# Patient Record
Sex: Female | Born: 1935
Health system: Southern US, Community
[De-identification: ages and names within clinical notes are randomized; demographics above are authoritative.]

## PROBLEM LIST (undated history)

## (undated) DIAGNOSIS — I639 Cerebral infarction, unspecified: Secondary | ICD-10-CM

## (undated) DIAGNOSIS — Z8673 Personal history of transient ischemic attack (TIA), and cerebral infarction without residual deficits: Secondary | ICD-10-CM

## (undated) DIAGNOSIS — E7800 Pure hypercholesterolemia, unspecified: Secondary | ICD-10-CM

## (undated) DIAGNOSIS — I10 Essential (primary) hypertension: Secondary | ICD-10-CM

## (undated) DIAGNOSIS — D0510 Intraductal carcinoma in situ of unspecified breast: Secondary | ICD-10-CM

## (undated) DIAGNOSIS — K219 Gastro-esophageal reflux disease without esophagitis: Secondary | ICD-10-CM

## (undated) HISTORY — DX: Personal history of transient ischemic attack (TIA), and cerebral infarction without residual deficits: Z86.73

## (undated) HISTORY — PX: ABDOMINAL HYSTERECTOMY: SHX81

## (undated) HISTORY — PX: BREAST LUMPECTOMY: SHX2

## (undated) HISTORY — DX: Intraductal carcinoma in situ of unspecified breast: D05.10

---

## 2001-06-15 ENCOUNTER — Encounter (HOSPITAL_COMMUNITY): Admission: RE | Admit: 2001-06-15 | Discharge: 2001-07-15 | Payer: Self-pay | Admitting: Oncology

## 2001-06-15 ENCOUNTER — Encounter: Admission: RE | Admit: 2001-06-15 | Discharge: 2001-06-15 | Payer: Self-pay | Admitting: Oncology

## 2001-10-25 ENCOUNTER — Other Ambulatory Visit: Admission: RE | Admit: 2001-10-25 | Discharge: 2001-10-25 | Payer: Self-pay | Admitting: Family Medicine

## 2001-10-27 ENCOUNTER — Ambulatory Visit (HOSPITAL_COMMUNITY): Admission: RE | Admit: 2001-10-27 | Discharge: 2001-10-27 | Payer: Self-pay | Admitting: Family Medicine

## 2001-10-27 ENCOUNTER — Encounter: Payer: Self-pay | Admitting: Family Medicine

## 2002-04-26 ENCOUNTER — Ambulatory Visit (HOSPITAL_COMMUNITY): Admission: RE | Admit: 2002-04-26 | Discharge: 2002-04-26 | Payer: Self-pay | Admitting: Ophthalmology

## 2002-05-17 ENCOUNTER — Ambulatory Visit (HOSPITAL_COMMUNITY): Admission: RE | Admit: 2002-05-17 | Discharge: 2002-05-17 | Payer: Self-pay | Admitting: Ophthalmology

## 2002-06-13 ENCOUNTER — Encounter: Admission: RE | Admit: 2002-06-13 | Discharge: 2002-06-13 | Payer: Self-pay | Admitting: Oncology

## 2002-06-13 ENCOUNTER — Encounter (HOSPITAL_COMMUNITY): Admission: RE | Admit: 2002-06-13 | Discharge: 2002-07-13 | Payer: Self-pay | Admitting: Oncology

## 2002-12-06 ENCOUNTER — Ambulatory Visit (HOSPITAL_COMMUNITY): Admission: RE | Admit: 2002-12-06 | Discharge: 2002-12-06 | Payer: Self-pay | Admitting: Family Medicine

## 2002-12-06 ENCOUNTER — Encounter: Payer: Self-pay | Admitting: Family Medicine

## 2003-01-05 ENCOUNTER — Encounter: Payer: Self-pay | Admitting: Family Medicine

## 2003-01-05 ENCOUNTER — Ambulatory Visit (HOSPITAL_COMMUNITY): Admission: RE | Admit: 2003-01-05 | Discharge: 2003-01-05 | Payer: Self-pay | Admitting: Family Medicine

## 2003-06-02 ENCOUNTER — Encounter (HOSPITAL_COMMUNITY): Admission: RE | Admit: 2003-06-02 | Discharge: 2003-07-02 | Payer: Self-pay | Admitting: Oncology

## 2003-06-02 ENCOUNTER — Encounter: Admission: RE | Admit: 2003-06-02 | Discharge: 2003-06-02 | Payer: Self-pay | Admitting: Oncology

## 2003-12-11 ENCOUNTER — Ambulatory Visit (HOSPITAL_COMMUNITY): Admission: RE | Admit: 2003-12-11 | Discharge: 2003-12-11 | Payer: Self-pay | Admitting: Family Medicine

## 2004-04-29 ENCOUNTER — Ambulatory Visit (HOSPITAL_COMMUNITY): Admission: RE | Admit: 2004-04-29 | Discharge: 2004-04-29 | Payer: Self-pay | Admitting: Family Medicine

## 2004-06-19 ENCOUNTER — Encounter: Admission: RE | Admit: 2004-06-19 | Discharge: 2004-07-19 | Payer: Self-pay | Admitting: Oncology

## 2004-06-19 ENCOUNTER — Encounter (HOSPITAL_COMMUNITY): Admission: RE | Admit: 2004-06-19 | Discharge: 2004-07-19 | Payer: Self-pay | Admitting: Oncology

## 2005-01-13 ENCOUNTER — Ambulatory Visit (HOSPITAL_COMMUNITY): Admission: RE | Admit: 2005-01-13 | Discharge: 2005-01-13 | Payer: Self-pay | Admitting: Family Medicine

## 2005-06-18 ENCOUNTER — Encounter (HOSPITAL_COMMUNITY): Admission: RE | Admit: 2005-06-18 | Discharge: 2005-07-18 | Payer: Self-pay | Admitting: Oncology

## 2005-06-18 ENCOUNTER — Encounter: Admission: RE | Admit: 2005-06-18 | Discharge: 2005-07-18 | Payer: Self-pay | Admitting: Oncology

## 2005-06-18 ENCOUNTER — Ambulatory Visit (HOSPITAL_COMMUNITY): Payer: Self-pay | Admitting: Oncology

## 2006-01-16 ENCOUNTER — Ambulatory Visit (HOSPITAL_COMMUNITY): Admission: RE | Admit: 2006-01-16 | Discharge: 2006-01-16 | Payer: Self-pay | Admitting: Family Medicine

## 2006-06-17 ENCOUNTER — Encounter: Admission: RE | Admit: 2006-06-17 | Discharge: 2006-07-17 | Payer: Self-pay | Admitting: Oncology

## 2006-06-17 ENCOUNTER — Ambulatory Visit (HOSPITAL_COMMUNITY): Payer: Self-pay | Admitting: Oncology

## 2007-02-02 ENCOUNTER — Encounter (HOSPITAL_COMMUNITY): Admission: RE | Admit: 2007-02-02 | Discharge: 2007-03-04 | Payer: Self-pay | Admitting: Oncology

## 2007-06-16 ENCOUNTER — Ambulatory Visit (HOSPITAL_COMMUNITY): Payer: Self-pay | Admitting: Oncology

## 2008-02-03 ENCOUNTER — Ambulatory Visit (HOSPITAL_COMMUNITY): Admission: RE | Admit: 2008-02-03 | Discharge: 2008-02-03 | Payer: Self-pay | Admitting: Family Medicine

## 2008-06-09 ENCOUNTER — Ambulatory Visit (HOSPITAL_COMMUNITY): Payer: Self-pay | Admitting: Oncology

## 2008-12-29 ENCOUNTER — Ambulatory Visit (HOSPITAL_COMMUNITY): Admission: RE | Admit: 2008-12-29 | Discharge: 2008-12-29 | Payer: Self-pay | Admitting: Family Medicine

## 2009-03-05 ENCOUNTER — Ambulatory Visit (HOSPITAL_COMMUNITY): Admission: RE | Admit: 2009-03-05 | Discharge: 2009-03-05 | Payer: Self-pay | Admitting: Family Medicine

## 2009-03-08 ENCOUNTER — Ambulatory Visit (HOSPITAL_COMMUNITY): Admission: RE | Admit: 2009-03-08 | Discharge: 2009-03-08 | Payer: Self-pay | Admitting: Family Medicine

## 2009-06-08 ENCOUNTER — Ambulatory Visit (HOSPITAL_COMMUNITY): Payer: Self-pay | Admitting: Oncology

## 2009-06-08 ENCOUNTER — Encounter (HOSPITAL_COMMUNITY): Admission: RE | Admit: 2009-06-08 | Discharge: 2009-07-08 | Payer: Self-pay | Admitting: Oncology

## 2010-03-26 ENCOUNTER — Ambulatory Visit (HOSPITAL_COMMUNITY): Admission: RE | Admit: 2010-03-26 | Discharge: 2010-03-26 | Payer: Self-pay | Admitting: Family Medicine

## 2010-06-07 ENCOUNTER — Ambulatory Visit (HOSPITAL_COMMUNITY): Payer: Self-pay | Admitting: Oncology

## 2010-11-11 ENCOUNTER — Encounter: Payer: Self-pay | Admitting: Family Medicine

## 2011-02-18 ENCOUNTER — Other Ambulatory Visit (HOSPITAL_COMMUNITY): Payer: Self-pay | Admitting: Family Medicine

## 2011-02-18 ENCOUNTER — Ambulatory Visit (HOSPITAL_COMMUNITY)
Admission: RE | Admit: 2011-02-18 | Discharge: 2011-02-18 | Disposition: A | Discharge status: Home / Self Care | Payer: Medicare Other | Source: Ambulatory Visit | Attending: Family Medicine | Admitting: Family Medicine

## 2011-02-18 DIAGNOSIS — R531 Weakness: Secondary | ICD-10-CM

## 2011-02-18 DIAGNOSIS — G319 Degenerative disease of nervous system, unspecified: Secondary | ICD-10-CM | POA: Insufficient documentation

## 2011-02-18 DIAGNOSIS — Z853 Personal history of malignant neoplasm of breast: Secondary | ICD-10-CM | POA: Insufficient documentation

## 2011-02-18 DIAGNOSIS — R209 Unspecified disturbances of skin sensation: Secondary | ICD-10-CM | POA: Insufficient documentation

## 2011-02-18 DIAGNOSIS — E119 Type 2 diabetes mellitus without complications: Secondary | ICD-10-CM | POA: Insufficient documentation

## 2011-02-19 ENCOUNTER — Ambulatory Visit (HOSPITAL_COMMUNITY)
Admission: RE | Admit: 2011-02-19 | Discharge: 2011-02-19 | Disposition: A | Discharge status: Home / Self Care | Payer: Medicare Other | Source: Ambulatory Visit | Attending: Family Medicine | Admitting: Family Medicine

## 2011-02-19 ENCOUNTER — Other Ambulatory Visit (HOSPITAL_COMMUNITY): Payer: Self-pay | Admitting: Family Medicine

## 2011-02-19 DIAGNOSIS — G459 Transient cerebral ischemic attack, unspecified: Secondary | ICD-10-CM

## 2011-02-19 DIAGNOSIS — E119 Type 2 diabetes mellitus without complications: Secondary | ICD-10-CM | POA: Insufficient documentation

## 2011-04-29 ENCOUNTER — Emergency Department (HOSPITAL_COMMUNITY): Payer: Medicare Other

## 2011-04-29 ENCOUNTER — Other Ambulatory Visit: Payer: Self-pay

## 2011-04-29 ENCOUNTER — Emergency Department (HOSPITAL_COMMUNITY)
Admission: EM | Admit: 2011-04-29 | Discharge: 2011-04-29 | Disposition: A | Discharge status: Home / Self Care | Payer: Medicare Other | Attending: Emergency Medicine | Admitting: Emergency Medicine

## 2011-04-29 ENCOUNTER — Encounter: Payer: Self-pay | Admitting: *Deleted

## 2011-04-29 DIAGNOSIS — I10 Essential (primary) hypertension: Secondary | ICD-10-CM | POA: Insufficient documentation

## 2011-04-29 DIAGNOSIS — E119 Type 2 diabetes mellitus without complications: Secondary | ICD-10-CM | POA: Insufficient documentation

## 2011-04-29 DIAGNOSIS — N390 Urinary tract infection, site not specified: Secondary | ICD-10-CM | POA: Insufficient documentation

## 2011-04-29 HISTORY — DX: Essential (primary) hypertension: I10

## 2011-04-29 HISTORY — DX: Gastro-esophageal reflux disease without esophagitis: K21.9

## 2011-04-29 HISTORY — DX: Pure hypercholesterolemia, unspecified: E78.00

## 2011-04-29 LAB — BASIC METABOLIC PANEL
Creatinine, Ser: 1.03 mg/dL (ref 0.50–1.10)
GFR calc Af Amer: >60 mL/min (ref >60)
Glucose, Bld: 161 mg/dL — ABNORMAL HIGH (ref 70–99)
Sodium: 137 mEq/L (ref 135–145)

## 2011-04-29 LAB — URINE MICROSCOPIC-ADD ON

## 2011-04-29 LAB — CBC
HCT: 36.2 % (ref 36.0–46.0)
MCHC: 33.7 g/dL (ref 30.0–36.0)
MCV: 87.7 fL (ref 78.0–100.0)
RDW: 13.3 % (ref 11.5–15.5)

## 2011-04-29 LAB — URINALYSIS, ROUTINE W REFLEX MICROSCOPIC
Hgb urine dipstick: NEGATIVE
Protein, ur: NEGATIVE mg/dL

## 2011-04-29 LAB — CK TOTAL AND CKMB (NOT AT ARMC): CK, MB: 2 ng/mL (ref 0.3–4.0)

## 2011-04-29 LAB — TROPONIN I: Troponin I: <0.3 ng/mL (ref <0.30)

## 2011-04-29 MED ORDER — DEXTROSE 5 % IV SOLN
INTRAVENOUS | Status: AC
  Filled 2011-04-29: qty 1

## 2011-04-29 MED ORDER — CEPHALEXIN 500 MG PO CAPS: 500.0000 mg | ORAL_CAPSULE | Freq: Four times a day (QID) | ORAL | Status: AC

## 2011-04-29 MED ORDER — DEXTROSE 5 % IV SOLN
1.0000 g | Freq: Once | INTRAVENOUS | Status: AC
  Administered 2011-04-29: 1 g via INTRAVENOUS
  Filled 2011-04-29: qty 1

## 2011-04-29 NOTE — ED Notes (Signed)
Pt reports waking this a.m with a "haze" over both eyes.  Pt states that "things look darker than usual".  Pt also reports some dizziness.  Pt is alert & oriented x 4, denies any weakness in extremities.  Neuro exam was WNL.

## 2011-04-29 NOTE — ED Provider Notes (Signed)
History     Chief Complaint  Patient presents with  . Fatigue    c/o generalized weakness and dimming of vision onset this morning upon waking   The history is provided by the patient. No language interpreter was used.  Referred to ED by Dr. Renard Matter. Patient c/o fatigue and weakness with associated numbness and blurred vision onset this AM after awaking and gradually worsening throughout the day today. States she awoke this morning "not feeling right" and with "foggy vision" but notes vision has gradually improved throughout the day and is near normal at this time. States she felt fine before going to bed last night. Notes nothing aggravates or relieves fatigue/weakness. Patient was seen at Dr. Renard Matter' office today and had blood glucose measure of 133, hemoglobin- 13.3 and BP-130/80.  Patient reports h/o stroke and diabetes. Reports she is not currently on any blood thinners. Denies chest pain, SOB, vomiting, fever, diaphoresis, neck/back pain, falls, abdominal pain Patient reports she has had about 3 episodes similar to this in the past several months  Patient seen at 4:54PM Past Medical History  Diagnosis Date  . Hypertension   . Diabetes mellitus   . GERD (gastroesophageal reflux disease)   . High cholesterol     Past Surgical History  Procedure Date  . Abdominal hysterectomy   . Cesarean section   . Breast lumpectomy     No family history on file.  History  Substance Use Topics  . Smoking status: Never Smoker   . Smokeless tobacco: Not on file  . Alcohol Use: No    OB History    Grav Para Term Preterm Abortions TAB SAB Ect Mult Living                  Review of Systems  Constitutional: Positive for fatigue. Negative for fever and chills.  HENT: Negative for neck pain.   Eyes: Positive for visual disturbance. Negative for pain.  Respiratory: Negative for shortness of breath.   Cardiovascular: Negative for chest pain.  Gastrointestinal: Negative for abdominal pain.   Musculoskeletal: Negative for back pain.  Neurological: Positive for weakness and numbness.  All other systems reviewed and are negative.    Physical Exam  BP 186/68  Pulse 83  Temp(Src) 98.5 F (36.9 C) (Oral)  Resp 20  Ht 5\' 5"  (1.651 m)  Wt 180 lb (81.647 kg)  BMI 29.95 kg/m2  SpO2 80%  Physical Exam CONSTITUTIONAL: Well developed/well nourished HEAD AND FACE: Normocephalic/atraumatic EYES: EOMI/PERRL, conjunctiva normal, peripheral vision intact ENMT: Mucous membranes moist NECK: supple no meningeal signs SPINE:entire spine nontender CV: S1/S2 noted, no murmurs/rubs/gallops noted LUNGS: Lungs are clear to auscultation bilaterally, no apparent distress ABDOMEN: soft, nontender, no rebound or guarding GU:no cva tenderness NEURO: Pt is awake/alert, moves all extremitiesx4, no facial droop, no pronator drift, face is symmetric, no arm or leg drift is noted Cranial nerves 3/4/5/6/04/27/09/11/12 tested and intact EXTREMITIES: pulses normal-DP and PT, full ROM SKIN: warm, color normal PSYCH: no abnormalities of mood noted  ED Course  Procedures Recheck-6:30 PM Patient with no new complaints. Labs and imaging results reviewed with patient.  MDM Nursing notes reviewed and considered in documentation All labs/vitals reviewed and considered xrays reviewed and considered EKG: normal sinus rhythm, nonspecific ST and T waves changes, left axis deviation, rate 80, time at 1737.  Dr Sudie Bailey here and saw patient We offered pt admission and further workup but she refuses.  She will f/u with dr Renard Matter She reports she can  take PCN, rocephin/keflex given She is well appearing, no focal motor deficits, she reports she is ambulatory  I personally performed the services described in this documentation, which was scribed in my presence. The recorded information has been reviewed and considered.      Joya Gaskins, MD 04/30/11 [phone]

## 2011-04-29 NOTE — ED Notes (Signed)
[id number] 

## 2011-04-29 NOTE — ED Notes (Signed)
C/o generalized weakness and darkening of her vision onset upon waking this morning; was seen by Dr. Renard Matter and sent to ED for further evaluation

## 2011-04-30 NOTE — Progress Notes (Signed)
NAME:  ARTHURINE, OLEARY                   ACCOUNT NO.:  MEDICAL RECORD NO.:  [id number]  LOCATION:                                 FACILITY:  PHYSICIAN:  Mila Homer. Sudie Bailey, M.D.DATE OF BIRTH:  February 03, 1933  DATE OF PROCEDURE: DATE OF DISCHARGE:                                PROGRESS NOTE   SUBJECTIVE:  This 77 year old patient of Dr. Butch Penny developed feelings of decreased vision with things going dark in front of her eyes as well as generalized weakness starting this morning.  She was seen by Dr. Renard Matter in the office and then transferred to the ER.  She has had episodes of this two or three times.  Each time it was resolved.  Workup so far has been negative.  She currently lives by herself.  Her husband died a couple years ago. She does have a history of strokes.  She has five children, most of whom live close by.  By the time she came to the emergency department she was already feeling stronger.  OBJECTIVE:  VITAL SIGNS:  Temperature is 98.5, pulse 84, respiratory rate 20, blood pressure was 141/47 and then rechecked 186/68, then 149/69.  The O2 sat was 98% on room air but was 80% initially. GENERAL:  She was at, the time of my exam, supine in bed.  She was well- developed, well-nourished, oriented and alert.  Her speech was normal and sentence structure intact.  She had two daughters in the room with her. HEART:  Regular rhythm, rate of 70. LUNGS:  Clear throughout. ABDOMEN:  Soft without organomegaly or mass. BACK:  She had some tenderness on the left CVA but not the right CVA. EXTREMITIES:  There was no edema of the ankles.  Her admission white cell count was 8800, hemoglobin 12.2, BUN 19, creatinine 1.03, CK-MB of 2.0 and total CK of 66 with troponin less than 0.30.  Platelet count 205,000.  Recheck glucose was 161.  Her urinalysis showed negative nitrite, small leukocytes but 11 to 20 WBCs per HPF and many bacteria.  CT of the head without contrast showed  mild atrophy and chronic ischemic white matter disease with multiple old infarcts.  She had no acute intracranial abnormality.  She had no change in these findings compared to her prior CT, which was May 2012.  Chest x-ray showed no acute abnormality.  Further review of the record showed carotid ultrasound showing minimal patchy right carotid bifurcation, calcific atherosclerosis in the right carotid artery, but there was no hemodynamically significant right ICA stenosis.  There were similar findings in the left carotid artery.  This study was done May 2012.  ASSESSMENT: 1. Urinary tract infection. 2. Weakness, uncertain etiology. 3. History of multiple strokes. 4. Diabetes.  PLAN:  She is getting a gram of Rocephin today and then will be on cephalexin p.o. after that.  She will follow up with Dr. Renard Matter.  She may need further workup including cardiac echo, or neurology workup given some of her symptoms.     Mila Homer. Sudie Bailey, M.D.     SDK/MEDQ  D:  04/29/2011  T:  04/30/2011  Job:  [id number]

## 2011-06-05 ENCOUNTER — Other Ambulatory Visit (HOSPITAL_COMMUNITY): Payer: Self-pay | Admitting: Family Medicine

## 2011-06-05 DIAGNOSIS — Z139 Encounter for screening, unspecified: Secondary | ICD-10-CM

## 2011-06-06 ENCOUNTER — Ambulatory Visit (HOSPITAL_COMMUNITY): Payer: Self-pay | Admitting: Oncology

## 2011-06-12 ENCOUNTER — Ambulatory Visit (HOSPITAL_COMMUNITY)
Admission: RE | Admit: 2011-06-12 | Discharge: 2011-06-12 | Disposition: A | Discharge status: Home / Self Care | Payer: Medicare Other | Source: Ambulatory Visit | Attending: Family Medicine | Admitting: Family Medicine

## 2011-06-12 DIAGNOSIS — Z139 Encounter for screening, unspecified: Secondary | ICD-10-CM

## 2011-06-12 DIAGNOSIS — Z1231 Encounter for screening mammogram for malignant neoplasm of breast: Secondary | ICD-10-CM | POA: Insufficient documentation

## 2011-06-16 ENCOUNTER — Encounter (HOSPITAL_COMMUNITY): Payer: Self-pay | Admitting: Oncology

## 2011-06-16 ENCOUNTER — Encounter (HOSPITAL_COMMUNITY): Payer: Medicare Other | Attending: Oncology | Admitting: Oncology

## 2011-06-16 VITALS — BP 168/81 | HR 82 | Temp 97.9°F | Wt 184.2 lb

## 2011-06-16 DIAGNOSIS — C50919 Malignant neoplasm of unspecified site of unspecified female breast: Secondary | ICD-10-CM

## 2011-06-16 DIAGNOSIS — F329 Major depressive disorder, single episode, unspecified: Secondary | ICD-10-CM

## 2011-06-16 DIAGNOSIS — F411 Generalized anxiety disorder: Secondary | ICD-10-CM

## 2011-06-16 NOTE — Progress Notes (Signed)
This office note has been dictated.

## 2011-06-16 NOTE — Patient Instructions (Signed)
[facility]  Discharge Instructions  RECOMMENDATIONS MADE BY THE CONSULTANT AND ANY TEST RESULTS WILL BE SENT TO YOUR REFERRING DOCTOR.   EXAM FINDINGS BY MD TODAY AND SIGNS AND SYMPTOMS TO REPORT TO CLINIC OR PRIMARY MD:  You must get out and walk everyday that there is nice weather. Please get engaged with friends. Please eat healthy foods. We want you moving and eating healthy. If you are not feeling better mentally/emotionally in 6-8 weeks then you must call Dr. Mariel Sleet or Dr. Renard Matter and let us know. Please tell Dr. Renard Matter how you are feeling like you told Dr. Mariel Sleet today. We will otherwise see you in 1 year.    I acknowledge that I have been informed and understand all the instructions given to me and received a copy. I do not have any more questions at this time, but understand that I may call the Specialty Clinic at [facility] at [phone] during business hours should I have any further questions or need assistance in obtaining follow-up care.    __________________________________________  _____________  __________ Signature of Patient or Authorized Representative            Date                   Time    __________________________________________ Nurse's Signature

## 2011-08-01 NOTE — Progress Notes (Signed)
CC:   Angus G. Renard Matter, MD Barbaraann Barthel, M.D. [facility] Dr. Antony Blackbird  DIAGNOSES: 1. Ductal carcinoma in situ of the left breast, status post lumpectomy     followed by radiation therapy in December 1997.  Thus far without     recurrence. 2. Depression, reactive, secondary to the death of her husband 03-14-2010. 3. Obesity, weighing 184 pounds now. 4. Diabetes mellitus. 5. Mild peripheral neuropathy from her diabetes. 6. Hypercholesterolemia. 7. Degenerative joint disease. 8. Hypertension. 9. Two transient ischemic attacks in the past.  [place] is still living alone.  She gets out so rarely.  When she does leave the house and drive, she states the most the time she gets anxious.  She states she had to take her nerve pill just before coming here.  She maybe goes out once a week if she has to.  She likes to avoid going out.  She does not have any hobbies, does not have any really good friends that she visits on a regular basis or that visit her.  She has been to church 3 times, she states, since her husband died and she used to be a regular attendee.  She has gained 4 pounds.  She sits and watches TV almost all day.  She falls asleep watching TV.  She states she still gets 6 or 7 hours of sleep at night.  She is not necessarily constipated per se.  She does not have a lot of energy, though, and she talks about if she tried to garden she might not be able to get up because of her knees and hips being stiff.  She is very sad-looking at times and she started to cry when we talked about her husband.  She has 5 children, all of whom talk to her but not always on a regular basis.  One son comes and cuts her grass every 03-15-23 and he talks to her, of course.  She refuses to go to the beach on vacation with any of the children.  She does not really want to go on vacation.  She does not want to go visit them and does not go visit them for the most  part except one of her sons who has a bread route, and she will go see him once in awhile.  She is basically cooking foods that are either pre-prepared or canned. Does not cook anymore.  Her sole objective in the past was taking care of her husband and they were married for many, many years, over 47, and they were best friends and so this has been very, very difficult for her.  Her oncologic review of systems is noncontributory.  PHYSICAL EXAMINATION:  Vital signs:  Her weight is up 4 pounds to 184 pounds.  Her blood pressure is slightly high at 168/81.  She is afebrile, pulse 80 and regular, respiratory rate 16 unlabored.  General: She is in no acute distress but she does looks sad.  She has no lymphadenopathy.  Both breasts are negative for masses.  Lungs:  Clear to auscultation and percussion.  Heart:  A regular rhythm and rate without murmur, rub or gallop.  Abdomen:  Soft, nontender, without organomegaly or masses, and there is no arm or leg edema.  I have asked her to talk things over with Dr. Renard Matter, but right now I would like to just see how she does without an antidepressant for about 6-8 weeks and if she is  not feeling any better and not getting out and doing more, she promises to call.  Otherwise, we will see her in a year but we spent 45 minutes going over counseling and coordination of care, etc., and will see her back in a year but she promises to call.  She had some lab work in July which was excellent.  I do not think I need to repeat that.  She states she is going to see Dr. Renard Matter on September 5, and he will have my note by then as well.    ______________________________ Ladona Horns. Mariel Sleet, MD ESN/MEDQ  D:  06/16/2011  T:  06/16/2011  Job:  [id number]

## 2012-06-11 ENCOUNTER — Ambulatory Visit (HOSPITAL_COMMUNITY): Payer: Medicare Other | Admitting: Oncology

## 2012-06-23 ENCOUNTER — Encounter (HOSPITAL_COMMUNITY): Payer: Medicare Other | Attending: Oncology | Admitting: Oncology

## 2012-06-23 ENCOUNTER — Encounter (HOSPITAL_COMMUNITY): Payer: Self-pay | Admitting: Oncology

## 2012-06-23 VITALS — BP 109/68 | HR 80 | Temp 97.9°F | Resp 16 | Ht 63.78 in | Wt 166.0 lb

## 2012-06-23 DIAGNOSIS — E119 Type 2 diabetes mellitus without complications: Secondary | ICD-10-CM | POA: Insufficient documentation

## 2012-06-23 DIAGNOSIS — F0390 Unspecified dementia without behavioral disturbance: Secondary | ICD-10-CM | POA: Insufficient documentation

## 2012-06-23 DIAGNOSIS — Z853 Personal history of malignant neoplasm of breast: Secondary | ICD-10-CM | POA: Insufficient documentation

## 2012-06-23 DIAGNOSIS — F4321 Adjustment disorder with depressed mood: Secondary | ICD-10-CM

## 2012-06-23 DIAGNOSIS — I10 Essential (primary) hypertension: Secondary | ICD-10-CM | POA: Insufficient documentation

## 2012-06-23 DIAGNOSIS — R413 Other amnesia: Secondary | ICD-10-CM

## 2012-06-23 DIAGNOSIS — Z09 Encounter for follow-up examination after completed treatment for conditions other than malignant neoplasm: Secondary | ICD-10-CM | POA: Insufficient documentation

## 2012-06-23 LAB — COMPREHENSIVE METABOLIC PANEL
ALT: 15 U/L (ref 0–35)
AST: 17 U/L (ref 0–37)
Alkaline Phosphatase: 63 U/L (ref 39–117)
CO2: 24 mEq/L (ref 19–32)
GFR calc Af Amer: 59 mL/min — ABNORMAL LOW (ref >90)
Glucose, Bld: 176 mg/dL — ABNORMAL HIGH (ref 70–99)
Potassium: 4.8 mEq/L (ref 3.5–5.1)
Sodium: 134 mEq/L — ABNORMAL LOW (ref 135–145)
Total Protein: 6.9 g/dL (ref 6.0–8.3)

## 2012-06-23 LAB — FOLATE: Folate: 17 ng/mL

## 2012-06-23 LAB — CBC
Hemoglobin: 11.7 g/dL — ABNORMAL LOW (ref 12.0–15.0)
MCHC: 34 g/dL (ref 30.0–36.0)
Platelets: 207 10*3/uL (ref 150–400)
RBC: 4 MIL/uL (ref 3.87–5.11)

## 2012-06-23 LAB — DIFFERENTIAL
Eosinophils Absolute: 0.2 10*3/uL (ref 0.0–0.7)
Eosinophils Relative: 3 % (ref 0–5)
Lymphocytes Relative: 38 % (ref 12–46)
Lymphs Abs: 2.6 10*3/uL (ref 0.7–4.0)
Monocytes Absolute: 0.5 10*3/uL (ref 0.1–1.0)

## 2012-06-23 LAB — VITAMIN B12: Vitamin B-12: 302 pg/mL (ref 211–911)

## 2012-06-23 NOTE — Progress Notes (Signed)
Problem #1 DCIS of the left breast status post lumpectomy followed by radiation therapy in December 1997 thus far without recurrence Problem #2 dementia Problem #3 reactive depression secondary to the death of her husband in June 04, 2011Problem #4 obesity in the past so she has lost from 184 pounds one year ago to 166 pounds presently Problem #5 diabetes mellitus Problem #6 history of mild peripheral neuropathy felt to be secondary to her diabetes Problem #7 hypercholesterolemia Problem #8 hypertension Problem #9 Two TIAs in the past Problem #10 DJD This lady is here today with her grandson who is now living with her. Her recent memory is tremendously different. I don't know if she has small vessel disease of the brain but she cannot remember many things whatsoever. She did know the president after a couple minutes. She did not note either of our [place] Senators, she cannot do serial sevens past 93, she cannot multiply 9x7 nor 5x4. She cannot remember 3 objects 5 minutes after our discussion.  Other than that she denies any lumps or bumps anywhere etc. She is still driving. She states she does not get lost. She states she can remember things from her past very easily. Vital signs except for the weight are stable. Lymph nodes are negative throughout. Breast exam is negative bilaterally for masses. Her lungs are clear. HEENT exam is unremarkable. Abdomen soft and nontender without organomegaly. Heart shows a regular rhythm and rate without murmur rub or gallop. She has no leg edema no arm edema. Bowel sounds are normal.  We will get some blood work today and get her to see Dr. Gerilyn Pilgrim consultation as soon as possible. I'm not sure she understands how serious this memory loss is. She makes light of this issue. Otherwise we will see her in one year

## 2012-06-23 NOTE — Patient Instructions (Addendum)
Rebecca Hardy  DOB 1932-12-30 CSN [id number]  MRN [id number] Dr. Glenford Peers   [facility]  Discharge Instructions  RECOMMENDATIONS MADE BY THE CONSULTANT AND ANY TEST RESULTS WILL BE SENT TO YOUR REFERRING DOCTOR.   EXAM FINDINGS BY MD TODAY AND SIGNS AND SYMPTOMS TO REPORT TO CLINIC OR PRIMARY MD: Exam and discussion by Dr. Mariel Sleet.  No evidence of recurrence by exam.  Need to check some blood work to see if there is anything that can account for your short-term memory loss.  We will also get you a consult to see a Neurologist about your memory loss.  MEDICATIONS PRESCRIBED: none   INSTRUCTIONS GIVEN AND DISCUSSED: Other :  Report any new lumps, bone pain or shortness of breath.  SPECIAL INSTRUCTIONS/FOLLOW-UP: Lab work Needed today and Return to Clinic in 1 year to see MD and referral to neurologist.   I acknowledge that I have been informed and understand all the instructions given to me and received a copy. I do not have any more questions at this time, but understand that I may call the Specialty Clinic at [facility] at [phone] during business hours should I have any further questions or need assistance in obtaining follow-up care.    __________________________________________  _____________  __________ Signature of Patient or Authorized Representative            Date                   Time    __________________________________________ Nurse's Signature

## 2012-07-12 ENCOUNTER — Other Ambulatory Visit: Payer: Self-pay | Admitting: Neurology

## 2012-07-12 ENCOUNTER — Other Ambulatory Visit (HOSPITAL_COMMUNITY): Payer: Self-pay | Admitting: Family Medicine

## 2012-07-12 DIAGNOSIS — Z139 Encounter for screening, unspecified: Secondary | ICD-10-CM

## 2012-07-15 ENCOUNTER — Ambulatory Visit (HOSPITAL_COMMUNITY): Payer: Medicare Other

## 2012-07-22 ENCOUNTER — Ambulatory Visit (HOSPITAL_COMMUNITY)
Admission: RE | Admit: 2012-07-22 | Discharge: 2012-07-22 | Disposition: A | Discharge status: Home / Self Care | Payer: Medicare Other | Source: Ambulatory Visit | Attending: Family Medicine | Admitting: Family Medicine

## 2012-07-22 DIAGNOSIS — Z139 Encounter for screening, unspecified: Secondary | ICD-10-CM

## 2012-07-22 DIAGNOSIS — Z1231 Encounter for screening mammogram for malignant neoplasm of breast: Secondary | ICD-10-CM | POA: Insufficient documentation

## 2013-04-04 ENCOUNTER — Other Ambulatory Visit (HOSPITAL_COMMUNITY): Payer: Self-pay | Admitting: Family Medicine

## 2013-04-04 DIAGNOSIS — R52 Pain, unspecified: Secondary | ICD-10-CM

## 2013-04-13 ENCOUNTER — Other Ambulatory Visit (HOSPITAL_COMMUNITY): Payer: Self-pay | Admitting: Family Medicine

## 2013-04-13 ENCOUNTER — Ambulatory Visit (HOSPITAL_COMMUNITY)
Admission: RE | Admit: 2013-04-13 | Discharge: 2013-04-13 | Disposition: A | Discharge status: Home / Self Care | Payer: Medicare Other | Source: Ambulatory Visit | Attending: Family Medicine | Admitting: Family Medicine

## 2013-04-13 DIAGNOSIS — R52 Pain, unspecified: Secondary | ICD-10-CM

## 2013-04-13 DIAGNOSIS — Z9889 Other specified postprocedural states: Secondary | ICD-10-CM

## 2013-04-13 DIAGNOSIS — N644 Mastodynia: Secondary | ICD-10-CM | POA: Insufficient documentation

## 2013-04-13 DIAGNOSIS — Z853 Personal history of malignant neoplasm of breast: Secondary | ICD-10-CM

## 2013-06-23 ENCOUNTER — Ambulatory Visit (HOSPITAL_COMMUNITY): Payer: Medicare Other | Admitting: Oncology

## 2013-07-01 ENCOUNTER — Ambulatory Visit (HOSPITAL_COMMUNITY): Payer: Medicare Other | Admitting: Oncology

## 2015-04-13 ENCOUNTER — Emergency Department (HOSPITAL_COMMUNITY)
Admission: EM | Admit: 2015-04-13 | Discharge: 2015-04-14 | Disposition: A | Discharge status: Home / Self Care | Payer: Medicare Other | Attending: Emergency Medicine | Admitting: Emergency Medicine

## 2015-04-13 ENCOUNTER — Encounter (HOSPITAL_COMMUNITY): Payer: Self-pay | Admitting: Emergency Medicine

## 2015-04-13 ENCOUNTER — Emergency Department (HOSPITAL_COMMUNITY): Payer: Medicare Other

## 2015-04-13 DIAGNOSIS — W19XXXA Unspecified fall, initial encounter: Secondary | ICD-10-CM

## 2015-04-13 DIAGNOSIS — S3992XA Unspecified injury of lower back, initial encounter: Secondary | ICD-10-CM | POA: Diagnosis present

## 2015-04-13 DIAGNOSIS — I10 Essential (primary) hypertension: Secondary | ICD-10-CM | POA: Insufficient documentation

## 2015-04-13 DIAGNOSIS — Z7982 Long term (current) use of aspirin: Secondary | ICD-10-CM | POA: Insufficient documentation

## 2015-04-13 DIAGNOSIS — Z8673 Personal history of transient ischemic attack (TIA), and cerebral infarction without residual deficits: Secondary | ICD-10-CM | POA: Diagnosis not present

## 2015-04-13 DIAGNOSIS — Y9301 Activity, walking, marching and hiking: Secondary | ICD-10-CM | POA: Diagnosis not present

## 2015-04-13 DIAGNOSIS — Y998 Other external cause status: Secondary | ICD-10-CM | POA: Diagnosis not present

## 2015-04-13 DIAGNOSIS — Z79899 Other long term (current) drug therapy: Secondary | ICD-10-CM | POA: Diagnosis not present

## 2015-04-13 DIAGNOSIS — E1165 Type 2 diabetes mellitus with hyperglycemia: Secondary | ICD-10-CM | POA: Insufficient documentation

## 2015-04-13 DIAGNOSIS — Z880 Allergy status to penicillin: Secondary | ICD-10-CM | POA: Insufficient documentation

## 2015-04-13 DIAGNOSIS — K219 Gastro-esophageal reflux disease without esophagitis: Secondary | ICD-10-CM | POA: Diagnosis not present

## 2015-04-13 DIAGNOSIS — Y92002 Bathroom of unspecified non-institutional (private) residence single-family (private) house as the place of occurrence of the external cause: Secondary | ICD-10-CM | POA: Diagnosis not present

## 2015-04-13 DIAGNOSIS — R739 Hyperglycemia, unspecified: Secondary | ICD-10-CM

## 2015-04-13 DIAGNOSIS — Z86018 Personal history of other benign neoplasm: Secondary | ICD-10-CM | POA: Insufficient documentation

## 2015-04-13 DIAGNOSIS — W1839XA Other fall on same level, initial encounter: Secondary | ICD-10-CM | POA: Diagnosis not present

## 2015-04-13 LAB — CBC WITH DIFFERENTIAL/PLATELET
BASOS PCT: 0 % (ref 0–1)
Basophils Absolute: 0 10*3/uL (ref 0.0–0.1)
EOS ABS: 0.3 10*3/uL (ref 0.0–0.7)
EOS PCT: 3 % (ref 0–5)
HEMATOCRIT: 37.9 % (ref 36.0–46.0)
HEMOGLOBIN: 12.9 g/dL (ref 12.0–15.0)
Lymphocytes Relative: 38 % (ref 12–46)
Lymphs Abs: 3.1 10*3/uL (ref 0.7–4.0)
MCH: 29.9 pg (ref 26.0–34.0)
MCHC: 34 g/dL (ref 30.0–36.0)
MCV: 87.7 fL (ref 78.0–100.0)
MONO ABS: 0.7 10*3/uL (ref 0.1–1.0)
MONOS PCT: 9 % (ref 3–12)
Neutro Abs: 4 10*3/uL (ref 1.7–7.7)
Neutrophils Relative %: 50 % (ref 43–77)
Platelets: 162 10*3/uL (ref 150–400)
RBC: 4.32 MIL/uL (ref 3.87–5.11)
RDW: 12.7 % (ref 11.5–15.5)
WBC: 8.1 10*3/uL (ref 4.0–10.5)

## 2015-04-13 LAB — BASIC METABOLIC PANEL
Anion gap: 10 (ref 5–15)
BUN: 20 mg/dL (ref 6–20)
CO2: 25 mmol/L (ref 22–32)
Calcium: 9 mg/dL (ref 8.9–10.3)
Chloride: 100 mmol/L — ABNORMAL LOW (ref 101–111)
Creatinine, Ser: 1.13 mg/dL — ABNORMAL HIGH (ref 0.44–1.00)
GFR calc Af Amer: 52 mL/min — ABNORMAL LOW (ref >60)
GFR, EST NON AFRICAN AMERICAN: 45 mL/min — AB (ref >60)
Glucose, Bld: 402 mg/dL — ABNORMAL HIGH (ref 65–99)
Potassium: 4 mmol/L (ref 3.5–5.1)
SODIUM: 135 mmol/L (ref 135–145)

## 2015-04-13 LAB — CBG MONITORING, ED: Glucose-Capillary: 247 mg/dL — ABNORMAL HIGH (ref 65–99)

## 2015-04-13 MED ORDER — METFORMIN HCL 500 MG PO TABS
1000.0000 mg | ORAL_TABLET | Freq: Once | ORAL | Status: AC
  Administered 2015-04-13: 1000 mg via ORAL
  Filled 2015-04-13: qty 2

## 2015-04-13 MED ORDER — METFORMIN HCL ER 500 MG PO TB24
ORAL_TABLET | ORAL | Status: AC
  Filled 2015-04-13: qty 2

## 2015-04-13 MED ORDER — INSULIN ASPART 100 UNIT/ML ~~LOC~~ SOLN
8.0000 [IU] | Freq: Once | SUBCUTANEOUS | Status: AC
  Administered 2015-04-13: 8 [IU] via SUBCUTANEOUS
  Filled 2015-04-13: qty 1

## 2015-04-13 MED ORDER — GLIPIZIDE-METFORMIN HCL 5-500 MG PO TABS: 2.0000 | ORAL_TABLET | Freq: Two times a day (BID) | ORAL | Status: DC

## 2015-04-13 MED ORDER — GLIPIZIDE 5 MG PO TABS
ORAL_TABLET | ORAL | Status: AC
  Filled 2015-04-13: qty 2

## 2015-04-13 MED ORDER — FENTANYL CITRATE (PF) 100 MCG/2ML IJ SOLN
50.0000 ug | Freq: Once | INTRAMUSCULAR | Status: AC
  Administered 2015-04-13: 50 ug via INTRAVENOUS
  Filled 2015-04-13: qty 2

## 2015-04-13 MED ORDER — SODIUM CHLORIDE 0.9 % IV SOLN
INTRAVENOUS | Status: DC
  Administered 2015-04-13: 20:00:00 via INTRAVENOUS

## 2015-04-13 MED ORDER — GLIPIZIDE 10 MG PO TABS
10.0000 mg | ORAL_TABLET | Freq: Every day | ORAL | Status: DC
  Administered 2015-04-13: 10 mg via ORAL

## 2015-04-13 NOTE — Discharge Instructions (Signed)
Fall Prevention and Home Safety Falls cause injuries and can affect all age groups. It is possible to use preventive measures to significantly decrease the likelihood of falls. There are many simple measures which can make your home safer and prevent falls. OUTDOORS  Repair cracks and edges of walkways and driveways.  Remove high doorway thresholds.  Trim shrubbery on the main path into your home.  Have good outside lighting.  Clear walkways of tools, rocks, debris, and clutter.  Check that handrails are not broken and are securely fastened. Both sides of steps should have handrails.  Have leaves, snow, and ice cleared regularly.  Use sand or salt on walkways during winter months.  In the garage, clean up grease or oil spills. BATHROOM  Install night lights.  Install grab bars by the toilet and in the tub and shower.  Use non-skid mats or decals in the tub or shower.  Place a plastic non-slip stool in the shower to sit on, if needed.  Keep floors dry and clean up all water on the floor immediately.  Remove soap buildup in the tub or shower on a regular basis.  Secure bath mats with non-slip, double-sided rug tape.  Remove throw rugs and tripping hazards from the floors. BEDROOMS  Install night lights.  Make sure a bedside light is easy to reach.  Do not use oversized bedding.  Keep a telephone by your bedside.  Have a firm chair with side arms to use for getting dressed.  Remove throw rugs and tripping hazards from the floor. KITCHEN  Keep handles on pots and pans turned toward the center of the stove. Use back burners when possible.  Clean up spills quickly and allow time for drying.  Avoid walking on wet floors.  Avoid hot utensils and knives.  Position shelves so they are not too high or low.  Place commonly used objects within easy reach.  If necessary, use a sturdy step stool with a grab bar when reaching.  Keep electrical cables out of the  way.  Do not use floor polish or wax that makes floors slippery. If you must use wax, use non-skid floor wax.  Remove throw rugs and tripping hazards from the floor. STAIRWAYS  Never leave objects on stairs.  Place handrails on both sides of stairways and use them. Fix any loose handrails. Make sure handrails on both sides of the stairways are as long as the stairs.  Check carpeting to make sure it is firmly attached along stairs. Make repairs to worn or loose carpet promptly.  Avoid placing throw rugs at the top or bottom of stairways, or properly secure the rug with carpet tape to prevent slippage. Get rid of throw rugs, if possible.  Have an electrician put in a light switch at the top and bottom of the stairs. OTHER FALL PREVENTION TIPS  Wear low-heel or rubber-soled shoes that are supportive and fit well. Wear closed toe shoes.  When using a stepladder, make sure it is fully opened and both spreaders are firmly locked. Do not climb a closed stepladder.  Add color or contrast paint or tape to grab bars and handrails in your home. Place contrasting color strips on first and last steps.  Learn and use mobility aids as needed. Install an electrical emergency response system.  Turn on lights to avoid dark areas. Replace light bulbs that burn out immediately. Get light switches that glow.  Arrange furniture to create clear pathways. Keep furniture in the same place.  Firmly attach carpet with non-skid or double-sided tape.  Eliminate uneven floor surfaces.  Select a carpet pattern that does not visually hide the edge of steps.  Be aware of all pets. OTHER HOME SAFETY TIPS  Set the water temperature for 120 F (48.8 C).  Keep emergency numbers on or near the telephone.  Keep smoke detectors on every level of the home and near sleeping areas. Document Released: 09/26/2002 Document Revised: 04/06/2012 Document Reviewed: 12/26/2011 [facility] Patient Information 2015  [place], [place]. This information is not intended to replace advice given to you by your health care provider. Make sure you discuss any questions you have with your health care provider.  Hyperglycemia Hyperglycemia occurs when the glucose (sugar) in your blood is too high. Hyperglycemia can happen for many reasons, but it most often happens to people who do not know they have diabetes or are not managing their diabetes properly.  CAUSES  Whether you have diabetes or not, there are other causes of hyperglycemia. Hyperglycemia can occur when you have diabetes, but it can also occur in other situations that you might not be as aware of, such as: Diabetes  If you have diabetes and are having problems controlling your blood glucose, hyperglycemia could occur because of some of the following reasons:  Not following your meal plan.  Not taking your diabetes medications or not taking it properly.  Exercising less or doing less activity than you normally do.  Being sick. Pre-diabetes  This cannot be ignored. Before people develop Type 2 diabetes, they almost always have "pre-diabetes." This is when your blood glucose levels are higher than normal, but not yet high enough to be diagnosed as diabetes. Research has shown that some long-term damage to the body, especially the heart and circulatory system, may already be occurring during pre-diabetes. If you take action to manage your blood glucose when you have pre-diabetes, you may delay or prevent Type 2 diabetes from developing. Stress  If you have diabetes, you may be "diet" controlled or on oral medications or insulin to control your diabetes. However, you may find that your blood glucose is higher than usual in the hospital whether you have diabetes or not. This is often referred to as "stress hyperglycemia." Stress can elevate your blood glucose. This happens because of hormones put out by the body during times of stress. If stress has been the cause  of your high blood glucose, it can be followed regularly by your caregiver. That way he/she can make sure your hyperglycemia does not continue to get worse or progress to diabetes. Steroids  Steroids are medications that act on the infection fighting system (immune system) to block inflammation or infection. One side effect can be a rise in blood glucose. Most people can produce enough extra insulin to allow for this rise, but for those who cannot, steroids make blood glucose levels go even higher. It is not unusual for steroid treatments to "uncover" diabetes that is developing. It is not always possible to determine if the hyperglycemia will go away after the steroids are stopped. A special blood test called an A1c is sometimes done to determine if your blood glucose was elevated before the steroids were started. SYMPTOMS  Thirsty.  Frequent urination.  Dry mouth.  Blurred vision.  Tired or fatigue.  Weakness.  Sleepy.  Tingling in feet or leg. DIAGNOSIS  Diagnosis is made by monitoring blood glucose in one or all of the following ways:  A1c test. This is a chemical  found in your blood.  Fingerstick blood glucose monitoring.  Laboratory results. TREATMENT  First, knowing the cause of the hyperglycemia is important before the hyperglycemia can be treated. Treatment may include, but is not be limited to:  Education.  Change or adjustment in medications.  Change or adjustment in meal plan.  Treatment for an illness, infection, etc.  More frequent blood glucose monitoring.  Change in exercise plan.  Decreasing or stopping steroids.  Lifestyle changes. HOME CARE INSTRUCTIONS   Test your blood glucose as directed.  Exercise regularly. Your caregiver will give you instructions about exercise. Pre-diabetes or diabetes which comes on with stress is helped by exercising.  Eat wholesome, balanced meals. Eat often and at regular, fixed times. Your caregiver or nutritionist  will give you a meal plan to guide your sugar intake.  Being at an ideal weight is important. If needed, losing as little as 10 to 15 pounds may help improve blood glucose levels. SEEK MEDICAL CARE IF:   You have questions about medicine, activity, or diet.  You continue to have symptoms (problems such as increased thirst, urination, or weight gain). SEEK IMMEDIATE MEDICAL CARE IF:   You are vomiting or have diarrhea.  Your breath smells fruity.  You are breathing faster or slower.  You are very sleepy or incoherent.  You have numbness, tingling, or pain in your feet or hands.  You have chest pain.  Your symptoms get worse even though you have been following your caregiver's orders.  If you have any other questions or concerns. Document Released: 04/01/2001 Document Revised: 12/29/2011 Document Reviewed: 02/02/2012 [facility] Patient Information 2015 [place], [place]. This information is not intended to replace advice given to you by your health care provider. Make sure you discuss any questions you have with your health care provider.

## 2015-04-13 NOTE — ED Notes (Signed)
Patient reports fell last night. Complaining of pain to tailbone. States trouble getting up and walking.

## 2015-04-13 NOTE — ED Provider Notes (Signed)
CSN: [id number]     Arrival date & time 04/13/15  1919 History   First MD Initiated Contact with Patient 04/13/15 1927     Chief Complaint  Patient presents with  . Fall  . Tailbone Pain    HPI Patient presents to the emergency room with complaints of pain in her tailbone. The patient was walking last evening to the bathroom. Her legs became unsteady and she fell to the ground. Patient landed on her tailbone. Since that time she has had persistent pain. It is hard for her to get up and walk although she is able to do so and bear weight. She did not hit her head. She did not lose consciousness. She denies any trouble with chest pain or shortness of breath. She denies any numbness or weakness. Patient has not had any vomiting or diarrhea. No fevers or chills. She has not taken anything for the pain. Past Medical History  Diagnosis Date  . Hypertension   . Diabetes mellitus   . GERD (gastroesophageal reflux disease)   . High cholesterol   . DCIS (ductal carcinoma in situ) of breast     left breast  . History of TIA (transient ischemic attack)    Past Surgical History  Procedure Laterality Date  . Abdominal hysterectomy    . Cesarean section    . Breast lumpectomy     Family History  Problem Relation Age of Onset  . Cancer Mother   . Hypertension Father    History  Substance Use Topics  . Smoking status: Never Smoker   . Smokeless tobacco: Never Used  . Alcohol Use: No   OB History    No data available     Review of Systems  All other systems reviewed and are negative.     Allergies  Codeine and Penicillins  Home Medications   Prior to Admission medications   Medication Sig Start Date End Date Taking? Authorizing Provider  acetaminophen (TYLENOL) 650 MG CR tablet Take 650 mg by mouth every 8 (eight) hours as needed.    Historical Provider, MD  aspirin 81 MG tablet Take 81 mg by mouth once a week.     Historical Provider, MD  diazepam (VALIUM) 5 MG tablet Take 5 mg  by mouth every 6 (six) hours as needed.      Historical Provider, MD  ezetimibe-simvastatin (VYTORIN) 10-40 MG per tablet Take 1 tablet by mouth at bedtime.      Historical Provider, MD  glipiZIDE-metformin (METAGLIP) 5-500 MG per tablet Take 2 tablets by mouth 2 (two) times daily before a meal.      Historical Provider, MD  Liraglutide (VICTOZA [place]) Inject into the skin daily.      Historical Provider, MD  lisinopril (PRINIVIL,ZESTRIL) 10 MG tablet Take 10 mg by mouth daily.      Historical Provider, MD  omeprazole (PRILOSEC) 20 MG capsule Take 20 mg by mouth daily.     Historical Provider, MD  triamterene-hydrochlorothiazide (MAXZIDE) 75-50 MG per tablet Take 1 tablet by mouth as needed.     Historical Provider, MD   BP 116/60 mmHg  Pulse 77  Temp(Src) 98.7 F (37.1 C) (Oral)  Resp 13  Ht 5\' 5"  (1.651 m)  Wt 162 lb (73.483 kg)  BMI 26.96 kg/m2  SpO2 97% Physical Exam  Constitutional: No distress.  HENT:  Head: Normocephalic and atraumatic.  Right Ear: External ear normal.  Left Ear: External ear normal.  Eyes: Conjunctivae are normal. Right eye  exhibits no discharge. Left eye exhibits no discharge. No scleral icterus.  Neck: Neck supple. No tracheal deviation present.  Cardiovascular: Normal rate, regular rhythm and intact distal pulses.   Pulmonary/Chest: Effort normal and breath sounds normal. No stridor. No respiratory distress. She has no wheezes. She has no rales.  Abdominal: Soft. Bowel sounds are normal. She exhibits no distension. There is no tenderness. There is no rebound and no guarding.  Musculoskeletal: She exhibits no edema.       Right hip: Normal.       Left hip: Normal.       Cervical back: Normal.       Thoracic back: Normal.       Lumbar back: She exhibits tenderness and bony tenderness.  Tenderness palpation in the middle of the sacrum  Neurological: She is alert. She has normal strength. No cranial nerve deficit (no facial droop, extraocular movements intact,  no slurred speech) or sensory deficit. She exhibits normal muscle tone. She displays no seizure activity. Coordination normal.  5 out of 5 strength bilateral upper extremities and lower extremities, normal sensation, no facial droop  Skin: Skin is warm and dry. No rash noted.  Psychiatric: She has a normal mood and affect.  Nursing note and vitals reviewed.   ED Course  Procedures (including critical care time) Labs Review Labs Reviewed  BASIC METABOLIC PANEL - Abnormal; Notable for the following:    Chloride 100 (*)    Glucose, Bld 402 (*)    Creatinine, Ser 1.13 (*)    GFR calc non Af Amer 45 (*)    GFR calc Af Amer 52 (*)    All other components within normal limits  CBC WITH DIFFERENTIAL/PLATELET    Imaging Review Ct Pelvis Limited W/o Cm  04/13/2015   CLINICAL DATA:  Patient reports fall last night on the heart surface. Pain to tail bone.  EXAM: CT PELVIS WITHOUT CONTRAST  TECHNIQUE: Multidetector CT imaging of the pelvis was performed following the standard protocol without intravenous contrast.  COMPARISON:  None.  FINDINGS: The bones appear diffusely osteopenic. This diminishes ability to detect subtle nondisplaced fractures. There is mild facet degenerative change involving the lower lumbar spine. Mild lumbar scoliosis deformity is also noted. There is no displaced fractures identified. Both hips appear located and intact.  The urinary bladder appears normal. Aortic atherosclerosis noted. No enlarged pelvic lymph nodes. No free fluid identified. The pelvic bowel loops are unremarkable.  IMPRESSION: 1. No displaced fractures identified. 2. Osteopenia.   Electronically Signed   By: Kerby Moors M.D.   On: 04/13/2015 21:46     EKG Interpretation   Date/Time:  Friday April 13 2015 20:23:36 EDT Ventricular Rate:  77 PR Interval:  184 QRS Duration: 89 QT Interval:  401 QTC Calculation: 220 R Axis:   -21 Text Interpretation:  Sinus rhythm Borderline left axis deviation Low   voltage, extremity and precordial leads No significant change since last  tracing Confirmed by Oswin Johal  MD-J, Delorice Bannister ([id number]) on 04/13/2015 8:42:00 PM      MDM   Final diagnoses:  Fall  Hyperglycemia    Pt has not take her new diabetes medication in a week.  She was being switched from metalip to a new one but she could not afford the rx.  Will give fluids and insulin.  Giver her a dose of metaglip.  Have her follow up with her PCP to review her blood sugars.    Dorie Rank, MD 04/13/15 2213

## 2015-04-14 MED ORDER — TRAMADOL HCL 50 MG PO TABS: 50.0000 mg | ORAL_TABLET | Freq: Four times a day (QID) | ORAL | Status: DC | PRN

## 2015-04-14 NOTE — ED Provider Notes (Signed)
Family had left discharge instructions in the room. They called back to return to pick up the instructions but also state that the patient is having significant amount of pain and with like a pain prescription for her. It is noted that she is allergic to codeine, so she is given a prescription for tramadol.  Delora Fuel, MD 22/29/79 [id number]

## 2015-12-17 ENCOUNTER — Other Ambulatory Visit (HOSPITAL_COMMUNITY): Payer: Self-pay | Admitting: Internal Medicine

## 2015-12-17 DIAGNOSIS — R413 Other amnesia: Secondary | ICD-10-CM

## 2015-12-20 ENCOUNTER — Ambulatory Visit (HOSPITAL_COMMUNITY): Admission: RE | Admit: 2015-12-20 | Payer: Medicare Other | Source: Ambulatory Visit

## 2016-01-01 ENCOUNTER — Ambulatory Visit (HOSPITAL_COMMUNITY)
Admission: RE | Admit: 2016-01-01 | Discharge: 2016-01-01 | Disposition: A | Discharge status: Home / Self Care | Payer: Medicare Other | Source: Ambulatory Visit | Attending: Internal Medicine | Admitting: Internal Medicine

## 2016-01-01 DIAGNOSIS — F0390 Unspecified dementia without behavioral disturbance: Secondary | ICD-10-CM | POA: Diagnosis present

## 2016-01-01 DIAGNOSIS — R413 Other amnesia: Secondary | ICD-10-CM

## 2016-01-01 DIAGNOSIS — Z8673 Personal history of transient ischemic attack (TIA), and cerebral infarction without residual deficits: Secondary | ICD-10-CM | POA: Insufficient documentation

## 2016-01-01 DIAGNOSIS — R9082 White matter disease, unspecified: Secondary | ICD-10-CM | POA: Diagnosis not present

## 2016-01-01 DIAGNOSIS — G311 Senile degeneration of brain, not elsewhere classified: Secondary | ICD-10-CM | POA: Insufficient documentation

## 2016-05-21 ENCOUNTER — Encounter (HOSPITAL_COMMUNITY): Payer: Self-pay | Admitting: Emergency Medicine

## 2016-05-21 ENCOUNTER — Emergency Department (HOSPITAL_COMMUNITY)
Admission: EM | Admit: 2016-05-21 | Discharge: 2016-05-22 | Disposition: A | Discharge status: Home / Self Care | Payer: Medicare Other | Attending: Emergency Medicine | Admitting: Emergency Medicine

## 2016-05-21 ENCOUNTER — Emergency Department (HOSPITAL_COMMUNITY): Payer: Medicare Other

## 2016-05-21 DIAGNOSIS — N390 Urinary tract infection, site not specified: Secondary | ICD-10-CM

## 2016-05-21 DIAGNOSIS — E1165 Type 2 diabetes mellitus with hyperglycemia: Secondary | ICD-10-CM | POA: Insufficient documentation

## 2016-05-21 DIAGNOSIS — Z79899 Other long term (current) drug therapy: Secondary | ICD-10-CM | POA: Insufficient documentation

## 2016-05-21 DIAGNOSIS — Z794 Long term (current) use of insulin: Secondary | ICD-10-CM | POA: Diagnosis not present

## 2016-05-21 DIAGNOSIS — I10 Essential (primary) hypertension: Secondary | ICD-10-CM | POA: Diagnosis not present

## 2016-05-21 DIAGNOSIS — R531 Weakness: Secondary | ICD-10-CM | POA: Diagnosis not present

## 2016-05-21 DIAGNOSIS — R739 Hyperglycemia, unspecified: Secondary | ICD-10-CM

## 2016-05-21 LAB — CBC
HCT: 32.2 % — ABNORMAL LOW (ref 36.0–46.0)
HEMOGLOBIN: 11.1 g/dL — AB (ref 12.0–15.0)
MCH: 30.5 pg (ref 26.0–34.0)
MCHC: 34.5 g/dL (ref 30.0–36.0)
MCV: 88.5 fL (ref 78.0–100.0)
Platelets: 129 10*3/uL — ABNORMAL LOW (ref 150–400)
RBC: 3.64 MIL/uL — AB (ref 3.87–5.11)
RDW: 13.2 % (ref 11.5–15.5)
WBC: 6.7 10*3/uL (ref 4.0–10.5)

## 2016-05-21 LAB — BASIC METABOLIC PANEL
Anion gap: 6 (ref 5–15)
BUN: 30 mg/dL — ABNORMAL HIGH (ref 6–20)
CO2: 25 mmol/L (ref 22–32)
Calcium: 8.3 mg/dL — ABNORMAL LOW (ref 8.9–10.3)
Chloride: 99 mmol/L — ABNORMAL LOW (ref 101–111)
Creatinine, Ser: 1.49 mg/dL — ABNORMAL HIGH (ref 0.44–1.00)
GFR calc Af Amer: 37 mL/min — ABNORMAL LOW (ref >60)
GFR calc non Af Amer: 32 mL/min — ABNORMAL LOW (ref >60)
Glucose, Bld: 432 mg/dL — ABNORMAL HIGH (ref 65–99)
POTASSIUM: 4.1 mmol/L (ref 3.5–5.1)
SODIUM: 130 mmol/L — AB (ref 135–145)

## 2016-05-21 LAB — CBG MONITORING, ED: Glucose-Capillary: 433 mg/dL — ABNORMAL HIGH (ref 65–99)

## 2016-05-21 MED ORDER — INSULIN ASPART 100 UNIT/ML ~~LOC~~ SOLN
8.0000 [IU] | Freq: Once | SUBCUTANEOUS | Status: AC
  Administered 2016-05-21: 8 [IU] via SUBCUTANEOUS
  Filled 2016-05-21: qty 1

## 2016-05-21 MED ORDER — SODIUM CHLORIDE 0.9 % IV BOLUS (SEPSIS)
500.0000 mL | Freq: Once | INTRAVENOUS | Status: AC
  Administered 2016-05-21: 500 mL via INTRAVENOUS

## 2016-05-21 NOTE — ED Provider Notes (Signed)
[facility] DEPT Provider Note   CSN: [id number] Arrival date & time: 05/21/16  2157  First Provider Contact:  None   By signing my name below, I, Rebecca Hardy, attest that this documentation has been prepared under the direction and in the presence of No att. providers found . Electronically Signed: Dyke Hardy, Scribe. 05/22/2016. 9:16 AM.   History   Chief Complaint Chief Complaint  Patient presents with  . Hyperglycemia    HPI Rebecca Hardy is a 82 y.o. female wth PMHx of DMwho presents to the Emergency Department complaining of hyperglycemia onset tonight. Pt states her blood sugar was 433 tonight during routine check. Pt states she did not take Trujeo tonight. When asked if she takes her medication regularly, pt  "sometimes I forget" . Per family, pt had  Denies vomiting, nausea, diarrhea, couch, runny nose, sore throat, SOB chest pain, ab pain, fever, hematuria, dysuria, headache, or eye pain. Pt denies any PMHx of MI, heart failure, asthma, or COPD. Per daughter, her blood sugar typically runs any where from 150-220. She notes associated lightheadedness which has resolved and blurred vision.   Legs are baseline swollen and red HPI  Past Medical History:  Diagnosis Date  . 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 Diabetes mellitus   . GERD (gastroesophageal reflux disease)   . High cholesterol   . History of TIA (transient ischemic attack)   . Hypertension     There are no active problems to display for this patient.   Past Surgical History:  Procedure Laterality Date  . ABDOMINAL HYSTERECTOMY    . BREAST LUMPECTOMY    . CESAREAN SECTION      OB History    No data available       Home Medications    Prior to Admission medications   Medication Sig Start Date End Date Taking? Authorizing Provider  acetaminophen (TYLENOL) 650 MG CR tablet Take 650 mg by mouth every 8 (eight) hours as needed for pain.    Yes Historical Provider, MD    ezetimibe-simvastatin (VYTORIN) 10-40 MG per tablet Take 1 tablet by mouth at bedtime.     Yes Historical Provider, MD  furosemide (LASIX) 20 MG tablet Take 20 mg by mouth daily.   Yes Historical Provider, MD  lisinopril (PRINIVIL,ZESTRIL) 10 MG tablet Take 10 mg by mouth daily.     Yes Historical Provider, MD  sertraline (ZOLOFT) 25 MG tablet Take 25 mg by mouth daily. 03/27/15  Yes Historical Provider, MD  TOUJEO SOLOSTAR 300 UNIT/ML SOPN Inject 8-20 Units into the skin 2 (two) times daily. 20 units in the morning and 8 units at bedtime 05/21/16  Yes Historical Provider, MD  cephALEXin (KEFLEX) 500 MG capsule Take 1 capsule (500 mg total) by mouth 2 (two) times daily. 05/22/16   Ezequiel Essex, MD  pioglitazone (ACTOS) 15 MG tablet Take 15 mg by mouth daily.  05/19/16   Historical Provider, MD    Family History Family History  Problem Relation Age of Onset  . Cancer Mother   . Hypertension Father     Social History Social History  Substance Use Topics  . Smoking status: Never Smoker  . Smokeless tobacco: Never Used  . Alcohol use No     Allergies   Codeine and Penicillins   Review of Systems Review of Systems 10 Systems reviewed and are negative for acute change except as noted in the HPI.  Physical Exam Updated Vital  Signs BP (!) 104/35 (BP Location: Right Arm)   Pulse 80   Temp 99 F (37.2 C) (Oral)   Resp 17   Ht 5\' 5"  (1.651 m)   Wt 165 lb (74.8 kg)   SpO2 96%   BMI 27.46 kg/m   Physical Exam  Constitutional: She is oriented to person, place, and time. She appears well-developed and well-nourished. No distress.  HENT:  Head: Normocephalic and atraumatic.  Mouth/Throat: Oropharynx is clear and moist. Mucous membranes are dry. No oropharyngeal exudate.  Eyes: Conjunctivae and EOM are normal. Pupils are equal, round, and reactive to light.  Neck: Normal range of motion. Neck supple.  No meningismus.  Cardiovascular: Normal rate, regular rhythm, normal heart sounds  and intact distal pulses.   No murmur heard. Pulmonary/Chest: Effort normal and breath sounds normal. No respiratory distress.  Dry cough  Abdominal: Soft. There is no tenderness. There is no rebound and no guarding.  Musculoskeletal: Normal range of motion. She exhibits no edema or tenderness.  Trace pedal edema bilaterally  Neurological: She is alert and oriented to person, place, and time. No cranial nerve deficit. She exhibits normal muscle tone. Coordination normal.   5/5 strength throughout. CN 2-12 intact.Equal grip strength. No nystagmus.   Skin: Skin is warm.  Psychiatric: She has a normal mood and affect. Her behavior is normal.  Nursing note and vitals reviewed.   ED Treatments / Results  Labs (all labs ordered are listed, but only abnormal results are displayed) Labs Reviewed  BASIC METABOLIC PANEL - Abnormal; Notable for the following:       Result Value   Sodium 130 (*)    Chloride 99 (*)    Glucose, Bld 432 (*)    BUN 30 (*)    Creatinine, Ser 1.49 (*)    Calcium 8.3 (*)    GFR calc non Af Amer 32 (*)    GFR calc Af Amer 37 (*)    All other components within normal limits  CBC - Abnormal; Notable for the following:    RBC 3.64 (*)    Hemoglobin 11.1 (*)    HCT 32.2 (*)    Platelets 129 (*)    All other components within normal limits  URINALYSIS, ROUTINE W REFLEX MICROSCOPIC (NOT AT [facility]) - Abnormal; Notable for the following:    Color, Urine STRAW (*)    Specific Gravity, Urine <1.005 (*)    Glucose, UA 100 (*)    Leukocytes, UA SMALL (*)    All other components within normal limits  URINE MICROSCOPIC-ADD ON - Abnormal; Notable for the following:    Squamous Epithelial / LPF 6-30 (*)    Bacteria, UA MANY (*)    All other components within normal limits  CBG MONITORING, ED - Abnormal; Notable for the following:    Glucose-Capillary 433 (*)    All other components within normal limits  CBG MONITORING, ED - Abnormal; Notable for the following:     Glucose-Capillary 206 (*)    All other components within normal limits  CBG MONITORING, ED - Abnormal; Notable for the following:    Glucose-Capillary 111 (*)    All other components within normal limits  CBG MONITORING, ED - Abnormal; Notable for the following:    Glucose-Capillary 120 (*)    All other components within normal limits  URINE CULTURE    EKG  EKG Interpretation  Date/Time:  Wednesday May 21 2016 23:41:54 EDT Ventricular Rate:  72 PR Interval:  QRS Duration: 93 QT Interval:  433 QTC Calculation: 474 R Axis:   -8 Text Interpretation:  Sinus rhythm Low voltage, extremity and precordial leads No significant change was found Confirmed by Wyvonnia Dusky  MD, Nikoletta Varma [phone]) on 05/21/2016 11:46:15 PM       Radiology Dg Chest 2 View  Result Date: 05/22/2016 CLINICAL DATA:  82 year old female with weakness EXAM: CHEST  2 VIEW COMPARISON:  Chest radiograph dated 06/13/2013 FINDINGS: Two views of the chest do not demonstrate a focal consolidation. There is no pleural effusion or pneumothorax. The cardiac silhouette is within normal limits. There is osteopenia with degenerative changes of the spine. No acute fracture. IMPRESSION: No active cardiopulmonary disease. Electronically Signed   By: Anner Crete M.D.   On: 05/22/2016 00:11    Procedures Procedures (including critical care time)  Medications Ordered in ED Medications  sodium chloride 0.9 % bolus 500 mL (0 mLs Intravenous Stopped 05/22/16 0046)  insulin aspart (novoLOG) injection 8 Units (8 Units Subcutaneous Given 05/21/16 2342)  sodium chloride 0.9 % bolus 500 mL (0 mLs Intravenous Stopped 05/22/16 0226)     Initial Impression / Assessment and Plan / ED Course  I have reviewed the triage vital signs and the nursing notes.  Pertinent labs & imaging results that were available during my care of the patient were reviewed by me and considered in my medical decision making (see chart for details).  Clinical Course    Comment By Time  Labs show hyperglycemia without DKA. Patient will be given IV fluids and IV insulin. Ezequiel Essex, MD 08/02 2338  Orthostatics negative Ezequiel Essex, MD 08/03 [phone]   Patient from home with hyperglycemia. Denies dizziness contrary to triage note. No chest pain or SOB.  Hyperglycemia without DKA. IVF and insulin given.  Blood sugar improved to 120. Labs also with AKI. She is tolerating PO and ambulatory. Denies complaint.  Advised to keep log of blood sugars and followup closely with PCP. Treat possible UTI.  Return precautions discussed.  I personally performed the services described in this documentation, which was scribed in my presence. The recorded information has been reviewed and is accurate.   Final Clinical Impressions(s) / ED Diagnoses   Final diagnoses:  Hyperglycemia  Urinary tract infection without hematuria, site unspecified    New Prescriptions Discharge Medication List as of 05/22/2016  3:18 AM    START taking these medications   Details  cephALEXin (KEFLEX) 500 MG capsule Take 1 capsule (500 mg total) by mouth 2 (two) times daily., Starting Thu 05/22/2016, Print         Ezequiel Essex, MD 05/22/16 [phone]

## 2016-05-21 NOTE — ED Triage Notes (Signed)
Pt c/o dizziness and states her blood sugar was in the 500s.

## 2016-05-22 DIAGNOSIS — R531 Weakness: Secondary | ICD-10-CM | POA: Diagnosis not present

## 2016-05-22 LAB — CBG MONITORING, ED
GLUCOSE-CAPILLARY: 111 mg/dL — AB (ref 65–99)
Glucose-Capillary: 206 mg/dL — ABNORMAL HIGH (ref 65–99)
Glucose-Capillary: 120 mg/dL — ABNORMAL HIGH (ref 65–99)

## 2016-05-22 LAB — URINALYSIS, ROUTINE W REFLEX MICROSCOPIC
Bilirubin Urine: NEGATIVE
Glucose, UA: 100 mg/dL — AB
HGB URINE DIPSTICK: NEGATIVE
Ketones, ur: NEGATIVE mg/dL
Nitrite: NEGATIVE
PROTEIN: NEGATIVE mg/dL
pH: 5.5 (ref 5.0–8.0)

## 2016-05-22 LAB — URINE MICROSCOPIC-ADD ON

## 2016-05-22 MED ORDER — SODIUM CHLORIDE 0.9 % IV BOLUS (SEPSIS)
500.0000 mL | Freq: Once | INTRAVENOUS | Status: AC
  Administered 2016-05-22: 500 mL via INTRAVENOUS

## 2016-05-22 MED ORDER — CEPHALEXIN 500 MG PO CAPS: 500.0000 mg | ORAL_CAPSULE | Freq: Two times a day (BID) | ORAL | 0 refills | Status: DC

## 2016-05-22 NOTE — ED Notes (Signed)
cbg of 111.

## 2016-05-22 NOTE — ED Notes (Signed)
Patient walked to the bathroom with minimal assistance. Patient denies short of breath and dizziness. She stated that her feet hurts.

## 2016-05-22 NOTE — Discharge Instructions (Signed)
Monitor your blood sugars and followup with your doctor. Return to the ED if you develop new or worsening symptoms.

## 2016-05-23 LAB — URINE CULTURE

## 2016-06-09 DIAGNOSIS — E119 Type 2 diabetes mellitus without complications: Secondary | ICD-10-CM | POA: Diagnosis not present

## 2016-06-09 DIAGNOSIS — Z794 Long term (current) use of insulin: Secondary | ICD-10-CM | POA: Diagnosis not present

## 2016-07-11 DIAGNOSIS — B351 Tinea unguium: Secondary | ICD-10-CM | POA: Diagnosis not present

## 2016-07-11 DIAGNOSIS — L851 Acquired keratosis [keratoderma] palmaris et plantaris: Secondary | ICD-10-CM | POA: Diagnosis not present

## 2016-07-11 DIAGNOSIS — E1142 Type 2 diabetes mellitus with diabetic polyneuropathy: Secondary | ICD-10-CM | POA: Diagnosis not present

## 2016-07-29 DIAGNOSIS — Z794 Long term (current) use of insulin: Secondary | ICD-10-CM | POA: Diagnosis not present

## 2016-07-29 DIAGNOSIS — E119 Type 2 diabetes mellitus without complications: Secondary | ICD-10-CM | POA: Diagnosis not present

## 2016-08-29 DIAGNOSIS — E119 Type 2 diabetes mellitus without complications: Secondary | ICD-10-CM | POA: Diagnosis not present

## 2016-08-29 DIAGNOSIS — I10 Essential (primary) hypertension: Secondary | ICD-10-CM | POA: Diagnosis not present

## 2016-08-29 DIAGNOSIS — E785 Hyperlipidemia, unspecified: Secondary | ICD-10-CM | POA: Diagnosis not present

## 2016-08-29 DIAGNOSIS — Z6829 Body mass index (BMI) 29.0-29.9, adult: Secondary | ICD-10-CM | POA: Diagnosis not present

## 2016-09-09 DIAGNOSIS — E119 Type 2 diabetes mellitus without complications: Secondary | ICD-10-CM | POA: Diagnosis not present

## 2016-09-09 DIAGNOSIS — E785 Hyperlipidemia, unspecified: Secondary | ICD-10-CM | POA: Diagnosis not present

## 2016-09-09 DIAGNOSIS — I10 Essential (primary) hypertension: Secondary | ICD-10-CM | POA: Diagnosis not present

## 2016-09-11 ENCOUNTER — Emergency Department (HOSPITAL_COMMUNITY)
Admission: EM | Admit: 2016-09-11 | Discharge: 2016-09-11 | Disposition: A | Discharge status: Home / Self Care | Payer: Medicare Other | Attending: Emergency Medicine | Admitting: Emergency Medicine

## 2016-09-11 ENCOUNTER — Emergency Department (HOSPITAL_COMMUNITY): Payer: Medicare Other

## 2016-09-11 ENCOUNTER — Encounter (HOSPITAL_COMMUNITY): Payer: Self-pay | Admitting: *Deleted

## 2016-09-11 DIAGNOSIS — M25562 Pain in left knee: Secondary | ICD-10-CM | POA: Diagnosis not present

## 2016-09-11 DIAGNOSIS — S99912A Unspecified injury of left ankle, initial encounter: Secondary | ICD-10-CM | POA: Diagnosis present

## 2016-09-11 DIAGNOSIS — M1991 Primary osteoarthritis, unspecified site: Secondary | ICD-10-CM | POA: Diagnosis not present

## 2016-09-11 DIAGNOSIS — Y999 Unspecified external cause status: Secondary | ICD-10-CM | POA: Diagnosis not present

## 2016-09-11 DIAGNOSIS — M150 Primary generalized (osteo)arthritis: Secondary | ICD-10-CM

## 2016-09-11 DIAGNOSIS — M25462 Effusion, left knee: Secondary | ICD-10-CM | POA: Diagnosis not present

## 2016-09-11 DIAGNOSIS — Y939 Activity, unspecified: Secondary | ICD-10-CM | POA: Diagnosis not present

## 2016-09-11 DIAGNOSIS — M79662 Pain in left lower leg: Secondary | ICD-10-CM | POA: Diagnosis not present

## 2016-09-11 DIAGNOSIS — Y92009 Unspecified place in unspecified non-institutional (private) residence as the place of occurrence of the external cause: Secondary | ICD-10-CM | POA: Insufficient documentation

## 2016-09-11 DIAGNOSIS — M79672 Pain in left foot: Secondary | ICD-10-CM | POA: Diagnosis not present

## 2016-09-11 DIAGNOSIS — I10 Essential (primary) hypertension: Secondary | ICD-10-CM | POA: Diagnosis not present

## 2016-09-11 DIAGNOSIS — S8992XA Unspecified injury of left lower leg, initial encounter: Secondary | ICD-10-CM | POA: Diagnosis not present

## 2016-09-11 DIAGNOSIS — W108XXA Fall (on) (from) other stairs and steps, initial encounter: Secondary | ICD-10-CM | POA: Insufficient documentation

## 2016-09-11 DIAGNOSIS — S93402A Sprain of unspecified ligament of left ankle, initial encounter: Secondary | ICD-10-CM

## 2016-09-11 DIAGNOSIS — E119 Type 2 diabetes mellitus without complications: Secondary | ICD-10-CM | POA: Insufficient documentation

## 2016-09-11 DIAGNOSIS — Z79899 Other long term (current) drug therapy: Secondary | ICD-10-CM | POA: Insufficient documentation

## 2016-09-11 DIAGNOSIS — M1990 Unspecified osteoarthritis, unspecified site: Secondary | ICD-10-CM | POA: Diagnosis not present

## 2016-09-11 DIAGNOSIS — M159 Polyosteoarthritis, unspecified: Secondary | ICD-10-CM

## 2016-09-11 DIAGNOSIS — S99922A Unspecified injury of left foot, initial encounter: Secondary | ICD-10-CM | POA: Diagnosis not present

## 2016-09-11 MED ORDER — HYDROCODONE-ACETAMINOPHEN 5-325 MG PO TABS: ORAL_TABLET | ORAL | 0 refills | Status: DC

## 2016-09-11 MED ORDER — ACETAMINOPHEN 500 MG PO TABS
1000.0000 mg | ORAL_TABLET | Freq: Once | ORAL | Status: AC
  Administered 2016-09-11: 1000 mg via ORAL
  Filled 2016-09-11: qty 2

## 2016-09-11 NOTE — ED Provider Notes (Signed)
[facility] DEPT Provider Note   CSN: [id number] Arrival date & time: 09/11/16  1450     History   Chief Complaint Chief Complaint  Patient presents with  . Fall    HPI Rebecca Hardy is a 82 y.o. female.  Patient is an 82 year old female who presents to the emergency department with a complaint of left knee and leg pain.  The patient states that she missed a step on last evening. She is not completely sure how she landed, but has pain of the left knee and left leg. She denies hitting her head. She denies injuring her chest or rib area. Patient denies being on any anticoagulation medication she also denies history of any bleeding disorders. No previous operations or procedures involving the left lower extremity. The patient has not taken any medication for this problem to this point.   The history is provided by the patient.  Fall  Pertinent negatives include no chest pain, no abdominal pain and no shortness of breath.    Past Medical History:  Diagnosis Date  . 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 Diabetes mellitus   . GERD (gastroesophageal reflux disease)   . High cholesterol   . History of TIA (transient ischemic attack)   . Hypertension     There are no active problems to display for this patient.   Past Surgical History:  Procedure Laterality Date  . ABDOMINAL HYSTERECTOMY    . BREAST LUMPECTOMY    . CESAREAN SECTION      OB History    No data available       Home Medications    Prior to Admission medications   Medication Sig Start Date End Date Taking? Authorizing Provider  acetaminophen (TYLENOL) 650 MG CR tablet Take 650 mg by mouth every 8 (eight) hours as needed for pain.     Historical Provider, MD  cephALEXin (KEFLEX) 500 MG capsule Take 1 capsule (500 mg total) by mouth 2 (two) times daily. 05/22/16   Ezequiel Essex, MD  ezetimibe-simvastatin (VYTORIN) 10-40 MG per tablet Take 1 tablet by mouth at bedtime.       Historical Provider, MD  furosemide (LASIX) 20 MG tablet Take 20 mg by mouth daily.    Historical Provider, MD  lisinopril (PRINIVIL,ZESTRIL) 10 MG tablet Take 10 mg by mouth daily.      Historical Provider, MD  pioglitazone (ACTOS) 15 MG tablet Take 15 mg by mouth daily.  05/19/16   Historical Provider, MD  sertraline (ZOLOFT) 25 MG tablet Take 25 mg by mouth daily. 03/27/15   Historical Provider, MD  TOUJEO SOLOSTAR 300 UNIT/ML SOPN Inject 8-20 Units into the skin 2 (two) times daily. 20 units in the morning and 8 units at bedtime 05/21/16   Historical Provider, MD    Family History Family History  Problem Relation Age of Onset  . Cancer Mother   . Hypertension Father     Social History Social History  Substance Use Topics  . Smoking status: Never Smoker  . Smokeless tobacco: Never Used  . Alcohol use No     Allergies   Codeine and Penicillins   Review of Systems Review of Systems  Constitutional: Negative for activity change.       All ROS Neg except as noted in HPI  HENT: Negative for nosebleeds.   Eyes: Negative for photophobia and discharge.  Respiratory: Negative for cough, shortness of breath and wheezing.   Cardiovascular: Negative  for chest pain and palpitations.  Gastrointestinal: Negative for abdominal pain and blood in stool.  Genitourinary: Negative for dysuria, frequency and hematuria.  Musculoskeletal: Positive for arthralgias. Negative for back pain and neck pain.  Skin: Negative.   Neurological: Negative for dizziness, seizures and speech difficulty.  Psychiatric/Behavioral: Negative for confusion and hallucinations.     Physical Exam Updated Vital Signs BP (!) 134/52 (BP Location: Right Arm)   Pulse 89   Temp 97.6 F (36.4 C) (Oral)   Resp 18   Ht 5' 5.5" (1.664 m)   Wt 82.6 kg   SpO2 100%   BMI 29.83 kg/m   Physical Exam  Constitutional: She is oriented to person, place, and time. She appears well-developed and well-nourished.  Non-toxic  appearance.  HENT:  Head: Normocephalic.  Right Ear: Tympanic membrane and external ear normal.  Left Ear: Tympanic membrane and external ear normal.  Eyes: EOM and lids are normal. Pupils are equal, round, and reactive to light.  Neck: Normal range of motion. Neck supple. Carotid bruit is not present.  Cardiovascular: Normal rate, regular rhythm, intact distal pulses and normal pulses.  Exam reveals no gallop and no friction rub.   Murmur heard. Pulmonary/Chest: Breath sounds normal. No respiratory distress.  Abdominal: Soft. Bowel sounds are normal. There is no tenderness. There is no guarding.  Musculoskeletal: Normal range of motion. She exhibits tenderness.  There is good range of motion of the right and left hip. There is no pain with movement of the pelvis.  There is an abrasion to the anterior left knee. There is soreness of the lateral knee. There is no palpable effusion appreciated. Is no posterior mass noted. The patella is in the midline.  There is no deformity of the tibial area. There is tenderness to the lateral malleolus. There is soft tissue swelling present. There is some soreness of the medial malleolus. The dorsalis pedis pulses are 1+. And the capillary refill is 2 seconds.  Lymphadenopathy:       Head (right side): No submandibular adenopathy present.       Head (left side): No submandibular adenopathy present.    She has no cervical adenopathy.  Neurological: She is alert and oriented to person, place, and time. She has normal strength. No cranial nerve deficit or sensory deficit.  Skin: Skin is warm and dry.  Psychiatric: She has a normal mood and affect. Her speech is normal.  Nursing note and vitals reviewed.    ED Treatments / Results  Labs (all labs ordered are listed, but only abnormal results are displayed) Labs Reviewed - No data to display  EKG  EKG Interpretation None       Radiology No results found.  Procedures Procedures (including  critical care time)  Medications Ordered in ED Medications - No data to display   Initial Impression / Assessment and Plan / ED Course  I have reviewed the triage vital signs and the nursing notes.  Pertinent labs & imaging results that were available during my care of the patient were reviewed by me and considered in my medical decision making (see chart for details).  Clinical Course     **I have reviewed nursing notes, vital signs, and all appropriate lab and imaging results for this patient.  Final Clinical Impressions(s) / ED Diagnoses Vital signs reviewed. X-ray of the left tib-fib area is negative for fracture or dislocation. X-ray of the left knee reveal advanced degenerative changes present. X-ray of the left foot shows an  irregularity of the superior talar neck. There is a question as to whether this is chronic, or a acute versus remote injury.    Final diagnoses:  None    New Prescriptions New Prescriptions   No medications on file     Lily Kocher, PA-C 09/11/16 1627    Noemi Chapel, MD 09/12/16 1220

## 2016-09-11 NOTE — Discharge Instructions (Signed)
The x-ray and CT scan of your ankle suggest an old injury to your ankle, and current ankle sprain. No fracture or dislocation. X-ray of your lower leg and knee show arthritis changes, but no fracture and no dislocation. Please continue to use the walker you have at home. Keep your leg elevated above your waist is much as possible. Use Tylenol every 4 hours. Use Norco at bedtime if needed for more severe pain. Use this medication with caution, as it may make you lightheaded, and/or drowsy. Please see Dr. Nevada Crane for additional evaluation and management if not improving.

## 2016-09-11 NOTE — ED Triage Notes (Signed)
Pt miss step on ramp at home last night and fell.  Pt not sure where she landed but c/o pain to left knee and left leg.

## 2016-09-15 DIAGNOSIS — E1165 Type 2 diabetes mellitus with hyperglycemia: Secondary | ICD-10-CM | POA: Diagnosis not present

## 2016-09-15 DIAGNOSIS — N183 Chronic kidney disease, stage 3 (moderate): Secondary | ICD-10-CM | POA: Diagnosis not present

## 2016-09-15 DIAGNOSIS — Z23 Encounter for immunization: Secondary | ICD-10-CM | POA: Diagnosis not present

## 2016-09-15 DIAGNOSIS — Z0000 Encounter for general adult medical examination without abnormal findings: Secondary | ICD-10-CM | POA: Diagnosis not present

## 2016-09-15 DIAGNOSIS — E782 Mixed hyperlipidemia: Secondary | ICD-10-CM | POA: Diagnosis not present

## 2016-09-26 DIAGNOSIS — E1142 Type 2 diabetes mellitus with diabetic polyneuropathy: Secondary | ICD-10-CM | POA: Diagnosis not present

## 2016-09-26 DIAGNOSIS — B351 Tinea unguium: Secondary | ICD-10-CM | POA: Diagnosis not present

## 2016-09-26 DIAGNOSIS — L851 Acquired keratosis [keratoderma] palmaris et plantaris: Secondary | ICD-10-CM | POA: Diagnosis not present

## 2016-10-01 DIAGNOSIS — E1165 Type 2 diabetes mellitus with hyperglycemia: Secondary | ICD-10-CM | POA: Diagnosis not present

## 2016-10-01 DIAGNOSIS — Z6830 Body mass index (BMI) 30.0-30.9, adult: Secondary | ICD-10-CM | POA: Diagnosis not present

## 2016-10-01 DIAGNOSIS — N183 Chronic kidney disease, stage 3 (moderate): Secondary | ICD-10-CM | POA: Diagnosis not present

## 2016-10-01 DIAGNOSIS — E785 Hyperlipidemia, unspecified: Secondary | ICD-10-CM | POA: Diagnosis not present

## 2016-10-01 DIAGNOSIS — I10 Essential (primary) hypertension: Secondary | ICD-10-CM | POA: Diagnosis not present

## 2016-10-10 DIAGNOSIS — E119 Type 2 diabetes mellitus without complications: Secondary | ICD-10-CM | POA: Diagnosis not present

## 2016-10-10 DIAGNOSIS — Z794 Long term (current) use of insulin: Secondary | ICD-10-CM | POA: Diagnosis not present

## 2016-10-29 DIAGNOSIS — E1165 Type 2 diabetes mellitus with hyperglycemia: Secondary | ICD-10-CM | POA: Diagnosis not present

## 2016-10-29 DIAGNOSIS — I10 Essential (primary) hypertension: Secondary | ICD-10-CM | POA: Diagnosis not present

## 2016-10-29 DIAGNOSIS — R600 Localized edema: Secondary | ICD-10-CM | POA: Diagnosis not present

## 2016-10-29 DIAGNOSIS — N183 Chronic kidney disease, stage 3 (moderate): Secondary | ICD-10-CM | POA: Diagnosis not present

## 2016-10-29 DIAGNOSIS — E785 Hyperlipidemia, unspecified: Secondary | ICD-10-CM | POA: Diagnosis not present

## 2016-11-28 DIAGNOSIS — E1165 Type 2 diabetes mellitus with hyperglycemia: Secondary | ICD-10-CM | POA: Diagnosis not present

## 2016-11-28 DIAGNOSIS — E785 Hyperlipidemia, unspecified: Secondary | ICD-10-CM | POA: Diagnosis not present

## 2016-12-03 DIAGNOSIS — N183 Chronic kidney disease, stage 3 (moderate): Secondary | ICD-10-CM | POA: Diagnosis not present

## 2016-12-03 DIAGNOSIS — E1165 Type 2 diabetes mellitus with hyperglycemia: Secondary | ICD-10-CM | POA: Diagnosis not present

## 2016-12-03 DIAGNOSIS — I10 Essential (primary) hypertension: Secondary | ICD-10-CM | POA: Diagnosis not present

## 2016-12-03 DIAGNOSIS — E785 Hyperlipidemia, unspecified: Secondary | ICD-10-CM | POA: Diagnosis not present

## 2017-01-01 DIAGNOSIS — E119 Type 2 diabetes mellitus without complications: Secondary | ICD-10-CM | POA: Diagnosis not present

## 2017-01-01 DIAGNOSIS — Z794 Long term (current) use of insulin: Secondary | ICD-10-CM | POA: Diagnosis not present

## 2017-03-09 DIAGNOSIS — E1165 Type 2 diabetes mellitus with hyperglycemia: Secondary | ICD-10-CM | POA: Diagnosis not present

## 2017-03-09 DIAGNOSIS — E782 Mixed hyperlipidemia: Secondary | ICD-10-CM | POA: Diagnosis not present

## 2017-03-11 DIAGNOSIS — N183 Chronic kidney disease, stage 3 (moderate): Secondary | ICD-10-CM | POA: Diagnosis not present

## 2017-03-11 DIAGNOSIS — I10 Essential (primary) hypertension: Secondary | ICD-10-CM | POA: Diagnosis not present

## 2017-03-11 DIAGNOSIS — E1165 Type 2 diabetes mellitus with hyperglycemia: Secondary | ICD-10-CM | POA: Diagnosis not present

## 2017-03-11 DIAGNOSIS — E782 Mixed hyperlipidemia: Secondary | ICD-10-CM | POA: Diagnosis not present

## 2017-03-12 DIAGNOSIS — Z794 Long term (current) use of insulin: Secondary | ICD-10-CM | POA: Diagnosis not present

## 2017-03-12 DIAGNOSIS — E119 Type 2 diabetes mellitus without complications: Secondary | ICD-10-CM | POA: Diagnosis not present

## 2017-07-15 ENCOUNTER — Encounter (HOSPITAL_COMMUNITY): Payer: Self-pay | Admitting: Emergency Medicine

## 2017-07-15 ENCOUNTER — Emergency Department (HOSPITAL_COMMUNITY)
Admission: EM | Admit: 2017-07-15 | Discharge: 2017-07-15 | Disposition: A | Discharge status: Home / Self Care | Payer: Medicare Other | Attending: Emergency Medicine | Admitting: Emergency Medicine

## 2017-07-15 DIAGNOSIS — Z79899 Other long term (current) drug therapy: Secondary | ICD-10-CM | POA: Diagnosis not present

## 2017-07-15 DIAGNOSIS — I10 Essential (primary) hypertension: Secondary | ICD-10-CM | POA: Diagnosis not present

## 2017-07-15 DIAGNOSIS — Z794 Long term (current) use of insulin: Secondary | ICD-10-CM | POA: Insufficient documentation

## 2017-07-15 DIAGNOSIS — R21 Rash and other nonspecific skin eruption: Secondary | ICD-10-CM | POA: Diagnosis present

## 2017-07-15 DIAGNOSIS — R58 Hemorrhage, not elsewhere classified: Secondary | ICD-10-CM | POA: Diagnosis not present

## 2017-07-15 DIAGNOSIS — E119 Type 2 diabetes mellitus without complications: Secondary | ICD-10-CM | POA: Diagnosis not present

## 2017-07-15 DIAGNOSIS — W19XXXD Unspecified fall, subsequent encounter: Secondary | ICD-10-CM | POA: Diagnosis not present

## 2017-07-15 DIAGNOSIS — B029 Zoster without complications: Secondary | ICD-10-CM

## 2017-07-15 DIAGNOSIS — S5012XA Contusion of left forearm, initial encounter: Secondary | ICD-10-CM | POA: Diagnosis not present

## 2017-07-15 HISTORY — DX: Cerebral infarction, unspecified: I63.9

## 2017-07-15 MED ORDER — VALACYCLOVIR HCL 1 G PO TABS: 1000.0000 mg | ORAL_TABLET | Freq: Three times a day (TID) | ORAL | 0 refills | Status: AC

## 2017-07-15 MED ORDER — VALACYCLOVIR HCL 500 MG PO TABS
1000.0000 mg | ORAL_TABLET | Freq: Once | ORAL | Status: AC
  Administered 2017-07-15: 1000 mg via ORAL

## 2017-07-15 MED ORDER — VALACYCLOVIR HCL 500 MG PO TABS
ORAL_TABLET | ORAL | Status: AC
  Filled 2017-07-15: qty 2

## 2017-07-15 NOTE — ED Provider Notes (Signed)
[facility] DEPT Provider Note   CSN: [id number] Arrival date & time: 07/15/17  1755     History   Chief Complaint Chief Complaint  Patient presents with  . Fall    HPI Rebecca Hardy is a 83 y.o. female.  Patient fell approximately 1 week ago and now has bruises on her right humerus and left forearm. Additionally, she noticed a rash on her left upper back that is nonpainful. No fever, sweats, chills. She is in no acute distress. Severity of symptoms is mild.  She is moving all her extremities.      Past Medical History:  Diagnosis Date  . 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 Diabetes mellitus   . GERD (gastroesophageal reflux disease)   . High cholesterol   . History of TIA (transient ischemic attack)   . Hypertension   . Stroke [facility])     There are no active problems to display for this patient.   Past Surgical History:  Procedure Laterality Date  . ABDOMINAL HYSTERECTOMY    . BREAST LUMPECTOMY    . CESAREAN SECTION      OB History    No data available       Home Medications    Prior to Admission medications   Medication Sig Start Date End Date Taking? Authorizing Provider  acetaminophen (TYLENOL) 650 MG CR tablet Take 650 mg by mouth every 8 (eight) hours as needed for pain.     [provider]  cephALEXin (KEFLEX) 500 MG capsule Take 1 capsule (500 mg total) by mouth 2 (two) times daily. Patient not taking: Reported on 09/11/2016 05/22/16   Ezequiel Essex, MD  ezetimibe-simvastatin (VYTORIN) 10-40 MG per tablet Take 1 tablet by mouth daily.     [provider]  furosemide (LASIX) 20 MG tablet Take 20 mg by mouth daily.    [provider]  HYDROcodone-acetaminophen (NORCO/VICODIN) 5-325 MG tablet 1 at hs or q6h prn pain. Take this medication with food. 09/11/16   Lily Kocher, PA-C  lisinopril (PRINIVIL,ZESTRIL) 10 MG tablet Take 10 mg by mouth daily.      [provider]  pioglitazone  (ACTOS) 15 MG tablet Take 15 mg by mouth at bedtime.  05/19/16   [provider]  sertraline (ZOLOFT) 25 MG tablet Take 25 mg by mouth daily. 03/27/15   [provider]  TOUJEO SOLOSTAR 300 UNIT/ML SOPN Inject 8-20 Units into the skin 2 (two) times daily. 20 units in the morning and 8 units at bedtime 05/21/16   [provider]  valACYclovir (VALTREX) 1000 MG tablet Take 1 tablet (1,000 mg total) by mouth 3 (three) times daily. 07/15/17 07/29/17  Nat Christen, MD    Family History Family History  Problem Relation Age of Onset  . Cancer Mother   . Hypertension Father     Social History Social History  Substance Use Topics  . Smoking status: Never Smoker  . Smokeless tobacco: Never Used  . Alcohol use No     Allergies   Codeine and Penicillins   Review of Systems Review of Systems  All other systems reviewed and are negative.    Physical Exam Updated Vital Signs BP (!) 152/65 (BP Location: Left Arm)   Pulse 84   Temp (!) 97.4 F (36.3 C) (Temporal)   Resp 16   Ht 5' 5.5" (1.664 m)   Wt 74.8 kg (165 lb)   SpO2 98%   BMI 27.04  kg/m   Physical Exam  Constitutional: She is oriented to person, place, and time. She appears well-developed and well-nourished.  HENT:  Head: Normocephalic and atraumatic.  Eyes: Conjunctivae are normal.  Neck: Neck supple.  Cardiovascular: Normal rate and regular rhythm.   Pulmonary/Chest: Effort normal and breath sounds normal.  Abdominal: Soft. Bowel sounds are normal.  Musculoskeletal: Normal range of motion.  Neurological: She is alert and oriented to person, place, and time.  Skin:  Area of ecchymosis on right proximal lateral humerus and anterior lateral left forearm.   Left upper back:  Papular excoriation in the distribution of left T3 T4 dermatome  Psychiatric: She has a normal mood and affect. Her behavior is normal.  Nursing note and vitals reviewed.    ED Treatments / Results  Labs (all labs ordered  are listed, but only abnormal results are displayed) Labs Reviewed - No data to display  EKG  EKG Interpretation None       Radiology No results found.  Procedures Procedures (including critical care time)  Medications Ordered in ED Medications  valACYclovir (VALTREX) 500 MG tablet (not administered)  valACYclovir (VALTREX) tablet 1,000 mg (1,000 mg Oral Given 07/15/17 1853)     Initial Impression / Assessment and Plan / ED Course  I have reviewed the triage vital signs and the nursing notes.  Pertinent labs & imaging results that were available during my care of the patient were reviewed by me and considered in my medical decision making (see chart for details).     Patient was most concerned about the bruising in her arms. She has obvious ecchymosis. However, she has full range of motion and imaging was not necessary. However, she appears to have shingles on her left upper back. Will Rx valacyclovir for same. This was discussed with the patient and her daughter.  Final Clinical Impressions(s) / ED Diagnoses   Final diagnoses:  Herpes zoster without complication  Ecchymosis    New Prescriptions Discharge Medication List as of 07/15/2017  6:41 PM    START taking these medications   Details  valACYclovir (VALTREX) 1000 MG tablet Take 1 tablet (1,000 mg total) by mouth 3 (three) times daily., Starting Wed 07/15/2017, Until Wed 07/29/2017, Print         Nat Christen, MD 07/15/17 2120

## 2017-07-15 NOTE — Discharge Instructions (Signed)
The discolored spots on your arms are called ecchymosis. Additionally, your back shows evidence of shingles. Will Rx an antiviral medication for same.

## 2017-07-15 NOTE — ED Triage Notes (Signed)
Pt ripped an fell x 1 week ago. Pt has bruising noted to bue. Pt a/o. C/o mid back pain that will not go away. Pt has shingle like rash noted to same area of where pt is complaining of pain. Or could possibly be abrasion from fall.

## 2017-10-06 DIAGNOSIS — Z6827 Body mass index (BMI) 27.0-27.9, adult: Secondary | ICD-10-CM | POA: Diagnosis not present

## 2017-10-06 DIAGNOSIS — N183 Chronic kidney disease, stage 3 (moderate): Secondary | ICD-10-CM | POA: Diagnosis not present

## 2017-10-06 DIAGNOSIS — R600 Localized edema: Secondary | ICD-10-CM | POA: Diagnosis not present

## 2017-10-06 DIAGNOSIS — Z9119 Patient's noncompliance with other medical treatment and regimen: Secondary | ICD-10-CM | POA: Diagnosis not present

## 2017-10-06 DIAGNOSIS — E1165 Type 2 diabetes mellitus with hyperglycemia: Secondary | ICD-10-CM | POA: Diagnosis not present

## 2017-10-06 DIAGNOSIS — I10 Essential (primary) hypertension: Secondary | ICD-10-CM | POA: Diagnosis not present

## 2017-10-16 DIAGNOSIS — E782 Mixed hyperlipidemia: Secondary | ICD-10-CM | POA: Diagnosis not present

## 2017-10-16 DIAGNOSIS — I10 Essential (primary) hypertension: Secondary | ICD-10-CM | POA: Diagnosis not present

## 2017-10-16 DIAGNOSIS — E1165 Type 2 diabetes mellitus with hyperglycemia: Secondary | ICD-10-CM | POA: Diagnosis not present

## 2017-10-16 DIAGNOSIS — N183 Chronic kidney disease, stage 3 (moderate): Secondary | ICD-10-CM | POA: Diagnosis not present

## 2017-11-11 DIAGNOSIS — E1165 Type 2 diabetes mellitus with hyperglycemia: Secondary | ICD-10-CM | POA: Diagnosis not present

## 2017-11-11 DIAGNOSIS — I10 Essential (primary) hypertension: Secondary | ICD-10-CM | POA: Diagnosis not present

## 2017-11-11 DIAGNOSIS — E782 Mixed hyperlipidemia: Secondary | ICD-10-CM | POA: Diagnosis not present

## 2017-11-11 DIAGNOSIS — N183 Chronic kidney disease, stage 3 (moderate): Secondary | ICD-10-CM | POA: Diagnosis not present

## 2018-03-30 DIAGNOSIS — L989 Disorder of the skin and subcutaneous tissue, unspecified: Secondary | ICD-10-CM | POA: Diagnosis not present

## 2018-03-30 DIAGNOSIS — I872 Venous insufficiency (chronic) (peripheral): Secondary | ICD-10-CM | POA: Diagnosis not present

## 2018-03-30 DIAGNOSIS — R600 Localized edema: Secondary | ICD-10-CM | POA: Diagnosis not present

## 2018-03-30 DIAGNOSIS — Z6828 Body mass index (BMI) 28.0-28.9, adult: Secondary | ICD-10-CM | POA: Diagnosis not present

## 2018-04-09 DIAGNOSIS — E1142 Type 2 diabetes mellitus with diabetic polyneuropathy: Secondary | ICD-10-CM | POA: Diagnosis not present

## 2018-04-09 DIAGNOSIS — B351 Tinea unguium: Secondary | ICD-10-CM | POA: Diagnosis not present

## 2018-04-15 DIAGNOSIS — I872 Venous insufficiency (chronic) (peripheral): Secondary | ICD-10-CM | POA: Diagnosis not present

## 2018-04-15 DIAGNOSIS — L820 Inflamed seborrheic keratosis: Secondary | ICD-10-CM | POA: Diagnosis not present

## 2018-05-13 DIAGNOSIS — E876 Hypokalemia: Secondary | ICD-10-CM | POA: Diagnosis not present

## 2018-05-13 DIAGNOSIS — E1165 Type 2 diabetes mellitus with hyperglycemia: Secondary | ICD-10-CM | POA: Diagnosis not present

## 2018-05-13 DIAGNOSIS — N183 Chronic kidney disease, stage 3 (moderate): Secondary | ICD-10-CM | POA: Diagnosis not present

## 2018-05-13 DIAGNOSIS — I10 Essential (primary) hypertension: Secondary | ICD-10-CM | POA: Diagnosis not present

## 2018-05-17 DIAGNOSIS — E782 Mixed hyperlipidemia: Secondary | ICD-10-CM | POA: Diagnosis not present

## 2018-05-17 DIAGNOSIS — R739 Hyperglycemia, unspecified: Secondary | ICD-10-CM | POA: Diagnosis not present

## 2018-05-17 DIAGNOSIS — E1165 Type 2 diabetes mellitus with hyperglycemia: Secondary | ICD-10-CM | POA: Diagnosis not present

## 2018-05-17 DIAGNOSIS — I10 Essential (primary) hypertension: Secondary | ICD-10-CM | POA: Diagnosis not present

## 2018-05-25 DIAGNOSIS — H5212 Myopia, left eye: Secondary | ICD-10-CM | POA: Diagnosis not present

## 2018-06-14 DIAGNOSIS — D0439 Carcinoma in situ of skin of other parts of face: Secondary | ICD-10-CM | POA: Diagnosis not present

## 2018-06-14 DIAGNOSIS — I872 Venous insufficiency (chronic) (peripheral): Secondary | ICD-10-CM | POA: Diagnosis not present

## 2018-08-19 ENCOUNTER — Encounter (HOSPITAL_COMMUNITY): Payer: Self-pay

## 2018-08-19 ENCOUNTER — Emergency Department (HOSPITAL_COMMUNITY): Payer: Medicare Other

## 2018-08-19 ENCOUNTER — Other Ambulatory Visit: Payer: Self-pay

## 2018-08-19 ENCOUNTER — Inpatient Hospital Stay (HOSPITAL_COMMUNITY): Payer: Medicare Other

## 2018-08-19 ENCOUNTER — Inpatient Hospital Stay (HOSPITAL_COMMUNITY)
Admission: EM | Admit: 2018-08-19 | Discharge: 2018-08-24 | DRG: 041 | Disposition: A | Discharge status: Rehabilitation Facility | Payer: Medicare Other | Attending: Family Medicine | Admitting: Family Medicine

## 2018-08-19 DIAGNOSIS — K219 Gastro-esophageal reflux disease without esophagitis: Secondary | ICD-10-CM | POA: Diagnosis present

## 2018-08-19 DIAGNOSIS — E7800 Pure hypercholesterolemia, unspecified: Secondary | ICD-10-CM | POA: Diagnosis not present

## 2018-08-19 DIAGNOSIS — I6389 Other cerebral infarction: Secondary | ICD-10-CM | POA: Diagnosis not present

## 2018-08-19 DIAGNOSIS — E1122 Type 2 diabetes mellitus with diabetic chronic kidney disease: Secondary | ICD-10-CM | POA: Diagnosis not present

## 2018-08-19 DIAGNOSIS — E785 Hyperlipidemia, unspecified: Secondary | ICD-10-CM | POA: Diagnosis not present

## 2018-08-19 DIAGNOSIS — Z79899 Other long term (current) drug therapy: Secondary | ICD-10-CM

## 2018-08-19 DIAGNOSIS — R531 Weakness: Secondary | ICD-10-CM

## 2018-08-19 DIAGNOSIS — N183 Chronic kidney disease, stage 3 unspecified: Secondary | ICD-10-CM | POA: Diagnosis present

## 2018-08-19 DIAGNOSIS — I160 Hypertensive urgency: Secondary | ICD-10-CM | POA: Diagnosis present

## 2018-08-19 DIAGNOSIS — E1169 Type 2 diabetes mellitus with other specified complication: Secondary | ICD-10-CM | POA: Diagnosis present

## 2018-08-19 DIAGNOSIS — N390 Urinary tract infection, site not specified: Secondary | ICD-10-CM | POA: Diagnosis present

## 2018-08-19 DIAGNOSIS — I69354 Hemiplegia and hemiparesis following cerebral infarction affecting left non-dominant side: Secondary | ICD-10-CM

## 2018-08-19 DIAGNOSIS — R29703 NIHSS score 3: Secondary | ICD-10-CM | POA: Diagnosis not present

## 2018-08-19 DIAGNOSIS — Z7984 Long term (current) use of oral hypoglycemic drugs: Secondary | ICD-10-CM | POA: Diagnosis not present

## 2018-08-19 DIAGNOSIS — I63411 Cerebral infarction due to embolism of right middle cerebral artery: Secondary | ICD-10-CM | POA: Diagnosis not present

## 2018-08-19 DIAGNOSIS — I10 Essential (primary) hypertension: Secondary | ICD-10-CM | POA: Diagnosis not present

## 2018-08-19 DIAGNOSIS — I080 Rheumatic disorders of both mitral and aortic valves: Secondary | ICD-10-CM | POA: Diagnosis not present

## 2018-08-19 DIAGNOSIS — I639 Cerebral infarction, unspecified: Secondary | ICD-10-CM | POA: Diagnosis not present

## 2018-08-19 DIAGNOSIS — E1151 Type 2 diabetes mellitus with diabetic peripheral angiopathy without gangrene: Secondary | ICD-10-CM | POA: Diagnosis present

## 2018-08-19 DIAGNOSIS — K5900 Constipation, unspecified: Secondary | ICD-10-CM | POA: Diagnosis present

## 2018-08-19 DIAGNOSIS — I340 Nonrheumatic mitral (valve) insufficiency: Secondary | ICD-10-CM | POA: Diagnosis present

## 2018-08-19 DIAGNOSIS — H53462 Homonymous bilateral field defects, left side: Secondary | ICD-10-CM | POA: Diagnosis present

## 2018-08-19 DIAGNOSIS — I129 Hypertensive chronic kidney disease with stage 1 through stage 4 chronic kidney disease, or unspecified chronic kidney disease: Secondary | ICD-10-CM | POA: Diagnosis not present

## 2018-08-19 DIAGNOSIS — I63511 Cerebral infarction due to unspecified occlusion or stenosis of right middle cerebral artery: Secondary | ICD-10-CM | POA: Diagnosis not present

## 2018-08-19 DIAGNOSIS — Z8249 Family history of ischemic heart disease and other diseases of the circulatory system: Secondary | ICD-10-CM | POA: Diagnosis not present

## 2018-08-19 DIAGNOSIS — I471 Supraventricular tachycardia: Secondary | ICD-10-CM

## 2018-08-19 DIAGNOSIS — I5030 Unspecified diastolic (congestive) heart failure: Secondary | ICD-10-CM

## 2018-08-19 DIAGNOSIS — Z90710 Acquired absence of both cervix and uterus: Secondary | ICD-10-CM | POA: Diagnosis not present

## 2018-08-19 DIAGNOSIS — R000 Tachycardia, unspecified: Secondary | ICD-10-CM | POA: Diagnosis not present

## 2018-08-19 DIAGNOSIS — Z86000 Personal history of in-situ neoplasm of breast: Secondary | ICD-10-CM

## 2018-08-19 LAB — COMPREHENSIVE METABOLIC PANEL
ALBUMIN: 4.2 g/dL (ref 3.5–5.0)
ALT: 17 U/L (ref 0–44)
AST: 35 U/L (ref 15–41)
Alkaline Phosphatase: 51 U/L (ref 38–126)
Anion gap: 10 (ref 5–15)
BILIRUBIN TOTAL: 1.1 mg/dL (ref 0.3–1.2)
BUN: 29 mg/dL — AB (ref 8–23)
CO2: 24 mmol/L (ref 22–32)
Calcium: 9.2 mg/dL (ref 8.9–10.3)
Chloride: 98 mmol/L (ref 98–111)
Creatinine, Ser: 1.63 mg/dL — ABNORMAL HIGH (ref 0.44–1.00)
GFR calc Af Amer: 33 mL/min — ABNORMAL LOW (ref >60)
GFR calc non Af Amer: 28 mL/min — ABNORMAL LOW (ref >60)
GLUCOSE: 190 mg/dL — AB (ref 70–99)
POTASSIUM: 3.7 mmol/L (ref 3.5–5.1)
SODIUM: 132 mmol/L — AB (ref 135–145)
Total Protein: 7.2 g/dL (ref 6.5–8.1)

## 2018-08-19 LAB — DIFFERENTIAL
Abs Immature Granulocytes: 0.02 10*3/uL (ref 0.00–0.07)
BASOS ABS: 0.1 10*3/uL (ref 0.0–0.1)
Basophils Relative: 1 %
EOS ABS: 0.2 10*3/uL (ref 0.0–0.5)
Eosinophils Relative: 1 %
IMMATURE GRANULOCYTES: 0 %
LYMPHS ABS: 3.6 10*3/uL (ref 0.7–4.0)
Lymphocytes Relative: 33 %
Monocytes Absolute: 1.1 10*3/uL — ABNORMAL HIGH (ref 0.1–1.0)
Monocytes Relative: 10 %
Neutro Abs: 6.1 10*3/uL (ref 1.7–7.7)
Neutrophils Relative %: 55 %

## 2018-08-19 LAB — RAPID URINE DRUG SCREEN, HOSP PERFORMED
AMPHETAMINES: NOT DETECTED
BENZODIAZEPINES: NOT DETECTED
Barbiturates: NOT DETECTED
Cocaine: NOT DETECTED
OPIATES: NOT DETECTED
Tetrahydrocannabinol: NOT DETECTED

## 2018-08-19 LAB — URINALYSIS, ROUTINE W REFLEX MICROSCOPIC
Bilirubin Urine: NEGATIVE
Glucose, UA: NEGATIVE mg/dL
Ketones, ur: NEGATIVE mg/dL
Nitrite: NEGATIVE
PROTEIN: NEGATIVE mg/dL
Specific Gravity, Urine: 1.011 (ref 1.005–1.030)
pH: 5 (ref 5.0–8.0)

## 2018-08-19 LAB — CBC
HCT: 39.5 % (ref 36.0–46.0)
Hemoglobin: 13 g/dL (ref 12.0–15.0)
MCH: 29.1 pg (ref 26.0–34.0)
MCHC: 32.9 g/dL (ref 30.0–36.0)
MCV: 88.6 fL (ref 80.0–100.0)
NRBC: 0 % (ref 0.0–0.2)
PLATELETS: 155 10*3/uL (ref 150–400)
RBC: 4.46 MIL/uL (ref 3.87–5.11)
RDW: 12.3 % (ref 11.5–15.5)
WBC: 11.1 10*3/uL — ABNORMAL HIGH (ref 4.0–10.5)

## 2018-08-19 LAB — ETHANOL: Alcohol, Ethyl (B): <10 mg/dL (ref <10)

## 2018-08-19 LAB — PROTIME-INR
INR: 1.04
Prothrombin Time: 13.5 seconds (ref 11.4–15.2)

## 2018-08-19 LAB — I-STAT CHEM 8, ED
BUN: 29 mg/dL — AB (ref 8–23)
CALCIUM ION: 1.15 mmol/L (ref 1.15–1.40)
CREATININE: 1.6 mg/dL — AB (ref 0.44–1.00)
Chloride: 98 mmol/L (ref 98–111)
Glucose, Bld: 185 mg/dL — ABNORMAL HIGH (ref 70–99)
HCT: 39 % (ref 36.0–46.0)
Hemoglobin: 13.3 g/dL (ref 12.0–15.0)
Potassium: 3.8 mmol/L (ref 3.5–5.1)
Sodium: 133 mmol/L — ABNORMAL LOW (ref 135–145)
TCO2: 22 mmol/L (ref 22–32)

## 2018-08-19 LAB — CBG MONITORING, ED: GLUCOSE-CAPILLARY: 199 mg/dL — AB (ref 70–99)

## 2018-08-19 LAB — ECHOCARDIOGRAM COMPLETE
Height: 66 in
WEIGHTICAEL: 2912 [oz_av]

## 2018-08-19 LAB — APTT: aPTT: 25 seconds (ref 24–36)

## 2018-08-19 LAB — I-STAT TROPONIN, ED: Troponin i, poc: 0.02 ng/mL (ref 0.00–0.08)

## 2018-08-19 LAB — GLUCOSE, CAPILLARY: Glucose-Capillary: 171 mg/dL — ABNORMAL HIGH (ref 70–99)

## 2018-08-19 MED ORDER — ASPIRIN 300 MG RE SUPP
300.0000 mg | Freq: Every day | RECTAL | Status: DC
  Filled 2018-08-19: qty 1

## 2018-08-19 MED ORDER — ACETAMINOPHEN 325 MG PO TABS: 650.0000 mg | ORAL_TABLET | ORAL | Status: DC | PRN

## 2018-08-19 MED ORDER — SODIUM CHLORIDE 0.9 % IV SOLN
1.0000 g | Freq: Once | INTRAVENOUS | Status: AC
  Administered 2018-08-19: 1 g via INTRAVENOUS
  Filled 2018-08-19: qty 10

## 2018-08-19 MED ORDER — ENOXAPARIN SODIUM 40 MG/0.4ML ~~LOC~~ SOLN
40.0000 mg | SUBCUTANEOUS | Status: DC
  Administered 2018-08-19 – 2018-08-23 (×5): 40 mg via SUBCUTANEOUS
  Filled 2018-08-19 (×5): qty 0.4

## 2018-08-19 MED ORDER — INSULIN ASPART 100 UNIT/ML ~~LOC~~ SOLN
0.0000 [IU] | Freq: Three times a day (TID) | SUBCUTANEOUS | Status: DC
  Administered 2018-08-19 – 2018-08-20 (×2): 3 [IU] via SUBCUTANEOUS
  Administered 2018-08-20: 2 [IU] via SUBCUTANEOUS
  Administered 2018-08-20: 3 [IU] via SUBCUTANEOUS
  Administered 2018-08-21: 2 [IU] via SUBCUTANEOUS
  Administered 2018-08-21: 3 [IU] via SUBCUTANEOUS
  Administered 2018-08-21 – 2018-08-22 (×2): 2 [IU] via SUBCUTANEOUS
  Administered 2018-08-22: 3 [IU] via SUBCUTANEOUS
  Administered 2018-08-22: 2 [IU] via SUBCUTANEOUS
  Administered 2018-08-23 – 2018-08-24 (×3): 3 [IU] via SUBCUTANEOUS

## 2018-08-19 MED ORDER — SIMVASTATIN 40 MG PO TABS
40.0000 mg | ORAL_TABLET | Freq: Every day | ORAL | Status: DC
  Administered 2018-08-19 – 2018-08-23 (×5): 40 mg via ORAL
  Filled 2018-08-19 (×5): qty 1

## 2018-08-19 MED ORDER — ACETAMINOPHEN 650 MG RE SUPP: 650.0000 mg | RECTAL | Status: DC | PRN

## 2018-08-19 MED ORDER — EZETIMIBE 10 MG PO TABS
10.0000 mg | ORAL_TABLET | Freq: Every day | ORAL | Status: DC
  Administered 2018-08-20 – 2018-08-24 (×4): 10 mg via ORAL
  Filled 2018-08-19 (×7): qty 1

## 2018-08-19 MED ORDER — INSULIN ASPART 100 UNIT/ML ~~LOC~~ SOLN: 0.0000 [IU] | Freq: Every day | SUBCUTANEOUS | Status: DC

## 2018-08-19 MED ORDER — SERTRALINE HCL 50 MG PO TABS
25.0000 mg | ORAL_TABLET | Freq: Every day | ORAL | Status: DC
  Administered 2018-08-19 – 2018-08-24 (×5): 25 mg via ORAL
  Filled 2018-08-19 (×5): qty 1

## 2018-08-19 MED ORDER — STROKE: EARLY STAGES OF RECOVERY BOOK
Freq: Once | Status: AC
  Administered 2018-08-19: 19:00:00
  Filled 2018-08-19: qty 1

## 2018-08-19 MED ORDER — EZETIMIBE-SIMVASTATIN 10-40 MG PO TABS: 1.0000 | ORAL_TABLET | Freq: Every day | ORAL | Status: DC

## 2018-08-19 MED ORDER — ACETAMINOPHEN 160 MG/5ML PO SOLN: 650.0000 mg | ORAL | Status: DC | PRN

## 2018-08-19 MED ORDER — ASPIRIN 325 MG PO TABS
325.0000 mg | ORAL_TABLET | Freq: Every day | ORAL | Status: DC
  Administered 2018-08-19 – 2018-08-24 (×6): 325 mg via ORAL
  Filled 2018-08-19 (×6): qty 1

## 2018-08-19 MED ORDER — SODIUM CHLORIDE 0.9 % IV SOLN
1.0000 g | INTRAVENOUS | Status: AC
  Administered 2018-08-20 – 2018-08-21 (×2): 1 g via INTRAVENOUS
  Filled 2018-08-19 (×2): qty 10

## 2018-08-19 MED ORDER — SODIUM CHLORIDE 0.9 % IV BOLUS
500.0000 mL | Freq: Once | INTRAVENOUS | Status: AC
  Administered 2018-08-19: 500 mL via INTRAVENOUS

## 2018-08-19 MED ORDER — SERTRALINE HCL 50 MG PO TABS: 25.0000 mg | ORAL_TABLET | Freq: Every day | ORAL | Status: DC

## 2018-08-19 MED ORDER — SODIUM CHLORIDE 0.9 % IV SOLN
INTRAVENOUS | Status: DC
  Administered 2018-08-19 – 2018-08-22 (×4): via INTRAVENOUS

## 2018-08-19 MED ORDER — HYDROCODONE-ACETAMINOPHEN 5-325 MG PO TABS: 1.0000 | ORAL_TABLET | Freq: Four times a day (QID) | ORAL | Status: DC | PRN

## 2018-08-19 MED ORDER — SENNOSIDES-DOCUSATE SODIUM 8.6-50 MG PO TABS: 1.0000 | ORAL_TABLET | Freq: Every evening | ORAL | Status: DC | PRN

## 2018-08-19 NOTE — ED Notes (Signed)
Spoke with [facility] to send a truck to transport pt to [facility].

## 2018-08-19 NOTE — ED Notes (Signed)
Echo in progress.

## 2018-08-19 NOTE — ED Notes (Signed)
EDP at bedside  

## 2018-08-19 NOTE — ED Provider Notes (Signed)
[facility] EMERGENCY DEPARTMENT Provider Note   CSN: [id number] Arrival date & time: 08/19/18  0920     History   Chief Complaint Chief Complaint  Patient presents with  . Extremity Weakness  . Facial Droop    HPI [name] is a 84 y.o. female.  HPI Patient lives alone.  Presents with slurred speech, left-sided facial droop and left-sided weakness that her daughter noticed this morning.  She had an unwitnessed trip and fall yesterday evening where she sustained a right forearm skin tear.  No speech changes or facial droop was noted before she went to bed at 9 PM.  Denied hitting her head or loss of consciousness.  Patient currently is denying any pain.  She is mildly confused and repetitive.  Level 5 caveat applies. Past Medical History:  Diagnosis Date  . 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 Diabetes mellitus   . GERD (gastroesophageal reflux disease)   . High cholesterol   . History of TIA (transient ischemic attack)   . Hypertension   . Stroke [facility])     There are no active problems to display for this patient.   Past Surgical History:  Procedure Laterality Date  . ABDOMINAL HYSTERECTOMY    . BREAST LUMPECTOMY    . CESAREAN SECTION       OB History   None      Home Medications    Prior to Admission medications   Medication Sig Start Date End Date Taking? Authorizing Provider  acetaminophen (TYLENOL) 650 MG CR tablet Take 650 mg by mouth every 8 (eight) hours as needed for pain.     [provider]  cephALEXin (KEFLEX) 500 MG capsule Take 1 capsule (500 mg total) by mouth 2 (two) times daily. Patient not taking: Reported on 09/11/2016 05/22/16   Ezequiel Essex, MD  ezetimibe-simvastatin (VYTORIN) 10-40 MG per tablet Take 1 tablet by mouth daily.     [provider]  furosemide (LASIX) 20 MG tablet Take 20 mg by mouth daily.    [provider]  HYDROcodone-acetaminophen (NORCO/VICODIN) 5-325 MG  tablet 1 at hs or q6h prn pain. Take this medication with food. 09/11/16   Lily Kocher, PA-C  lisinopril (PRINIVIL,ZESTRIL) 10 MG tablet Take 10 mg by mouth daily.      [provider]  pioglitazone (ACTOS) 15 MG tablet Take 15 mg by mouth at bedtime.  05/19/16   [provider]  sertraline (ZOLOFT) 25 MG tablet Take 25 mg by mouth daily. 03/27/15   [provider]  TOUJEO SOLOSTAR 300 UNIT/ML SOPN Inject 8-20 Units into the skin 2 (two) times daily. 20 units in the morning and 8 units at bedtime 05/21/16   [provider]    Family History Family History  Problem Relation Age of Onset  . Cancer Mother   . Hypertension Father     Social History Social History   Tobacco Use  . Smoking status: Never Smoker  . Smokeless tobacco: Never Used  Substance Use Topics  . Alcohol use: No  . Drug use: No     Allergies   Codeine and Penicillins   Review of Systems Review of Systems  Unable to perform ROS: Mental status change  Skin: Positive for wound.  Psychiatric/Behavioral: Positive for confusion.     Physical Exam Updated Vital Signs BP (!) 179/166   Pulse (!) 111   Temp 98 F (36.7 C) (Oral)   Resp  17   Ht 5\' 6"  (1.676 m)   Wt 82.6 kg   SpO2 98%   BMI 29.38 kg/m   Physical Exam  Constitutional: She appears well-developed and well-nourished.  HENT:  Head: Normocephalic and atraumatic.  Mouth/Throat: Oropharynx is clear and moist.  No obvious head trauma.  No intraoral trauma.  Left lower facial droop.  Eyes: Pupils are equal, round, and reactive to light.  Neck: Normal range of motion. Neck supple.  No posterior midline cervical tenderness to palpation.  Cardiovascular: Normal rate and regular rhythm. Exam reveals no gallop and no friction rub.  No murmur heard. Pulmonary/Chest: Effort normal and breath sounds normal.  Abdominal: Soft. Bowel sounds are normal. There is no tenderness. There is no rebound and no guarding.    Musculoskeletal: Normal range of motion. She exhibits no edema or tenderness.  Neurological: She is alert.  Repetitive.  4/5 motor in left upper and left lower extremities.  Left lower facial droop.  Question some degree of left-sided neglect.  Mildly slurred speech though still understandable.  Skin: Skin is warm and dry. Capillary refill takes less than 2 seconds. No rash noted. No erythema.  Psychiatric: She has a normal mood and affect. Her behavior is normal.  Nursing note and vitals reviewed.    ED Treatments / Results  Labs (all labs ordered are listed, but only abnormal results are displayed) Labs Reviewed  CBC - Abnormal; Notable for the following components:      Result Value   WBC 11.1 (*)    All other components within normal limits  DIFFERENTIAL - Abnormal; Notable for the following components:   Monocytes Absolute 1.1 (*)    All other components within normal limits  COMPREHENSIVE METABOLIC PANEL - Abnormal; Notable for the following components:   Sodium 132 (*)    Glucose, Bld 190 (*)    BUN 29 (*)    Creatinine, Ser 1.63 (*)    GFR calc non Af Amer 28 (*)    GFR calc Af Amer 33 (*)    All other components within normal limits  URINALYSIS, ROUTINE W REFLEX MICROSCOPIC - Abnormal; Notable for the following components:   APPearance CLOUDY (*)    Hgb urine dipstick SMALL (*)    Leukocytes, UA LARGE (*)    WBC, UA >50 (*)    Bacteria, UA MANY (*)    All other components within normal limits  I-STAT CHEM 8, ED - Abnormal; Notable for the following components:   Sodium 133 (*)    BUN 29 (*)    Creatinine, Ser 1.60 (*)    Glucose, Bld 185 (*)    All other components within normal limits  ETHANOL  PROTIME-INR  APTT  RAPID URINE DRUG SCREEN, HOSP PERFORMED  I-STAT TROPONIN, ED    EKG EKG Interpretation  Date/Time:  Thursday August 19 2018 09:38:11 EDT Ventricular Rate:  123 PR Interval:    QRS Duration: 89 QT Interval:  364 QTC Calculation: 521 R  Axis:   3 Text Interpretation:  Junctional tachycardia Inferior infarct, age indeterminate Prolonged QT interval Confirmed by Julianne Rice [phone]) on 08/19/2018 11:24:09 AM   Radiology Ct Head Code Stroke Wo Contrast  Result Date: 08/19/2018 CLINICAL DATA:  Code stroke. 84 year old female with left side weakness. Last seen well 2130 hours yesterday. EXAM: CT HEAD WITHOUT CONTRAST TECHNIQUE: Contiguous axial images were obtained from the base of the skull through the vertex without intravenous contrast. COMPARISON:  Head CT 01/01/2016 and earlier. FINDINGS:  Brain: Confluent cytotoxic edema encompassing an area of 8 centimeters in the right hemisphere corresponding to the posterior right MCA territory. Posterior right insula and temporal lobe involvement. There is some superimposed chronic encephalomalacia underlying the acute ischemia as seen in 2017. There is no associated hemorrhage or significant mass effect. Superimposed much smaller area of cortical and subcortical white matter hypodensity in the contralateral posterior left MCA territory best seen on series 2, image 16. This is new since 2017. Chronic but increased hypodensity in the bilateral corona radiata and left thalamus since 2017. Chronic cerebellar infarcts appear stable and greater in the right hemisphere. Stable brainstem. No midline shift, ventriculomegaly. Vascular: Calcified atherosclerosis at the skull base. No suspicious intracranial vascular hyperdensity. Skull: No acute osseous abnormality identified. Hyperostosis of the calvarium, normal variant. Sinuses/Orbits: Chronic sphenoid mucoperiosteal thickening. Mild left sphenoid mucosal thickening and bubbly opacity is chronic but increased. Tympanic cavities and mastoids remain clear. Other: No acute orbit or scalp soft tissue findings. ASPECTS [facility] Stroke Program Early CT Score) - Ganglionic level infarction (caudate, lentiform nuclei, internal capsule, insula, M1-M3 cortex): 4 -  Supraganglionic infarction (M4-M6 cortex): 1 Total score (0-10 with 10 being normal): 5 (in the right hemisphere). IMPRESSION: 1. Acute on chronic posterior Right MCA territory infarct. No hemorrhage or mass effect. ASPECTS 5. 2. Superimposed age indeterminate much smaller posterior Left MCA territory ischemia (series 2, image 16), new since 2017. 3. Underlying chronic small vessel disease and chronic bilateral cerebellar infarcts. 4. Study discussed by telephone with Dr. Julianne Rice on 08/19/2018 at 10:06 . Electronically Signed   By: Genevie Ann M.D.   On: 08/19/2018 10:07    Procedures Procedures (including critical care time)  Medications Ordered in ED Medications  sodium chloride 0.9 % bolus 500 mL (500 mLs Intravenous Canceled Entry 08/19/18 1026)     Initial Impression / Assessment and Plan / ED Course  I have reviewed the triage vital signs and the nursing notes.  Pertinent labs & imaging results that were available during my care of the patient were reviewed by me and considered in my medical decision making (see chart for details).    Evaluated by tele-neurology.  Not TPA candidate.  CT with evidence of acute on chronic right MCA stroke.  Discussed with hospitalist will see patient in the emergency department and admit.  Final Clinical Impressions(s) / ED Diagnoses   Final diagnoses:  Ischemic stroke [facility])    ED Discharge Orders    None       Julianne Rice, MD 08/20/18 1512

## 2018-08-19 NOTE — Progress Notes (Signed)
Echocardiogram 2D Echocardiogram has been performed.  Rebecca Hardy 08/19/2018, 3:26 PM

## 2018-08-19 NOTE — ED Notes (Signed)
Report given to [facility]. 

## 2018-08-19 NOTE — ED Notes (Signed)
[facility] here to transport patient. 

## 2018-08-19 NOTE — ED Notes (Signed)
Pt back from CT at this time 

## 2018-08-19 NOTE — Consult Note (Signed)
Referring Physician: Dr. Candiss Norse    Chief Complaint: New onset slurred speech and left sided weakness  HPI: Rebecca Hardy is an 84 y.o. female who presented to the [facility] ED on Thursday morning for evaluation of slurred speech and left sided weakness. Her PMHx includes HTN, DM, HLD, CKD3 and prior CVA. When she was visited by her daughter on Wednesday night, it was noted that patient had had a fall and was on the ground.  Was feeling "okay" after she was helped up, with no complaints of weakness or slurred speech evident at that time. Overnight, the patient had been able to walk to the bathroom without difficulty. On Thursday morning, the patient's daughter noticed that her mother had slurred speech. Daughter left for work and when patient's son later came by, he noted that he speech was still slurred and that she had left sided facial droop.   She was brought to the [facility] ED where Code Stroke was intitiated and Teleneurology consulted; tPA was not given due to time criteria. CT head showed hypodensity in the right MCA territory, suggestive of a subacute ischemic infarction. Due to elevated Cr, CTA was not performed. She was also felt not to be a VIR candidate by the Teleneurologist. U/A showed possible infection.   CT head: 1. Acute on chronic posterior Right MCA territory infarct. No hemorrhage or mass effect. ASPECTS 5. 2. Superimposed age indeterminate much smaller posterior Left MCA territory ischemia (series 2, image 16), new since 2017. 3. Underlying chronic small vessel disease and chronic bilateral cerebellar infarcts  Past Medical History:  Diagnosis Date  . 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 Diabetes mellitus   . GERD (gastroesophageal reflux disease)   . High cholesterol   . History of TIA (transient ischemic attack)   . Hypertension   . Stroke [facility])     Past Surgical History:  Procedure Laterality Date  . ABDOMINAL HYSTERECTOMY    .  BREAST LUMPECTOMY    . CESAREAN SECTION      Family History  Problem Relation Age of Onset  . Cancer Mother   . Hypertension Father    Social History:  reports that she has never smoked. She has never used smokeless tobacco. She reports that she does not drink alcohol or use drugs.  Allergies:  Allergies  Allergen Reactions  . Codeine Other (See Comments)    Unknown reaction  . Penicillins Other (See Comments)    Unknown reaction  Has patient had a PCN reaction causing immediate rash, facial/tongue/throat swelling, SOB or lightheadedness with hypotension: Uknown Has patient had a PCN reaction causing severe rash involving mucus membranes or skin necrosis: Uknown Has patient had a PCN reaction that required hospitalization: Unknown Has patient had a PCN reaction occurring within the last 10 years: No If all of the above answers are "NO", then may proceed with Cephalosporin use.    Medications:  Prior to Admission:  Medications Prior to Admission  Medication Sig Dispense Refill Last Dose  . ezetimibe (ZETIA) 10 MG tablet Take 10 mg by mouth daily.   0 08/18/2018 at Unknown time  . furosemide (LASIX) 20 MG tablet Take 40 mg by mouth daily.    08/18/2018 at Unknown time  . GLIPIZIDE XL 5 MG 24 hr tablet Take 5 mg by mouth.   1 08/18/2018 at Unknown time  . lisinopril (PRINIVIL,ZESTRIL) 10 MG tablet Take 20 mg by mouth daily.  08/18/2018 at Unknown time  . potassium chloride (K-DUR) 10 MEQ tablet Take 10 mEq by mouth once.   4 08/18/2018 at Unknown time  . rosuvastatin (CRESTOR) 20 MG tablet Take 20 mg by mouth daily.   1 08/18/2018 at Unknown time  . sertraline (ZOLOFT) 25 MG tablet Take 25 mg by mouth at bedtime.   2 08/18/2018 at Unknown time  . TOUJEO SOLOSTAR 300 UNIT/ML SOPN Inject 50 Units into the skin 2 (two) times daily. 20 units in the morning and 8 units at bedtime  2 08/18/2018 at 0800am   Scheduled: . aspirin  300 mg Rectal Daily   Or  . aspirin  325 mg Oral Daily   . enoxaparin (LOVENOX) injection  40 mg Subcutaneous Q24H  . ezetimibe  10 mg Oral Daily  . insulin aspart  0-15 Units Subcutaneous TID WC  . insulin aspart  0-5 Units Subcutaneous QHS  . sertraline  25 mg Oral Daily  . simvastatin  40 mg Oral q1800   Continuous: . sodium chloride Stopped (08/20/18 0324)  . cefTRIAXone (ROCEPHIN)  IV      ROS: No headache, neck pain, chest pain, vision loss or lateralized limb weakness. Other ROS as per HPI.   Physical Examination: Blood pressure (!) 133/54, pulse (!) 111, temperature (!) 97.5 F (36.4 C), temperature source Oral, resp. rate 20, height _0  (1.676 m), weight 82.6 kg, SpO2 100 %.  HEENT: [place]/AT Lungs: Respirations unlabored Ext: Warm and well-perfused. Mild pitting edema with hyperpigmentation and trophic changes to distal lower extremities.   Neurologic Examination: Mental Status: Awake and alert. Poorly oriented, stating it is "Saturday", "March" and "[place]". Able to identify the state correctly. Unable to state the year. Speech fluent without dysarthria. Able to name 2/3 objects. Mild deficit with repetition. Able to follow all simple motor commands.  Cranial Nerves: II:  Visual fields intact with no extinction to DSS. PERRL. III,IV, VI: Has difficulty with saccades and visual pursuits to the left, but able to cross the midline to the left. No nystagmus. No ptosis.  V,VII: No facial droop. Facial sensation to temp intact.  VIII: hearing intact to voice IX,X: No hypophonia XI: Symmetric XII: midline tongue extension  Motor: 4+/5 bilateral upper and lower extremities, proximally and distally.  Sensory: Temp sensation intact proximally x 4. With fine touch, there is extinction on the left.  Deep Tendon Reflexes:  1+ bilateral brachioradialis and biceps. 1+ patellae. 0 achilles.  Cerebellar: Mild dysmetria with FNF bilaterally Gait: Deferred.    Results for orders placed or performed during the hospital encounter of  08/19/18 (from the past 48 hour(s))  Ethanol     Status: None   Collection Time: 08/19/18  9:35 AM  Result Value Ref Range   Alcohol, Ethyl (B) <10 <10 mg/dL    Comment: (NOTE) Lowest detectable limit for serum alcohol is 10 mg/dL. For medical purposes only. Performed at [facility], [address], [place], [place] [address]   Protime-INR     Status: None   Collection Time: 08/19/18  9:35 AM  Result Value Ref Range   Prothrombin Time 13.5 11.4 - 15.2 seconds   INR 1.04     Comment: Performed at [facility], [address], [place], [place] [address]  APTT     Status: None   Collection Time: 08/19/18  9:35 AM  Result Value Ref Range   aPTT 25 24 - 36 seconds    Comment: Performed at [facility], [address], [place], [place] [address]  CBC     Status: Abnormal   Collection Time: 08/19/18  9:35 AM  Result Value Ref Range   WBC 11.1 (H) 4.0 - 10.5 K/uL   RBC 4.46 3.87 - 5.11 MIL/uL   Hemoglobin 13.0 12.0 - 15.0 g/dL   HCT 39.5 36.0 - 46.0 %   MCV 88.6 80.0 - 100.0 fL   MCH 29.1 26.0 - 34.0 pg   MCHC 32.9 30.0 - 36.0 g/dL   RDW 12.3 11.5 - 15.5 %   Platelets 155 150 - 400 K/uL   nRBC 0.0 0.0 - 0.2 %    Comment: Performed at [facility], [address], [place], [place] [address]  Differential     Status: Abnormal   Collection Time: 08/19/18  9:35 AM  Result Value Ref Range   Neutrophils Relative % 55 %   Neutro Abs 6.1 1.7 - 7.7 K/uL   Lymphocytes Relative 33 %   Lymphs Abs 3.6 0.7 - 4.0 K/uL   Monocytes Relative 10 %   Monocytes Absolute 1.1 (H) 0.1 - 1.0 K/uL   Eosinophils Relative 1 %   Eosinophils Absolute 0.2 0.0 - 0.5 K/uL   Basophils Relative 1 %   Basophils Absolute 0.1 0.0 - 0.1 K/uL   Immature Granulocytes 0 %   Abs Immature Granulocytes 0.02 0.00 - 0.07 K/uL    Comment: Performed at [facility], [address], [place], [place] [address]  Comprehensive metabolic panel     Status: Abnormal   Collection Time: 08/19/18  9:35 AM  Result Value Ref Range    Sodium 132 (L) 135 - 145 mmol/L   Potassium 3.7 3.5 - 5.1 mmol/L   Chloride 98 98 - 111 mmol/L   CO2 24 22 - 32 mmol/L   Glucose, Bld 190 (H) 70 - 99 mg/dL   BUN 29 (H) 8 - 23 mg/dL   Creatinine, Ser 1.63 (H) 0.44 - 1.00 mg/dL   Calcium 9.2 8.9 - 10.3 mg/dL   Total Protein 7.2 6.5 - 8.1 g/dL   Albumin 4.2 3.5 - 5.0 g/dL   AST 35 15 - 41 U/L   ALT 17 0 - 44 U/L   Alkaline Phosphatase 51 38 - 126 U/L   Total Bilirubin 1.1 0.3 - 1.2 mg/dL   GFR calc non Af Amer 28 (L) >60 mL/min   GFR calc Af Amer 33 (L) >60 mL/min    Comment: (NOTE) The eGFR has been calculated using the CKD EPI equation. This calculation has not been validated in all clinical situations. eGFR's persistently <60 mL/min signify possible Chronic Kidney Disease.    Anion gap 10 5 - 15    Comment: Performed at [facility], [address] [place], [place] [address]  Urine rapid drug screen (hosp performed)     Status: None   Collection Time: 08/19/18  9:35 AM  Result Value Ref Range   Opiates NONE DETECTED NONE DETECTED   Cocaine NONE DETECTED NONE DETECTED   Benzodiazepines NONE DETECTED NONE DETECTED   Amphetamines NONE DETECTED NONE DETECTED   Tetrahydrocannabinol NONE DETECTED NONE DETECTED   Barbiturates NONE DETECTED NONE DETECTED    Comment: (NOTE) DRUG SCREEN FOR MEDICAL PURPOSES ONLY.  IF CONFIRMATION IS NEEDED FOR ANY PURPOSE, NOTIFY LAB WITHIN 5 DAYS. LOWEST DETECTABLE LIMITS FOR URINE DRUG SCREEN Drug Class                     Cutoff (ng/mL) Amphetamine and  metabolites    1000 Barbiturate and metabolites    200 Benzodiazepine                 630 Tricyclics and metabolites     300 Opiates and metabolites        300 Cocaine and metabolites        300 THC                            50 Performed at [facility], [address], [place], [place] [address]   Urinalysis, Routine w reflex microscopic     Status: Abnormal   Collection Time: 08/19/18  9:35 AM  Result Value Ref Range   Color, Urine  YELLOW YELLOW   APPearance CLOUDY (A) CLEAR   Specific Gravity, Urine 1.011 1.005 - 1.030   pH 5.0 5.0 - 8.0   Glucose, UA NEGATIVE NEGATIVE mg/dL   Hgb urine dipstick SMALL (A) NEGATIVE   Bilirubin Urine NEGATIVE NEGATIVE   Ketones, ur NEGATIVE NEGATIVE mg/dL   Protein, ur NEGATIVE NEGATIVE mg/dL   Nitrite NEGATIVE NEGATIVE   Leukocytes, UA LARGE (A) NEGATIVE   RBC / HPF 0-5 0 - 5 RBC/hpf   WBC, UA >50 (H) 0 - 5 WBC/hpf   Bacteria, UA MANY (A) NONE SEEN   Squamous Epithelial / LPF 0-5 0 - 5   WBC Clumps PRESENT     Comment: Performed at [facility], [address], [place], [place] [address]  I-stat troponin, ED     Status: None   Collection Time: 08/19/18  9:44 AM  Result Value Ref Range   Troponin i, poc 0.02 0.00 - 0.08 ng/mL   Comment 3            Comment: Due to the release kinetics of cTnI, a negative result within the first hours of the onset of symptoms does not rule out myocardial infarction with certainty. If myocardial infarction is still suspected, repeat the test at appropriate intervals.   I-Stat Chem 8, ED     Status: Abnormal   Collection Time: 08/19/18  9:46 AM  Result Value Ref Range   Sodium 133 (L) 135 - 145 mmol/L   Potassium 3.8 3.5 - 5.1 mmol/L   Chloride 98 98 - 111 mmol/L   BUN 29 (H) 8 - 23 mg/dL   Creatinine, Ser 1.60 (H) 0.44 - 1.00 mg/dL   Glucose, Bld 185 (H) 70 - 99 mg/dL   Calcium, Ion 1.15 1.15 - 1.40 mmol/L   TCO2 22 22 - 32 mmol/L   Hemoglobin 13.3 12.0 - 15.0 g/dL   HCT 39.0 36.0 - 46.0 %  CBG monitoring, ED     Status: Abnormal   Collection Time: 08/19/18  4:12 PM  Result Value Ref Range   Glucose-Capillary 199 (H) 70 - 99 mg/dL   Mr Brain Wo Contrast  Result Date: 08/19/2018 CLINICAL DATA:  Fall x2. This occurred yesterday. Last seen normal at 9 p.m. 08/18/2018. Slurred speech and LEFT-sided weakness are noted upon awakening today. EXAM: MRI HEAD WITHOUT CONTRAST MRA HEAD WITHOUT CONTRAST TECHNIQUE: Multiplanar, multiecho pulse  sequences of the brain and surrounding structures were obtained without intravenous contrast. Angiographic images of the head were obtained using MRA technique without contrast. COMPARISON:  CT code stroke 08/19/2018.  CT head 01/01/2016. FINDINGS: Motion degraded exam. MRI HEAD FINDINGS Brain: Large area of restricted diffusion corresponding low ADC, affecting the RIGHT MCA territory as predicted from  CT, representing acute RIGHT MCA posterior division M2 thrombosis with acute infarction. This affects the frontal, temporal, parietal, and insular lobes, as well as adjacent white matter. There may be posterior limb internal capsule involvement, but the lentiform nucleus is largely spared. No hemorrhage, mass lesion, hydrocephalus, or extra-axial fluid. Generalized atrophy. Hypoattenuation of white matter, likely small vessel disease. Chronic deep white matter lacunar infarcts most notable RIGHT frontal subcortical white matter adjacent to caudate, also chronic LEFT and RIGHT parietal cortical infarcts. BILATERAL cerebellar infarcts also noted of a chronic nature. Vascular: Reported separately Skull and upper cervical spine: Normal marrow Sinuses/Orbits: No significant opacity or layering fluid. BILATERAL cataract extraction. Other: None MRA HEAD FINDINGS Motion degraded exam.  Small or subtle lesions could be overlooked. Gross patency of the internal carotid arteries bilaterally without visible flow reducing stenosis. Diffusely narrowed LEFT A1 ACA, with dominant RIGHT anterior cerebral artery largely supplying both A2 and A3 segments. Unremarkable LEFT M1 MCA and proximal M2 segments. Proximal RIGHT M1 MCA is patent, but there is some loss of signal in its distal aspect. The anterior division of the M2 MCA is patent, but there is no posterior division flow related enhancement observed consistent with thrombosis contributing to the acute pattern of infarction. The basilar artery is widely patent. Artifactual  misregistration in its midportion. Both vertebrals supply the basilar. Both PCA segments are unremarkable proximally. Cerebellar branches poorly visualized. No definite saccular aneurysm. IMPRESSION: Large, nonhemorrhagic, acute infarction involving RIGHT MCA posterior M2 division territory, with corresponding thrombosis of that vessel on MRA. No significant midline shift or brain swelling at this time. Superimposed chronic ischemia in the brain, and chronic microvascular ischemic change. Electronically Signed   By: Staci Righter M.D.   On: 08/19/2018 15:04   Mr Jodene Nam Head/brain SL Cm  Result Date: 08/19/2018 CLINICAL DATA:  Fall x2. This occurred yesterday. Last seen normal at 9 p.m. 08/18/2018. Slurred speech and LEFT-sided weakness are noted upon awakening today. EXAM: MRI HEAD WITHOUT CONTRAST MRA HEAD WITHOUT CONTRAST TECHNIQUE: Multiplanar, multiecho pulse sequences of the brain and surrounding structures were obtained without intravenous contrast. Angiographic images of the head were obtained using MRA technique without contrast. COMPARISON:  CT code stroke 08/19/2018.  CT head 01/01/2016. FINDINGS: Motion degraded exam. MRI HEAD FINDINGS Brain: Large area of restricted diffusion corresponding low ADC, affecting the RIGHT MCA territory as predicted from CT, representing acute RIGHT MCA posterior division M2 thrombosis with acute infarction. This affects the frontal, temporal, parietal, and insular lobes, as well as adjacent white matter. There may be posterior limb internal capsule involvement, but the lentiform nucleus is largely spared. No hemorrhage, mass lesion, hydrocephalus, or extra-axial fluid. Generalized atrophy. Hypoattenuation of white matter, likely small vessel disease. Chronic deep white matter lacunar infarcts most notable RIGHT frontal subcortical white matter adjacent to caudate, also chronic LEFT and RIGHT parietal cortical infarcts. BILATERAL cerebellar infarcts also noted of a chronic  nature. Vascular: Reported separately Skull and upper cervical spine: Normal marrow Sinuses/Orbits: No significant opacity or layering fluid. BILATERAL cataract extraction. Other: None MRA HEAD FINDINGS Motion degraded exam.  Small or subtle lesions could be overlooked. Gross patency of the internal carotid arteries bilaterally without visible flow reducing stenosis. Diffusely narrowed LEFT A1 ACA, with dominant RIGHT anterior cerebral artery largely supplying both A2 and A3 segments. Unremarkable LEFT M1 MCA and proximal M2 segments. Proximal RIGHT M1 MCA is patent, but there is some loss of signal in its distal aspect. The anterior division of the M2 MCA  is patent, but there is no posterior division flow related enhancement observed consistent with thrombosis contributing to the acute pattern of infarction. The basilar artery is widely patent. Artifactual misregistration in its midportion. Both vertebrals supply the basilar. Both PCA segments are unremarkable proximally. Cerebellar branches poorly visualized. No definite saccular aneurysm. IMPRESSION: Large, nonhemorrhagic, acute infarction involving RIGHT MCA posterior M2 division territory, with corresponding thrombosis of that vessel on MRA. No significant midline shift or brain swelling at this time. Superimposed chronic ischemia in the brain, and chronic microvascular ischemic change. Electronically Signed   By: Staci Righter M.D.   On: 08/19/2018 15:04   Ct Head Code Stroke Wo Contrast  Result Date: 08/19/2018 CLINICAL DATA:  Code stroke. 84 year old female with left side weakness. Last seen well 2130 hours yesterday. EXAM: CT HEAD WITHOUT CONTRAST TECHNIQUE: Contiguous axial images were obtained from the base of the skull through the vertex without intravenous contrast. COMPARISON:  Head CT 01/01/2016 and earlier. FINDINGS: Brain: Confluent cytotoxic edema encompassing an area of 8 centimeters in the right hemisphere corresponding to the posterior  right MCA territory. Posterior right insula and temporal lobe involvement. There is some superimposed chronic encephalomalacia underlying the acute ischemia as seen in 2017. There is no associated hemorrhage or significant mass effect. Superimposed much smaller area of cortical and subcortical white matter hypodensity in the contralateral posterior left MCA territory best seen on series 2, image 16. This is new since 2017. Chronic but increased hypodensity in the bilateral corona radiata and left thalamus since 2017. Chronic cerebellar infarcts appear stable and greater in the right hemisphere. Stable brainstem. No midline shift, ventriculomegaly. Vascular: Calcified atherosclerosis at the skull base. No suspicious intracranial vascular hyperdensity. Skull: No acute osseous abnormality identified. Hyperostosis of the calvarium, normal variant. Sinuses/Orbits: Chronic sphenoid mucoperiosteal thickening. Mild left sphenoid mucosal thickening and bubbly opacity is chronic but increased. Tympanic cavities and mastoids remain clear. Other: No acute orbit or scalp soft tissue findings. ASPECTS [facility] Stroke Program Early CT Score) - Ganglionic level infarction (caudate, lentiform nuclei, internal capsule, insula, M1-M3 cortex): 4 - Supraganglionic infarction (M4-M6 cortex): 1 Total score (0-10 with 10 being normal): 5 (in the right hemisphere). IMPRESSION: 1. Acute on chronic posterior Right MCA territory infarct. No hemorrhage or mass effect. ASPECTS 5. 2. Superimposed age indeterminate much smaller posterior Left MCA territory ischemia (series 2, image 16), new since 2017. 3. Underlying chronic small vessel disease and chronic bilateral cerebellar infarcts. 4. Study discussed by telephone with Dr. Julianne Rice on 08/19/2018 at 10:06 . Electronically Signed   By: Genevie Ann M.D.   On: 08/19/2018 10:07    Assessment: 84 y.o. female presenting with large acute right MCA ischemic infarction.  1. Exam without definite  lateralizing findings except for difficulty tracking and initiating saccades to the left.  2.  MRI brain: Large, nonhemorrhagic, acute infarction involving RIGHT MCA posterior M2 division territory, with corresponding thrombosis of that vessel on MRA. No significant midline shift or brain swelling at this time. Superimposed chronic ischemia in the brain, and chronic microvascular ischemic change. 3. Stroke Risk Factors - DM, prior TIA and stroke, HTN, hypercholesterolemia  Plan: 1. HgbA1c, fasting lipid panel 2. Frequent neuro checks 3. PT consult, OT consult, Speech consult 4. Echocardiogram 5. Carotid dopplers 6. Prophylactic therapy- Agree with starting ASA. Continue statin.  7. Risk factor modification 8. Telemetry monitoring 9. Out of permissive HTN time window  _0  signed: Dr. Kerney Elbe 08/19/2018, 7:20 PM

## 2018-08-19 NOTE — ED Notes (Signed)
Patient transported to MRI 

## 2018-08-19 NOTE — H&P (Signed)
History and Physical    AMEILIA RATTAN LAG:[id number] DOB: 1933-06-27 DOA: 08/19/2018  PCP: Celene Squibb, MD  Patient coming from: home  I have personally briefly reviewed patient's old medical records in [place]  Chief Complaint: slurred speech, left sided weakness  HPI: [name] is a 84 y.o. female with medical history significant of HTN, DM, HLD, CKD3, prior CVA, is brought to the hospital for slurred speech and left sided weakness.  Patient was visited by her daughter last night and it was noted that patient had a fall and was on the ground.  She was helped up and was reportedly feeling okay.  No complaints of weakness at that time.  No slurring of her speech.  Her daughter stayed with her overnight and noted that patient had gotten up and walked to the bathroom several times without significant difficulty.  This morning, her daughter noticed that patient did have some slurring of her speech, but felt this was related to her still being sleepy and drowsy.  Her daughter left to go to work and patient's son came to check on her.  When he checked on her, her speech is still slurred and they had noticed left-sided facial droop.  Patient was brought to the hospital for evaluation.  ED Course: Code stroke was initiated, but patient was not felt to be a candidate for TPA since last well-known was about 4.5 hours.  CT head showed possible right MCA distribution infarct and possible left MCA ischemia.  Urinalysis indicated possible infection.  She is been referred for admission.  Review of Systems: As per HPI otherwise 10 point review of systems negative.    Past Medical History:  Diagnosis Date  . 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 Diabetes mellitus   . GERD (gastroesophageal reflux disease)   . High cholesterol   . History of TIA (transient ischemic attack)   . Hypertension   . Stroke [facility])     Past Surgical History:  Procedure Laterality Date    . ABDOMINAL HYSTERECTOMY    . BREAST LUMPECTOMY    . CESAREAN SECTION      Social History:  reports that she has never smoked. She has never used smokeless tobacco. She reports that she does not drink alcohol or use drugs.  Allergies  Allergen Reactions  . Codeine Other (See Comments)    Unknown reaction  . Penicillins Other (See Comments)    Unknown reaction    Family History  Problem Relation Age of Onset  . Cancer Mother   . Hypertension Father    Family history: Family history reviewed and not pertinent  Prior to Admission medications   Medication Sig Start Date End Date Taking? Authorizing Provider  acetaminophen (TYLENOL) 650 MG CR tablet Take 650 mg by mouth every 8 (eight) hours as needed for pain.     [provider]  ezetimibe-simvastatin (VYTORIN) 10-40 MG per tablet Take 1 tablet by mouth daily.     [provider]  furosemide (LASIX) 20 MG tablet Take 20 mg by mouth daily.    [provider]  HYDROcodone-acetaminophen (NORCO/VICODIN) 5-325 MG tablet 1 at hs or q6h prn pain. Take this medication with food. 09/11/16   Lily Kocher, PA-C  lisinopril (PRINIVIL,ZESTRIL) 10 MG tablet Take 10 mg by mouth daily.      [provider]  pioglitazone (ACTOS) 15 MG tablet Take 15 mg by mouth at bedtime.  05/19/16   [provider]  sertraline (ZOLOFT) 25 MG tablet Take 25 mg by mouth daily. 03/27/15   [provider]  TOUJEO SOLOSTAR 300 UNIT/ML SOPN Inject 8-20 Units into the skin 2 (two) times daily. 20 units in the morning and 8 units at bedtime 05/21/16   [provider]    Physical Exam: Vitals:   08/19/18 1030 08/19/18 1045 08/19/18 1100 08/19/18 1145  BP: 104/89 (!) 189/134 (!) 179/166 (!) 103/48  Pulse: (!) 111     Resp: 17     Temp:      TempSrc:      SpO2: 98%     Weight:      Height:        Constitutional: NAD, calm, comfortable Eyes: PERRL, lids and conjunctivae normal ENMT: Mucous membranes  are moist. Posterior pharynx clear of any exudate or lesions.Normal dentition.  Neck: normal, supple, no masses, no thyromegaly Respiratory: clear to auscultation bilaterally, no wheezing, no crackles. Normal respiratory effort. No accessory muscle use.  Cardiovascular: S1 S2 mildly tachycardic, no murmurs / rubs / gallops. 1-2+ extremity edema bilaterally. 2+ pedal pulses. No carotid bruits.  Abdomen: no tenderness, no masses palpated. No hepatosplenomegaly. Bowel sounds positive.  Musculoskeletal: no clubbing / cyanosis. No joint deformity upper and lower extremities. Good ROM, no contractures. Normal muscle tone.  Skin: venous stasis changes in LE bilaterally Neurologic: left sided facial droop. Strength is 4/5 in left hand grip, 5/5 in remaining extremities. Speech is slurred.  Psychiatric: Normal judgment and insight. Alert and oriented x 3. Normal mood.    Labs on Admission: I have personally reviewed following labs and imaging studies  CBC: Recent Labs  Lab 08/19/18 0935 08/19/18 0946  WBC 11.1*  --   NEUTROABS 6.1  --   HGB 13.0 13.3  HCT 39.5 39.0  MCV 88.6  --   PLT 155  --    Basic Metabolic Panel: Recent Labs  Lab 08/19/18 0935 08/19/18 0946  NA 132* 133*  K 3.7 3.8  CL 98 98  CO2 24  --   GLUCOSE 190* 185*  BUN 29* 29*  CREATININE 1.63* 1.60*  CALCIUM 9.2  --    GFR: Estimated Creatinine Clearance: 28.9 mL/min (A) (by C-G formula based on SCr of 1.6 mg/dL (H)). Liver Function Tests: Recent Labs  Lab 08/19/18 0935  AST 35  ALT 17  ALKPHOS 51  BILITOT 1.1  PROT 7.2  ALBUMIN 4.2   No results for input(s): LIPASE, AMYLASE in the last 168 hours. No results for input(s): AMMONIA in the last 168 hours. Coagulation Profile: Recent Labs  Lab 08/19/18 0935  INR 1.04   Cardiac Enzymes: No results for input(s): CKTOTAL, CKMB, CKMBINDEX, TROPONINI in the last 168 hours. BNP (last 3 results) No results for input(s): PROBNP in the last 8760  hours. HbA1C: No results for input(s): HGBA1C in the last 72 hours. CBG: No results for input(s): GLUCAP in the last 168 hours. Lipid Profile: No results for input(s): CHOL, HDL, LDLCALC, TRIG, CHOLHDL, LDLDIRECT in the last 72 hours. Thyroid Function Tests: No results for input(s): TSH, T4TOTAL, FREET4, T3FREE, THYROIDAB in the last 72 hours. Anemia Panel: No results for input(s): VITAMINB12, FOLATE, FERRITIN, TIBC, IRON, RETICCTPCT in the last 72 hours. Urine analysis:    Component Value Date/Time   COLORURINE YELLOW 08/19/2018 0935   APPEARANCEUR CLOUDY (A) 08/19/2018 0935   LABSPEC 1.011 08/19/2018 0935   PHURINE 5.0 08/19/2018 0935   GLUCOSEU NEGATIVE 08/19/2018 0935  HGBUR SMALL (A) 08/19/2018 0935   BILIRUBINUR NEGATIVE 08/19/2018 0935   KETONESUR NEGATIVE 08/19/2018 0935   PROTEINUR NEGATIVE 08/19/2018 0935   UROBILINOGEN 0.2 04/29/2011 1847   NITRITE NEGATIVE 08/19/2018 0935   LEUKOCYTESUR LARGE (A) 08/19/2018 0935    Radiological Exams on Admission: Ct Head Code Stroke Wo Contrast  Result Date: 08/19/2018 CLINICAL DATA:  Code stroke. 84 year old female with left side weakness. Last seen well 2130 hours yesterday. EXAM: CT HEAD WITHOUT CONTRAST TECHNIQUE: Contiguous axial images were obtained from the base of the skull through the vertex without intravenous contrast. COMPARISON:  Head CT 01/01/2016 and earlier. FINDINGS: Brain: Confluent cytotoxic edema encompassing an area of 8 centimeters in the right hemisphere corresponding to the posterior right MCA territory. Posterior right insula and temporal lobe involvement. There is some superimposed chronic encephalomalacia underlying the acute ischemia as seen in 2017. There is no associated hemorrhage or significant mass effect. Superimposed much smaller area of cortical and subcortical white matter hypodensity in the contralateral posterior left MCA territory best seen on series 2, image 16. This is new since 2017. Chronic but  increased hypodensity in the bilateral corona radiata and left thalamus since 2017. Chronic cerebellar infarcts appear stable and greater in the right hemisphere. Stable brainstem. No midline shift, ventriculomegaly. Vascular: Calcified atherosclerosis at the skull base. No suspicious intracranial vascular hyperdensity. Skull: No acute osseous abnormality identified. Hyperostosis of the calvarium, normal variant. Sinuses/Orbits: Chronic sphenoid mucoperiosteal thickening. Mild left sphenoid mucosal thickening and bubbly opacity is chronic but increased. Tympanic cavities and mastoids remain clear. Other: No acute orbit or scalp soft tissue findings. ASPECTS [facility] Stroke Program Early CT Score) - Ganglionic level infarction (caudate, lentiform nuclei, internal capsule, insula, M1-M3 cortex): 4 - Supraganglionic infarction (M4-M6 cortex): 1 Total score (0-10 with 10 being normal): 5 (in the right hemisphere). IMPRESSION: 1. Acute on chronic posterior Right MCA territory infarct. No hemorrhage or mass effect. ASPECTS 5. 2. Superimposed age indeterminate much smaller posterior Left MCA territory ischemia (series 2, image 16), new since 2017. 3. Underlying chronic small vessel disease and chronic bilateral cerebellar infarcts. 4. Study discussed by telephone with Dr. Julianne Rice on 08/19/2018 at 10:06 . Electronically Signed   By: Genevie Ann M.D.   On: 08/19/2018 10:07    EKG: Independently reviewed.  Junctional tachycardia  Assessment/Plan Active Problems:   Acute lower UTI   CVA (cerebral vascular accident) ([place])   Left-sided weakness   HTN (hypertension)   HLD (hyperlipidemia)   CKD (chronic kidney disease) stage 3, GFR 30-59 ml/min (HCC)   Type 2 diabetes mellitus with hyperlipidemia ([place])     1. Acute CVA with left sided weakness and slurring of speech. CT head does show evidence of right MCA distribution infarct and question of left MCA ischemia. She will need further work up with MRI/MRA, echo  and carotid dopplers. Check lipid panel and a1c. She reports taking aspirin in the past when she had her last stroke several years ago,b ut stopped taking aspirin when she had issues with bleeding when giving herself an insulin shot. Holding lisinopril and lasix to allow permissive hypertension. PT/OT and SLP eval requested. 2. UTI. Urinalysis indicates possible UTI. Urine culture in process. On rocephin 3. CKD 3. Last creatinine was from 2017. Unclear baseline. Continue to monitor with hydration. Hold lisinopril 4. HLD. Continue statin and zetia 5. HTN. Holding lisinopril and lasix to allow for permissive hypertension 6. Diabetes. Holding pioglitazone and toujeo for now. Once she passes swallow screen, can  restart basal insulin when diet is resumed. Used SSI for now  DVT prophylaxis: lovenox Code Status: full code  Family Communication: discussed with daughter at the bedside  Disposition Plan: transfer to [facility] for neurology evaluation  Consults called:   Admission status: inpatient, telemetry   Kathie Dike MD [facility] Pager [phone]  If 7PM-7AM, please contact night-coverage [email or web address] Password [email or web address]  08/19/2018, 11:56 AM

## 2018-08-19 NOTE — ED Notes (Signed)
CT called, to CT at this time

## 2018-08-19 NOTE — ED Triage Notes (Signed)
Family reports pt fell x 2 yesterday.  Pt says she thinks she just tripped over her feet.  LKW was last night around 9pm.  Daughter woke pt up this morning and noticed her speech was different, left sided facial droop and left sided weakness.  Pt denies any headache or dizziness.  Family says pt seems a little disoriented.

## 2018-08-19 NOTE — Plan of Care (Signed)
Patient stable, discussed stroke POC with patient, agreeable with plan, denies question/concerns at this time.

## 2018-08-19 NOTE — ED Notes (Signed)
Vital signs and neuro checks are now q2

## 2018-08-19 NOTE — Consult Note (Signed)
[facility] TeleNeurology Consult Services    Date of Service:   08/19/2018 10:06:00  Impression:      .  RO Acute Ischemic Stroke     .  Right Hemispheric     .  MCA Distribution  Comments: Permissive HTN and IV fluids. ASA for now, stroke work up  Metrics: Last Known Well: 08/18/2018 21:00:00 [facility] Notification Time: 08/19/2018 10:05:16 Arrival Time: 08/19/2018 09:20:00 Stamp Time: 08/19/2018 10:06:00 Time First Login Attempt: 08/19/2018 10:13:00 Video Start Time: 08/19/2018 10:13:00  Symptoms: Left side weakness NIHSS Start Assessment Time: 08/19/2018 10:15:00 Patient is not a candidate for tPA. Patient was not deemed candidate for tPA thrombolytics because of Last Well Known Above 4.5 Hours. Video End Time: 08/19/2018 10:31:32  CT head was reviewed. IMPRESSION: 1. Acute on chronic posterior Right MCA territory infarct. No hemorrhage or mass effect. ASPECTS 5. 2. Superimposed age indeterminate much smaller posterior Left MCA territory ischemia (series 2, image 16), new since 2017. 3. Underlying chronic small vessel disease and chronic bilateral cerebellar infarcts.  CTA not done as elevated Cr. Stroke already subacute in CT, not NIR candidate   Our recommendations are outlined below.  Recommendations:     .  Activate Stroke Protocol Admission/Order Set     .  Stroke/Telemetry Floor     .  Neuro Checks     .  Bedside Swallow Eval     .  DVT Prophylaxis     .  IV Fluids, Normal Saline     .  Head of Bed Below 30 Degrees     .  Euglycemia and Avoid Hyperthermia (PRN Acetaminophen)     .  Antiplatelet Therapy Recommended  Routine Consultation with [facility] for Follow up Care  Sign Out:     .  Discussed with Emergency Department Provider    ------------------------------------------------------------------------------  History of Present Illness:  Patient is a 84 year old Female.  Patient was brought by EMS for symptoms of Left  side weakness  Patient was last seen normal yesterday at 2100 and today was found with left facial droop and left weakness. Initially in ED reported left neglect but when i saw her she was not. But did have left facial droop and dysmetria. She had a previous RMCA stroke but no residual deficit.  CT head was reviewed.    Examination:  1A: Level of Consciousness - Alert; keenly responsive + 0 1B: Ask Month and Age - Both Questions Right + 0 1C: Blink Eyes & Squeeze Hands - Performs Both Tasks + 0 2: Test Horizontal Extraocular Movements - Normal + 0 3: Test Visual Fields - Partial Hemianopia + 1 4: Test Facial Palsy (Use Grimace if Obtunded) - Minor paralysis (flat nasolabial fold, smile asymetry) + 1 5A: Test Left Arm Motor Drift - No Drift for 10 Seconds + 0 5B: Test Right Arm Motor Drift - No Drift for 10 Seconds + 0 6A: Test Left Leg Motor Drift - No Drift for 5 Seconds + 0 6B: Test Right Leg Motor Drift - No Drift for 5 Seconds + 0 7: Test Limb Ataxia (FNF/Heel-Shin) - Ataxia in 1 Limb + 1 8: Test Sensation - Normal; No sensory loss + 0 9: Test Language/Aphasia - Normal; No aphasia + 0 10: Test Dysarthria - Normal + 0 11: Test Extinction/Inattention - No abnormality + 0  NIHSS Score: 3  Patient was informed the Neurology Consult would happen via TeleHealth consult by way of interactive audio and video telecommunications  and consented to receiving care in this manner.  Due to the immediate potential for life-threatening deterioration due to underlying acute neurologic illness, I spent 35 minutes providing critical care. This time includes time for face to face visit via telemedicine, review of medical records, imaging studies and discussion of findings with providers, the patient and/or family.   Dr Lemar Livings   [facility] [phone]

## 2018-08-19 NOTE — ED Notes (Signed)
EDP notified. 

## 2018-08-19 NOTE — Progress Notes (Signed)
CODE STROKE 0939 CALL TIME 0940 BEEPER TIME 0944 EXAM STARTED 0947 EXAM FINISHED 0949 EXAM COMPLETED IN EPIC [place]

## 2018-08-20 ENCOUNTER — Inpatient Hospital Stay (HOSPITAL_COMMUNITY): Payer: Medicare Other

## 2018-08-20 DIAGNOSIS — I639 Cerebral infarction, unspecified: Secondary | ICD-10-CM

## 2018-08-20 DIAGNOSIS — R531 Weakness: Secondary | ICD-10-CM

## 2018-08-20 DIAGNOSIS — I4719 Other supraventricular tachycardia: Secondary | ICD-10-CM

## 2018-08-20 DIAGNOSIS — I471 Supraventricular tachycardia: Secondary | ICD-10-CM

## 2018-08-20 DIAGNOSIS — I63511 Cerebral infarction due to unspecified occlusion or stenosis of right middle cerebral artery: Secondary | ICD-10-CM

## 2018-08-20 LAB — GLUCOSE, CAPILLARY
GLUCOSE-CAPILLARY: 140 mg/dL — AB (ref 70–99)
GLUCOSE-CAPILLARY: 171 mg/dL — AB (ref 70–99)
GLUCOSE-CAPILLARY: 171 mg/dL — AB (ref 70–99)
Glucose-Capillary: 123 mg/dL — ABNORMAL HIGH (ref 70–99)
Glucose-Capillary: 160 mg/dL — ABNORMAL HIGH (ref 70–99)

## 2018-08-20 LAB — LIPID PANEL
CHOL/HDL RATIO: 2.6 ratio
Cholesterol: 94 mg/dL (ref 0–200)
HDL: 36 mg/dL — ABNORMAL LOW (ref >40)
LDL Cholesterol: 42 mg/dL (ref 0–99)
Triglycerides: 82 mg/dL (ref <150)
VLDL: 16 mg/dL (ref 0–40)

## 2018-08-20 LAB — HEMOGLOBIN A1C
HEMOGLOBIN A1C: 11.6 % — AB (ref 4.8–5.6)
Mean Plasma Glucose: 286.22 mg/dL

## 2018-08-20 MED ORDER — SODIUM CHLORIDE 0.9 % IV SOLN
INTRAVENOUS | Status: DC
  Administered 2018-08-20: 19:00:00 via INTRAVENOUS

## 2018-08-20 NOTE — Progress Notes (Signed)
PROGRESS NOTE  ROSENDA GEFFRARD OZD:[id number] DOB: October 16, 1933 DOA: 08/19/2018 PCP: Celene Squibb, MD  Brief Narrative: 84 year old woman presented with slurred speech, left-sided weakness, left-sided facial droop.  CT showed large right MCA distribution infarct.  Not a candidate for TPA since site of window  Assessment/Plan Large acute right MCA ischemic infarct.  LDL 42.  EKG shows apparent junctional tachycardia, inferior infarct, prolonged QT.  Compared to previous study 05/21/2016 inferior infarct is old, prolonged QT is new.  Was in sinus rhythm on that study. --Aspirin, continue statin.  Neurology plans TEE and loop recorder. --Repeat EKG to assess rhythm.  Narrow complex tachycardia, appears to be junctional on initial EKG.  Repeat EKG pending.  I do not appreciate any flutter waves.  Given her stroke, neurology request for TEE and loop recorder implantation, will obtain cardiology consultation for further evaluation and recommendations. --Completely asymptomatic.  Repeat EKG.  Heart rate appears to be borderline 110-120s.  Hyperlipidemia --Continue statin  DM type 2.  Hemoglobin A1c 11.6. --CBG stable.  Hold oral agent for now.  CKD stage III --Creatinine appears stable.     DVT prophylaxis: enoxaparin Code Status: Full Family Communication: son and daughter at bedside Disposition Plan: CIR vs SNF?    [name], MD  [facility] Direct contact: [phone] --Via amion app OR  --[email or web address]; password [email or web address]  7PM-7AM contact night coverage as above 08/20/2018, 3:41 PM  LOS: 1 day   Consultants:  Neurology   Procedures:  Echo Study Conclusions  - Left ventricle: The cavity size was normal. Wall thickness was   increased in a pattern of moderate LVH. Systolic function was   normal. The estimated ejection fraction was in the range of 50%   to 55%. Wall motion was normal; there were no regional wall   motion abnormalities. Doppler parameters are  consistent with   abnormal left ventricular relaxation (grade 1 diastolic   dysfunction). Doppler parameters are consistent with high   ventricular filling pressure. - Aortic valve: There was mild regurgitation. Valve area (VTI):   2.27 cm^2. Valve area (Vmax): 2.01 cm^2. Valve area (Vmean): 2.14   cm^2. - Atrial septum: No defect or patent foramen ovale was identified.  Antimicrobials:    Interval history/Subjective: Feels okay, no pain, no neurologic complaints.  Objective: Vitals:  Vitals:   08/20/18 1242 08/20/18 1243  BP: 111/73   Pulse:  (!) 120  Resp: 16   Temp:  98.4 F (36.9 C)  SpO2: 96%     Exam:  Constitutional:  . Appears calm and comfortable Eyes:  . pupils and irises appear normal ENMT:  . grossly normal hearing  Respiratory:  . CTA bilaterally, no w/r/r.  . Respiratory effort normal.  Cardiovascular:  . RRR, no m/r/g . No LE extremity edema   Musculoskeletal:  . RUE, LUE, RLE, LLE   . Moves all extremities to command Neurologic:  . Face appears symmetric Psychiatric:  . Mental status o Mood, affect appropriate  I have personally reviewed the following:   Data: . CBG appears stable . Creatinine stable 1.60 . Hemoglobin A1c 11.6  Scheduled Meds: . aspirin  300 mg Rectal Daily   Or  . aspirin  325 mg Oral Daily  . enoxaparin (LOVENOX) injection  40 mg Subcutaneous Q24H  . ezetimibe  10 mg Oral Daily  . insulin aspart  0-15 Units Subcutaneous TID WC  . insulin aspart  0-5 Units Subcutaneous QHS  . sertraline  25 mg Oral Daily  .  simvastatin  40 mg Oral q1800   Continuous Infusions: . sodium chloride 75 mL/hr at 08/20/18 1150  . cefTRIAXone (ROCEPHIN)  IV 1 g (08/20/18 1331)    Principal Problem:   Acute ischemic cerebrovascular accident (CVA) involving right middle cerebral artery territory [facility]) Active Problems:   Acute lower UTI   CVA (cerebral vascular accident) ([place])   Left-sided weakness   HTN (hypertension)   HLD  (hyperlipidemia)   CKD (chronic kidney disease) stage 3, GFR 30-59 ml/min (HCC)   Type 2 diabetes mellitus with hyperlipidemia (HCC)   Narrow complex tachycardia ([place])   LOS: 1 day

## 2018-08-20 NOTE — Progress Notes (Signed)
Inpatient Rehabilitation Admissions Coordinator  I will follow up Monday to discuss rehab venue goals and expectations. I will begin insurance authorization with [facility] Medicare once PT eval complete.  Danne Baxter, RN, MSN Rehab Admissions Coordinator [phone] 08/20/2018 3:25 PM

## 2018-08-20 NOTE — Consult Note (Addendum)
Reason for Consult: Tachycardia Referring Physician: [facility]  Rebecca Hardy is an 84 y.o. female.  HPI:   84 y/o caucasian female with hypertension, hyperlipidemia, DM, CKD stage III, prior CVA, h/o DCIS of left breast, now admitted with left sided weakness and found to have subacute right MCA infarct. Cardioembolic source is suspected and patient is referred to [facility] for TEE and loop recorder placement. However, I was requested by hospitalist Dr. Sarajane Jews to see the patient for acute consult for episodes of tachycardia. Patient is asymptomatic without any palpitations, chest pain or shortness of breath.  At baseline, patient lives alone and performs most ADL's by herself. She does not drive mush anymore.   Past Medical History:  Diagnosis Date  . 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 Diabetes mellitus   . GERD (gastroesophageal reflux disease)   . High cholesterol   . History of TIA (transient ischemic attack)   . Hypertension   . Stroke [facility])     Past Surgical History:  Procedure Laterality Date  . ABDOMINAL HYSTERECTOMY    . BREAST LUMPECTOMY    . CESAREAN SECTION      Family History  Problem Relation Age of Onset  . Cancer Mother   . Hypertension Father     Social History:  reports that she has never smoked. She has never used smokeless tobacco. She reports that she does not drink alcohol or use drugs.  Allergies:  Allergies  Allergen Reactions  . Codeine Other (See Comments)    Unknown reaction  . Penicillins Other (See Comments)    Unknown reaction  Has patient had a PCN reaction causing immediate rash, facial/tongue/throat swelling, SOB or lightheadedness with hypotension: Uknown Has patient had a PCN reaction causing severe rash involving mucus membranes or skin necrosis: Uknown Has patient had a PCN reaction that required hospitalization: Unknown Has patient had a PCN reaction occurring within the last 10 years:  No If all of the above answers are "NO", then may proceed with Cephalosporin use.    Medications: I have reviewed the patient's current medications.  Results for orders placed or performed during the hospital encounter of 08/19/18 (from the past 48 hour(s))  Ethanol     Status: None   Collection Time: 08/19/18  9:35 AM  Result Value Ref Range   Alcohol, Ethyl (B) <10 <10 mg/dL    Comment: (NOTE) Lowest detectable limit for serum alcohol is 10 mg/dL. For medical purposes only. Performed at [facility], [address], [place], [place] [address]   Protime-INR     Status: None   Collection Time: 08/19/18  9:35 AM  Result Value Ref Range   Prothrombin Time 13.5 11.4 - 15.2 seconds   INR 1.04     Comment: Performed at [facility], [address], [place], [place] [address]  APTT     Status: None   Collection Time: 08/19/18  9:35 AM  Result Value Ref Range   aPTT 25 24 - 36 seconds    Comment: Performed at [facility], [address] [place], [place] [address]  CBC     Status: Abnormal   Collection Time: 08/19/18  9:35 AM  Result Value Ref Range   WBC 11.1 (H) 4.0 - 10.5 K/uL   RBC 4.46 3.87 - 5.11 MIL/uL   Hemoglobin 13.0 12.0 - 15.0 g/dL   HCT 39.5 36.0 - 46.0 %   MCV 88.6 80.0 - 100.0 fL  MCH 29.1 26.0 - 34.0 pg   MCHC 32.9 30.0 - 36.0 g/dL   RDW 12.3 11.5 - 15.5 %   Platelets 155 150 - 400 K/uL   nRBC 0.0 0.0 - 0.2 %    Comment: Performed at [facility], [address] [place], [place] [address]  Differential     Status: Abnormal   Collection Time: 08/19/18  9:35 AM  Result Value Ref Range   Neutrophils Relative % 55 %   Neutro Abs 6.1 1.7 - 7.7 K/uL   Lymphocytes Relative 33 %   Lymphs Abs 3.6 0.7 - 4.0 K/uL   Monocytes Relative 10 %   Monocytes Absolute 1.1 (H) 0.1 - 1.0 K/uL   Eosinophils Relative 1 %   Eosinophils Absolute 0.2 0.0 - 0.5 K/uL   Basophils Relative 1 %   Basophils Absolute 0.1 0.0 - 0.1 K/uL   Immature Granulocytes 0 %   Abs Immature  Granulocytes 0.02 0.00 - 0.07 K/uL    Comment: Performed at [facility], [address] [place], [place] [address]  Comprehensive metabolic panel     Status: Abnormal   Collection Time: 08/19/18  9:35 AM  Result Value Ref Range   Sodium 132 (L) 135 - 145 mmol/L   Potassium 3.7 3.5 - 5.1 mmol/L   Chloride 98 98 - 111 mmol/L   CO2 24 22 - 32 mmol/L   Glucose, Bld 190 (H) 70 - 99 mg/dL   BUN 29 (H) 8 - 23 mg/dL   Creatinine, Ser 1.63 (H) 0.44 - 1.00 mg/dL   Calcium 9.2 8.9 - 10.3 mg/dL   Total Protein 7.2 6.5 - 8.1 g/dL   Albumin 4.2 3.5 - 5.0 g/dL   AST 35 15 - 41 U/L   ALT 17 0 - 44 U/L   Alkaline Phosphatase 51 38 - 126 U/L   Total Bilirubin 1.1 0.3 - 1.2 mg/dL   GFR calc non Af Amer 28 (L) >60 mL/min   GFR calc Af Amer 33 (L) >60 mL/min    Comment: (NOTE) The eGFR has been calculated using the CKD EPI equation. This calculation has not been validated in all clinical situations. eGFR's persistently <60 mL/min signify possible Chronic Kidney Disease.    Anion gap 10 5 - 15    Comment: Performed at [facility], [address] [place], [place] [address]  Urine rapid drug screen (hosp performed)     Status: None   Collection Time: 08/19/18  9:35 AM  Result Value Ref Range   Opiates NONE DETECTED NONE DETECTED   Cocaine NONE DETECTED NONE DETECTED   Benzodiazepines NONE DETECTED NONE DETECTED   Amphetamines NONE DETECTED NONE DETECTED   Tetrahydrocannabinol NONE DETECTED NONE DETECTED   Barbiturates NONE DETECTED NONE DETECTED    Comment: (NOTE) DRUG SCREEN FOR MEDICAL PURPOSES ONLY.  IF CONFIRMATION IS NEEDED FOR ANY PURPOSE, NOTIFY LAB WITHIN 5 DAYS. LOWEST DETECTABLE LIMITS FOR URINE DRUG SCREEN Drug Class                     Cutoff (ng/mL) Amphetamine and metabolites    1000 Barbiturate and metabolites    200 Benzodiazepine                 449 Tricyclics and metabolites     300 Opiates and metabolites        300 Cocaine and metabolites        300 THC  50 Performed at [facility], [address] [place], [place] [address]   Urinalysis, Routine w reflex microscopic     Status: Abnormal   Collection Time: 08/19/18  9:35 AM  Result Value Ref Range   Color, Urine YELLOW YELLOW   APPearance CLOUDY (A) CLEAR   Specific Gravity, Urine 1.011 1.005 - 1.030   pH 5.0 5.0 - 8.0   Glucose, UA NEGATIVE NEGATIVE mg/dL   Hgb urine dipstick SMALL (A) NEGATIVE   Bilirubin Urine NEGATIVE NEGATIVE   Ketones, ur NEGATIVE NEGATIVE mg/dL   Protein, ur NEGATIVE NEGATIVE mg/dL   Nitrite NEGATIVE NEGATIVE   Leukocytes, UA LARGE (A) NEGATIVE   RBC / HPF 0-5 0 - 5 RBC/hpf   WBC, UA >50 (H) 0 - 5 WBC/hpf   Bacteria, UA MANY (A) NONE SEEN   Squamous Epithelial / LPF 0-5 0 - 5   WBC Clumps PRESENT     Comment: Performed at [facility], [address] [place], [place] [address]  Urine culture     Status: Abnormal (Preliminary result)   Collection Time: 08/19/18  9:35 AM  Result Value Ref Range   Specimen Description      URINE, CLEAN CATCH Performed at [facility], [address] [place], [place] [address]    Special Requests      NONE Performed at [facility], [address], [place], [place] [address]    Culture >=100,000 COLONIES/mL GRAM NEGATIVE RODS (A)    Report Status PENDING   I-stat troponin, ED     Status: None   Collection Time: 08/19/18  9:44 AM  Result Value Ref Range   Troponin i, poc 0.02 0.00 - 0.08 ng/mL   Comment 3            Comment: Due to the release kinetics of cTnI, a negative result within the first hours of the onset of symptoms does not rule out myocardial infarction with certainty. If myocardial infarction is still suspected, repeat the test at appropriate intervals.   I-Stat Chem 8, ED     Status: Abnormal   Collection Time: 08/19/18  9:46 AM  Result Value Ref Range   Sodium 133 (L) 135 - 145 mmol/L   Potassium 3.8 3.5 - 5.1 mmol/L   Chloride 98 98 - 111 mmol/L   BUN 29 (H) 8 - 23 mg/dL    Creatinine, Ser 1.60 (H) 0.44 - 1.00 mg/dL   Glucose, Bld 185 (H) 70 - 99 mg/dL   Calcium, Ion 1.15 1.15 - 1.40 mmol/L   TCO2 22 22 - 32 mmol/L   Hemoglobin 13.3 12.0 - 15.0 g/dL   HCT 39.0 36.0 - 46.0 %  CBG monitoring, ED     Status: Abnormal   Collection Time: 08/19/18  4:12 PM  Result Value Ref Range   Glucose-Capillary 199 (H) 70 - 99 mg/dL  Glucose, capillary     Status: Abnormal   Collection Time: 08/19/18 11:08 PM  Result Value Ref Range   Glucose-Capillary 171 (H) 70 - 99 mg/dL   Comment 1 Notify RN    Comment 2 Document in Chart   Glucose, capillary     Status: Abnormal   Collection Time: 08/20/18  4:12 AM  Result Value Ref Range   Glucose-Capillary 123 (H) 70 - 99 mg/dL   Comment 1 Notify RN    Comment 2 Document in Chart   Glucose, capillary     Status: Abnormal   Collection Time: 08/20/18  7:46 AM  Result Value Ref Range   Glucose-Capillary 140 (H) 70 - 99 mg/dL   Comment 1 Notify RN    Comment 2 Document in Chart   Lipid panel     Status: Abnormal   Collection Time: 08/20/18  9:55 AM  Result Value Ref Range   Cholesterol 94 0 - 200 mg/dL   Triglycerides 82 <150 mg/dL   HDL 36 (L) >40 mg/dL   Total CHOL/HDL Ratio 2.6 RATIO   VLDL 16 0 - 40 mg/dL   LDL Cholesterol 42 0 - 99 mg/dL    Comment:        Total Cholesterol/HDL:CHD Risk Coronary Heart Disease Risk Table                     Men   Women  1/2 Average Risk   3.4   3.3  Average Risk       5.0   4.4  2 X Average Risk   9.6   7.1  3 X Average Risk  23.4   11.0        Use the calculated Patient Ratio above and the CHD Risk Table to determine the patient's CHD Risk.        ATP III CLASSIFICATION (LDL):  <100     mg/dL   Optimal  100-129  mg/dL   Near or Above                    Optimal  130-159  mg/dL   Borderline  160-189  mg/dL   High  >190     mg/dL   Very High Performed at [facility] [address], [place], [place] [address]   Hemoglobin A1c     Status: Abnormal   Collection Time:  08/20/18  9:55 AM  Result Value Ref Range   Hgb A1c MFr Bld 11.6 (H) 4.8 - 5.6 %    Comment: (NOTE) Pre diabetes:          5.7%-6.4% Diabetes:              >6.4% Glycemic control for   <7.0% adults with diabetes    Mean Plasma Glucose 286.22 mg/dL    Comment: Performed at [facility] [address], [place], [place] [address]  Glucose, capillary     Status: Abnormal   Collection Time: 08/20/18 11:32 AM  Result Value Ref Range   Glucose-Capillary 171 (H) 70 - 99 mg/dL   Comment 1 QC Due    Comment 2 Notify RN    Comment 3 Document in Chart   Glucose, capillary     Status: Abnormal   Collection Time: 08/20/18  3:56 PM  Result Value Ref Range   Glucose-Capillary 171 (H) 70 - 99 mg/dL   Comment 1 Notify RN    Comment 2 Document in Chart     Mr Brain Wo Contrast  Result Date: 08/19/2018 CLINICAL DATA:  Fall x2. This occurred yesterday. Last seen normal at 9 p.m. 08/18/2018. Slurred speech and LEFT-sided weakness are noted upon awakening today. EXAM: MRI HEAD WITHOUT CONTRAST MRA HEAD WITHOUT CONTRAST TECHNIQUE: Multiplanar, multiecho pulse sequences of the brain and surrounding structures were obtained without intravenous contrast. Angiographic images of the head were obtained using MRA technique without contrast. COMPARISON:  CT code stroke 08/19/2018.  CT head 01/01/2016. FINDINGS: Motion degraded exam. MRI HEAD FINDINGS Brain: Large area of restricted diffusion corresponding low ADC, affecting the RIGHT MCA territory as predicted  from CT, representing acute RIGHT MCA posterior division M2 thrombosis with acute infarction. This affects the frontal, temporal, parietal, and insular lobes, as well as adjacent white matter. There may be posterior limb internal capsule involvement, but the lentiform nucleus is largely spared. No hemorrhage, mass lesion, hydrocephalus, or extra-axial fluid. Generalized atrophy. Hypoattenuation of white matter, likely small vessel disease. Chronic deep  white matter lacunar infarcts most notable RIGHT frontal subcortical white matter adjacent to caudate, also chronic LEFT and RIGHT parietal cortical infarcts. BILATERAL cerebellar infarcts also noted of a chronic nature. Vascular: Reported separately Skull and upper cervical spine: Normal marrow Sinuses/Orbits: No significant opacity or layering fluid. BILATERAL cataract extraction. Other: None MRA HEAD FINDINGS Motion degraded exam.  Small or subtle lesions could be overlooked. Gross patency of the internal carotid arteries bilaterally without visible flow reducing stenosis. Diffusely narrowed LEFT A1 ACA, with dominant RIGHT anterior cerebral artery largely supplying both A2 and A3 segments. Unremarkable LEFT M1 MCA and proximal M2 segments. Proximal RIGHT M1 MCA is patent, but there is some loss of signal in its distal aspect. The anterior division of the M2 MCA is patent, but there is no posterior division flow related enhancement observed consistent with thrombosis contributing to the acute pattern of infarction. The basilar artery is widely patent. Artifactual misregistration in its midportion. Both vertebrals supply the basilar. Both PCA segments are unremarkable proximally. Cerebellar branches poorly visualized. No definite saccular aneurysm. IMPRESSION: Large, nonhemorrhagic, acute infarction involving RIGHT MCA posterior M2 division territory, with corresponding thrombosis of that vessel on MRA. No significant midline shift or brain swelling at this time. Superimposed chronic ischemia in the brain, and chronic microvascular ischemic change. Electronically Signed   By: Staci Righter M.D.   On: 08/19/2018 15:04   Mr Jodene Hardy Head/brain ZO Cm  Result Date: 08/19/2018 CLINICAL DATA:  Fall x2. This occurred yesterday. Last seen normal at 9 p.m. 08/18/2018. Slurred speech and LEFT-sided weakness are noted upon awakening today. EXAM: MRI HEAD WITHOUT CONTRAST MRA HEAD WITHOUT CONTRAST TECHNIQUE: Multiplanar,  multiecho pulse sequences of the brain and surrounding structures were obtained without intravenous contrast. Angiographic images of the head were obtained using MRA technique without contrast. COMPARISON:  CT code stroke 08/19/2018.  CT head 01/01/2016. FINDINGS: Motion degraded exam. MRI HEAD FINDINGS Brain: Large area of restricted diffusion corresponding low ADC, affecting the RIGHT MCA territory as predicted from CT, representing acute RIGHT MCA posterior division M2 thrombosis with acute infarction. This affects the frontal, temporal, parietal, and insular lobes, as well as adjacent white matter. There may be posterior limb internal capsule involvement, but the lentiform nucleus is largely spared. No hemorrhage, mass lesion, hydrocephalus, or extra-axial fluid. Generalized atrophy. Hypoattenuation of white matter, likely small vessel disease. Chronic deep white matter lacunar infarcts most notable RIGHT frontal subcortical white matter adjacent to caudate, also chronic LEFT and RIGHT parietal cortical infarcts. BILATERAL cerebellar infarcts also noted of a chronic nature. Vascular: Reported separately Skull and upper cervical spine: Normal marrow Sinuses/Orbits: No significant opacity or layering fluid. BILATERAL cataract extraction. Other: None MRA HEAD FINDINGS Motion degraded exam.  Small or subtle lesions could be overlooked. Gross patency of the internal carotid arteries bilaterally without visible flow reducing stenosis. Diffusely narrowed LEFT A1 ACA, with dominant RIGHT anterior cerebral artery largely supplying both A2 and A3 segments. Unremarkable LEFT M1 MCA and proximal M2 segments. Proximal RIGHT M1 MCA is patent, but there is some loss of signal in its distal aspect. The anterior division of the M2  MCA is patent, but there is no posterior division flow related enhancement observed consistent with thrombosis contributing to the acute pattern of infarction. The basilar artery is widely patent.  Artifactual misregistration in its midportion. Both vertebrals supply the basilar. Both PCA segments are unremarkable proximally. Cerebellar branches poorly visualized. No definite saccular aneurysm. IMPRESSION: Large, nonhemorrhagic, acute infarction involving RIGHT MCA posterior M2 division territory, with corresponding thrombosis of that vessel on MRA. No significant midline shift or brain swelling at this time. Superimposed chronic ischemia in the brain, and chronic microvascular ischemic change. Electronically Signed   By: Staci Righter M.D.   On: 08/19/2018 15:04   Ct Head Code Stroke Wo Contrast  Result Date: 08/19/2018 CLINICAL DATA:  Code stroke. 84 year old female with left side weakness. Last seen well 2130 hours yesterday. EXAM: CT HEAD WITHOUT CONTRAST TECHNIQUE: Contiguous axial images were obtained from the base of the skull through the vertex without intravenous contrast. COMPARISON:  Head CT 01/01/2016 and earlier. FINDINGS: Brain: Confluent cytotoxic edema encompassing an area of 8 centimeters in the right hemisphere corresponding to the posterior right MCA territory. Posterior right insula and temporal lobe involvement. There is some superimposed chronic encephalomalacia underlying the acute ischemia as seen in 2017. There is no associated hemorrhage or significant mass effect. Superimposed much smaller area of cortical and subcortical white matter hypodensity in the contralateral posterior left MCA territory best seen on series 2, image 16. This is new since 2017. Chronic but increased hypodensity in the bilateral corona radiata and left thalamus since 2017. Chronic cerebellar infarcts appear stable and greater in the right hemisphere. Stable brainstem. No midline shift, ventriculomegaly. Vascular: Calcified atherosclerosis at the skull base. No suspicious intracranial vascular hyperdensity. Skull: No acute osseous abnormality identified. Hyperostosis of the calvarium, normal variant.  Sinuses/Orbits: Chronic sphenoid mucoperiosteal thickening. Mild left sphenoid mucosal thickening and bubbly opacity is chronic but increased. Tympanic cavities and mastoids remain clear. Other: No acute orbit or scalp soft tissue findings. ASPECTS [facility] Stroke Program Early CT Score) - Ganglionic level infarction (caudate, lentiform nuclei, internal capsule, insula, M1-M3 cortex): 4 - Supraganglionic infarction (M4-M6 cortex): 1 Total score (0-10 with 10 being normal): 5 (in the right hemisphere). IMPRESSION: 1. Acute on chronic posterior Right MCA territory infarct. No hemorrhage or mass effect. ASPECTS 5. 2. Superimposed age indeterminate much smaller posterior Left MCA territory ischemia (series 2, image 16), new since 2017. 3. Underlying chronic small vessel disease and chronic bilateral cerebellar infarcts. 4. Study discussed by telephone with Dr. Julianne Rice on 08/19/2018 at 10:06 . Electronically Signed   By: Genevie Ann M.D.   On: 08/19/2018 10:07   Vas US Carotid  Result Date: 08/20/2018 Carotid Arterial Duplex Study Indications: CVA. Performing Technologist: Abram Sander  Examination Guidelines: A complete evaluation includes B-mode imaging, spectral Doppler, color Doppler, and power Doppler as needed of all accessible portions of each vessel. Bilateral testing is considered an integral part of a complete examination. Limited examinations for reoccurring indications may be performed as noted.  Right Carotid Findings: +----------+--------+--------+--------+-----------+--------+           PSV cm/sEDV cm/sStenosisDescribe   Comments +----------+--------+--------+--------+-----------+--------+ CCA Prox  76      6               homogeneous         +----------+--------+--------+--------+-----------+--------+ CCA Distal58      11              homogeneous         +----------+--------+--------+--------+-----------+--------+  ICA Prox  101     14      1-39%   homogeneous          +----------+--------+--------+--------+-----------+--------+ ICA Distal63      11                                  +----------+--------+--------+--------+-----------+--------+ ECA       117                                         +----------+--------+--------+--------+-----------+--------+ +----------+--------+-------+--------+-------------------+           PSV cm/sEDV cmsDescribeArm Pressure (mmHG) +----------+--------+-------+--------+-------------------+ Subclavian89                                         +----------+--------+-------+--------+-------------------+ +---------+--------+--+--------+-+---------+ VertebralPSV cm/s48EDV cm/s9Antegrade +---------+--------+--+--------+-+---------+  Left Carotid Findings: +----------+--------+--------+--------+-----------+--------+           PSV cm/sEDV cm/sStenosisDescribe   Comments +----------+--------+--------+--------+-----------+--------+ CCA Prox  80      8               homogeneous         +----------+--------+--------+--------+-----------+--------+ CCA Distal89      14              homogeneous         +----------+--------+--------+--------+-----------+--------+ ICA Prox  85      23      1-39%   homogeneous         +----------+--------+--------+--------+-----------+--------+ ICA Distal66      21                                  +----------+--------+--------+--------+-----------+--------+ ECA       115                                         +----------+--------+--------+--------+-----------+--------+ +----------+--------+--------+--------+-------------------+ SubclavianPSV cm/sEDV cm/sDescribeArm Pressure (mmHG) +----------+--------+--------+--------+-------------------+           119                                         +----------+--------+--------+--------+-------------------+ +---------+--------+--+--------+--+---------+ VertebralPSV cm/s43EDV cm/s12Antegrade  +---------+--------+--+--------+--+---------+  Summary: Right Carotid: Velocities in the right ICA are consistent with a 1-39% stenosis. Left Carotid: Velocities in the left ICA are consistent with a 1-39% stenosis. Vertebrals:  Bilateral vertebral arteries demonstrate antegrade flow. Subclavians: Normal flow hemodynamics were seen in bilateral subclavian              arteries. *See table(s) above for measurements and observations.  Electronically signed by Antony Contras MD on 08/20/2018 at 2:03:49 PM.    Final    Cardiac studies: EKG 08/20/2018 1547: Short RP tachycardia 120 bpm. Normal axis. Normal conduction. Compared to baseline EKG in 2017, retrograde P waves seen in leads II, III are new. Unfortunately, telemetry monitor does not reveal adequate strips to show onset and offset of this rhythm.  Review of Systems  Constitutional: Negative for malaise/fatigue.  HENT: Negative.   Eyes:  Negative.   Respiratory: Negative for shortness of breath.   Cardiovascular: Negative for chest pain and palpitations.  Gastrointestinal: Negative for abdominal pain and nausea.  Genitourinary: Negative.   Musculoskeletal: Negative.   Skin:       Bilateral legs with dry, scaly skin   Neurological: Positive for weakness (Mild left sided weakness).  Endo/Heme/Allergies: Does not bruise/bleed easily.  Psychiatric/Behavioral: Negative.   All other systems reviewed and are negative.  Blood pressure 111/73, pulse (!) 120, temperature 98.4 F (36.9 C), temperature source Oral, resp. rate 16, height '5\' 6"'  (1.676 m), weight 82.6 kg, SpO2 96 %. Physical Exam  Constitutional: She is oriented to person, place, and time. She appears well-developed and well-nourished. No distress.  Neck: No JVD present.  Cardiovascular: Normal rate, regular rhythm and normal heart sounds.  No murmur heard. Pulses:      Dorsalis pedis pulses are 1+ on the right side, and 1+ on the left side.       Posterior tibial pulses are 0 on  the right side, and 0 on the left side.  Respiratory: Effort normal and breath sounds normal.  GI: Soft. Bowel sounds are normal. There is no tenderness. There is no rebound.  Musculoskeletal: She exhibits edema (Trace edema b/l LE).  Lymphadenopathy:    She has no cervical adenopathy.  Neurological: She is alert and oriented to person, place, and time.  Mild left UE weakness  Skin: Skin is warm and dry.  Psychiatric: She has a normal mood and affect.    Assessment/Recommendations:  84 y/o caucasian female with hypertension, hyperlipidemia, DM, CKD stage III, prior CVA, h/o DCIS of left breast, now with Rt MCA CVA and episodes of tachycardia:  Tachycardia: Intermittent episodes of narrow complex short RP tachycardia. No clear flutter waves seen. Most likely asymptomatic AVNRT episodes with no hemodynamic instability. Given her CVA, need to look for any Afib/flutter. She is now back in sinus rhythm with distinct P waves. Repeat EKG. She may still benefit from loop recorder to rule put Afib/futter. For acute episodes of such tachycardia not relieved with vagal maneuvers (other than carotid massage), could use IV metoprolol 2.5 mg as needed X 2. If episodes occur frequently, could add metoprolol tartarate 12.5 mg bid.   Rest of the management per the primary team. TEE and loop recorder by [facility] has been requested by Neurology team.  Reynold Bowen Jaquawn Saffran 08/20/2018, 5:12 PM   Baila Rouse Esther Hardy, MD [facility] [place] Pager: [phone] Office: [phone] If no answer Cell [phone]

## 2018-08-20 NOTE — Progress Notes (Signed)
Rehab Admissions Coordinator Note:  Patient was screened by Cleatrice Burke for appropriateness for an Inpatient Acute Rehab Consult per OT recommendation.   At this time, we are recommending Inpatient Rehab consult.  Cleatrice Burke 08/20/2018, 12:56 PM  I can be reached at [phone].

## 2018-08-20 NOTE — Progress Notes (Signed)
*  Preliminary Results* Carotid artery duplex has been completed. Bilateral internal carotid arteries are 1-39%. Vertebral arteries are patent with antegrade flow.  08/20/2018 11:11 AM  Rebecca Hardy

## 2018-08-20 NOTE — Progress Notes (Signed)
PT Cancellation Note  Patient Details Name: Rebecca Hardy MRN: [id number] DOB: 11-04-1932   Cancelled Treatment:    Reason Eval/Treat Not Completed: Patient at procedure or test/unavailable. Pt off of the floor to vascular. PT will continue to follow acutely as available.    [place] 08/20/2018, 11:00 AM

## 2018-08-20 NOTE — Consult Note (Signed)
Physical Medicine and Rehabilitation Consult Reason for Consult: Left-sided weakness and slurred speech Referring Physician: [facility]   HPI: Rebecca Hardy is a 84 y.o. right-handed female with history of hypertension, diabetes mellitus, prior CVA, CKD stage III.  Presented 08/19/2018 with slurred speech and left-sided weakness.  Per chart review patient lives alone.  Independent with assistive device prior to admission.  Two-level home with bedroom on Main and 4 steps to entry.  Family in the area plan to assist as needed.  Cranial CT scan showed acute on chronic posterior right MCA territory infarction.  No hemorrhage or mass-effect.  MRI showed large nonhemorrhagic acute infarction right MCA posterior M2 division territory.  No significant midline shift.  Echocardiogram with ejection fraction of 09% grade 1 diastolic dysfunction no regional wall motion abnormalities.  Carotid Dopplers with no ICA stenosis.  Patient did not receive TPA.  Currently maintained on aspirin for CVA prophylaxis.  Subcutaneous Lovenox for DVT prophylaxis.  Tolerating a regular diet.  Therapy evaluation completed with recommendations of physical medicine rehab consult.   Review of Systems  Constitutional: Negative for chills and fever.  HENT: Negative for hearing loss.   Eyes: Negative for blurred vision and double vision.  Respiratory: Negative for cough and shortness of breath.   Cardiovascular: Negative for chest pain and leg swelling.  Gastrointestinal: Positive for constipation. Negative for nausea and vomiting.       GERD  Genitourinary: Negative for dysuria, flank pain and hematuria.  Musculoskeletal: Positive for myalgias.  Skin: Negative for rash.  Neurological: Positive for speech change and focal weakness.  All other systems reviewed and are negative.  Past Medical History:  Diagnosis Date  . 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 Diabetes mellitus   . GERD  (gastroesophageal reflux disease)   . High cholesterol   . History of TIA (transient ischemic attack)   . Hypertension   . Stroke [facility])    Past Surgical History:  Procedure Laterality Date  . ABDOMINAL HYSTERECTOMY    . BREAST LUMPECTOMY    . CESAREAN SECTION     Family History  Problem Relation Age of Onset  . Cancer Mother   . Hypertension Father    Social History:  reports that she has never smoked. She has never used smokeless tobacco. She reports that she does not drink alcohol or use drugs. Allergies:  Allergies  Allergen Reactions  . Codeine Other (See Comments)    Unknown reaction  . Penicillins Other (See Comments)    Unknown reaction  Has patient had a PCN reaction causing immediate rash, facial/tongue/throat swelling, SOB or lightheadedness with hypotension: Uknown Has patient had a PCN reaction causing severe rash involving mucus membranes or skin necrosis: Uknown Has patient had a PCN reaction that required hospitalization: Unknown Has patient had a PCN reaction occurring within the last 10 years: No If all of the above answers are "NO", then may proceed with Cephalosporin use.   Medications Prior to Admission  Medication Sig Dispense Refill  . ezetimibe (ZETIA) 10 MG tablet Take 10 mg by mouth daily.   0  . furosemide (LASIX) 20 MG tablet Take 40 mg by mouth daily.     Marland Kitchen GLIPIZIDE XL 5 MG 24 hr tablet Take 5 mg by mouth.   1  . lisinopril (PRINIVIL,ZESTRIL) 10 MG tablet Take 20 mg by mouth daily.     . potassium chloride (K-DUR) 10 MEQ tablet Take  10 mEq by mouth once.   4  . rosuvastatin (CRESTOR) 20 MG tablet Take 20 mg by mouth daily.   1  . sertraline (ZOLOFT) 25 MG tablet Take 25 mg by mouth at bedtime.   2  . TOUJEO SOLOSTAR 300 UNIT/ML SOPN Inject 50 Units into the skin 2 (two) times daily. 20 units in the morning and 8 units at bedtime  2    Home: Home Living Family/patient expects to be discharged to:: Private residence Living Arrangements:  Alone Available Help at Discharge: Family, Available 24 hours/day, Available PRN/intermittently(family lives close by, could arrange 24 hr assist) Type of Home: House Home Access: Stairs to enter Technical brewer of Steps: 4 Home Layout: Two level, Able to live on main level with bedroom/bathroom Bathroom Toilet: Handicapped height Home Equipment: Environmental consultant - 2 wheels, Brandon - single point  Functional History: Prior Function Level of Independence: Independent with assistive device(s) Comments: Pt reports use of AD or external support of furniture when walking around house Functional Status:  Mobility: Bed Mobility Overal bed mobility: Needs Assistance Bed Mobility: Sit to Supine Sit to supine: Min guard, HOB elevated General bed mobility comments: Pt sitting EOB with daughter upon OT arrival. Min guard to return to bed at end of session but no physical assist Transfers Overall transfer level: Needs assistance Equipment used: Rolling walker (2 wheeled) Transfers: Sit to/from Stand Sit to Stand: Min assist, Mod assist General transfer comment: min - mod A to power up to standing. cues for technique with rw.      ADL: ADL Overall ADL's : Needs assistance/impaired Eating/Feeding: Set up, Sitting, Cueing for compensatory techinques Grooming: Sitting, Minimal assistance, Standing, Moderate assistance Upper Body Bathing: Moderate assistance, Sitting Lower Body Bathing: Moderate assistance, Sit to/from stand Upper Body Dressing : Moderate assistance, Sitting Lower Body Dressing: Moderate assistance Toilet Transfer: Moderate assistance, Ambulation, Comfort height toilet, RW Toilet Transfer Details (indicate cue type and reason): min guard to min A for mobility, mod A for sit>stand Toileting- Clothing Manipulation and Hygiene: Moderate assistance, Sit to/from stand Toileting - Clothing Manipulation Details (indicate cue type and reason): did not realize paper had dropped out of hand.  Cueing that she needed to reload paper. Assist to powerup to standing Tub/ Shower Transfer: Minimal assistance, Ambulation, 3 in 1, Rolling walker Functional mobility during ADLs: Minimal assistance, Min guard, Rolling walker General ADL Comments: Pt completed toilet transfer, pericare, grooming task at sink and bed mobility as detailed above. Noted to need max cueing to locate paper towel dispenser on left side. Left rw to the side at sink then walked into it when she turned to go back to bed.    Cognition: Cognition Overall Cognitive Status: Impaired/Different from baseline Orientation Level: Oriented X4 Cognition Arousal/Alertness: Awake/alert Behavior During Therapy: (often/easily laughing) Overall Cognitive Status: Impaired/Different from baseline Area of Impairment: Orientation, Attention, Memory, Following commands, Safety/judgement, Awareness, Problem solving Orientation Level: Situation Current Attention Level: Focused Memory: Decreased recall of precautions, Decreased short-term memory Following Commands: Follows one step commands consistently Safety/Judgement: Decreased awareness of deficits, Decreased awareness of safety Awareness: Intellectual Problem Solving: Slow processing, Difficulty sequencing, Requires verbal cues General Comments: consistent with 1-step commands with environmental distractions reduced.   Blood pressure 111/73, pulse (!) 120, temperature 98.4 F (36.9 C), temperature source Oral, resp. rate 16, height 5\' 6"  (1.676 m), weight 82.6 kg, SpO2 96 %. Physical Exam  HENT:  Head: Normocephalic.  Cardiovascular: Normal rate.  Respiratory: Effort normal.  Neurological:  Patient is alert.  Mildly dysarthric.  She did need some cues and prompting for appropriate day.  She was able to give her age.  Follows simple commands. Left central 7, left hemiparesis    Results for orders placed or performed during the hospital encounter of 08/19/18 (from the past 24  hour(s))  CBG monitoring, ED     Status: Abnormal   Collection Time: 08/19/18  4:12 PM  Result Value Ref Range   Glucose-Capillary 199 (H) 70 - 99 mg/dL  Glucose, capillary     Status: Abnormal   Collection Time: 08/19/18 11:08 PM  Result Value Ref Range   Glucose-Capillary 171 (H) 70 - 99 mg/dL   Comment 1 Notify RN    Comment 2 Document in Chart   Glucose, capillary     Status: Abnormal   Collection Time: 08/20/18  4:12 AM  Result Value Ref Range   Glucose-Capillary 123 (H) 70 - 99 mg/dL   Comment 1 Notify RN    Comment 2 Document in Chart   Glucose, capillary     Status: Abnormal   Collection Time: 08/20/18  7:46 AM  Result Value Ref Range   Glucose-Capillary 140 (H) 70 - 99 mg/dL   Comment 1 Notify RN    Comment 2 Document in Chart   Lipid panel     Status: Abnormal   Collection Time: 08/20/18  9:55 AM  Result Value Ref Range   Cholesterol 94 0 - 200 mg/dL   Triglycerides 82 <150 mg/dL   HDL 36 (L) >40 mg/dL   Total CHOL/HDL Ratio 2.6 RATIO   VLDL 16 0 - 40 mg/dL   LDL Cholesterol 42 0 - 99 mg/dL  Hemoglobin A1c     Status: Abnormal   Collection Time: 08/20/18  9:55 AM  Result Value Ref Range   Hgb A1c MFr Bld 11.6 (H) 4.8 - 5.6 %   Mean Plasma Glucose 286.22 mg/dL  Glucose, capillary     Status: Abnormal   Collection Time: 08/20/18 11:32 AM  Result Value Ref Range   Glucose-Capillary 171 (H) 70 - 99 mg/dL   Comment 1 QC Due    Comment 2 Notify RN    Comment 3 Document in Chart    Mr Brain Wo Contrast  Result Date: 08/19/2018 CLINICAL DATA:  Fall x2. This occurred yesterday. Last seen normal at 9 p.m. 08/18/2018. Slurred speech and LEFT-sided weakness are noted upon awakening today. EXAM: MRI HEAD WITHOUT CONTRAST MRA HEAD WITHOUT CONTRAST TECHNIQUE: Multiplanar, multiecho pulse sequences of the brain and surrounding structures were obtained without intravenous contrast. Angiographic images of the head were obtained using MRA technique without contrast. COMPARISON:   CT code stroke 08/19/2018.  CT head 01/01/2016. FINDINGS: Motion degraded exam. MRI HEAD FINDINGS Brain: Large area of restricted diffusion corresponding low ADC, affecting the RIGHT MCA territory as predicted from CT, representing acute RIGHT MCA posterior division M2 thrombosis with acute infarction. This affects the frontal, temporal, parietal, and insular lobes, as well as adjacent white matter. There may be posterior limb internal capsule involvement, but the lentiform nucleus is largely spared. No hemorrhage, mass lesion, hydrocephalus, or extra-axial fluid. Generalized atrophy. Hypoattenuation of white matter, likely small vessel disease. Chronic deep white matter lacunar infarcts most notable RIGHT frontal subcortical white matter adjacent to caudate, also chronic LEFT and RIGHT parietal cortical infarcts. BILATERAL cerebellar infarcts also noted of a chronic nature. Vascular: Reported separately Skull and upper cervical spine: Normal marrow Sinuses/Orbits: No significant opacity or layering fluid. BILATERAL cataract extraction.  Other: None MRA HEAD FINDINGS Motion degraded exam.  Small or subtle lesions could be overlooked. Gross patency of the internal carotid arteries bilaterally without visible flow reducing stenosis. Diffusely narrowed LEFT A1 ACA, with dominant RIGHT anterior cerebral artery largely supplying both A2 and A3 segments. Unremarkable LEFT M1 MCA and proximal M2 segments. Proximal RIGHT M1 MCA is patent, but there is some loss of signal in its distal aspect. The anterior division of the M2 MCA is patent, but there is no posterior division flow related enhancement observed consistent with thrombosis contributing to the acute pattern of infarction. The basilar artery is widely patent. Artifactual misregistration in its midportion. Both vertebrals supply the basilar. Both PCA segments are unremarkable proximally. Cerebellar branches poorly visualized. No definite saccular aneurysm.  IMPRESSION: Large, nonhemorrhagic, acute infarction involving RIGHT MCA posterior M2 division territory, with corresponding thrombosis of that vessel on MRA. No significant midline shift or brain swelling at this time. Superimposed chronic ischemia in the brain, and chronic microvascular ischemic change. Electronically Signed   By: Staci Righter M.D.   On: 08/19/2018 15:04   Mr Jodene Nam Head/brain OE Cm  Result Date: 08/19/2018 CLINICAL DATA:  Fall x2. This occurred yesterday. Last seen normal at 9 p.m. 08/18/2018. Slurred speech and LEFT-sided weakness are noted upon awakening today. EXAM: MRI HEAD WITHOUT CONTRAST MRA HEAD WITHOUT CONTRAST TECHNIQUE: Multiplanar, multiecho pulse sequences of the brain and surrounding structures were obtained without intravenous contrast. Angiographic images of the head were obtained using MRA technique without contrast. COMPARISON:  CT code stroke 08/19/2018.  CT head 01/01/2016. FINDINGS: Motion degraded exam. MRI HEAD FINDINGS Brain: Large area of restricted diffusion corresponding low ADC, affecting the RIGHT MCA territory as predicted from CT, representing acute RIGHT MCA posterior division M2 thrombosis with acute infarction. This affects the frontal, temporal, parietal, and insular lobes, as well as adjacent white matter. There may be posterior limb internal capsule involvement, but the lentiform nucleus is largely spared. No hemorrhage, mass lesion, hydrocephalus, or extra-axial fluid. Generalized atrophy. Hypoattenuation of white matter, likely small vessel disease. Chronic deep white matter lacunar infarcts most notable RIGHT frontal subcortical white matter adjacent to caudate, also chronic LEFT and RIGHT parietal cortical infarcts. BILATERAL cerebellar infarcts also noted of a chronic nature. Vascular: Reported separately Skull and upper cervical spine: Normal marrow Sinuses/Orbits: No significant opacity or layering fluid. BILATERAL cataract extraction. Other: None MRA  HEAD FINDINGS Motion degraded exam.  Small or subtle lesions could be overlooked. Gross patency of the internal carotid arteries bilaterally without visible flow reducing stenosis. Diffusely narrowed LEFT A1 ACA, with dominant RIGHT anterior cerebral artery largely supplying both A2 and A3 segments. Unremarkable LEFT M1 MCA and proximal M2 segments. Proximal RIGHT M1 MCA is patent, but there is some loss of signal in its distal aspect. The anterior division of the M2 MCA is patent, but there is no posterior division flow related enhancement observed consistent with thrombosis contributing to the acute pattern of infarction. The basilar artery is widely patent. Artifactual misregistration in its midportion. Both vertebrals supply the basilar. Both PCA segments are unremarkable proximally. Cerebellar branches poorly visualized. No definite saccular aneurysm. IMPRESSION: Large, nonhemorrhagic, acute infarction involving RIGHT MCA posterior M2 division territory, with corresponding thrombosis of that vessel on MRA. No significant midline shift or brain swelling at this time. Superimposed chronic ischemia in the brain, and chronic microvascular ischemic change. Electronically Signed   By: Staci Righter M.D.   On: 08/19/2018 15:04   Ct Head Code Stroke  Wo Contrast  Result Date: 08/19/2018 CLINICAL DATA:  Code stroke. 84 year old female with left side weakness. Last seen well 2130 hours yesterday. EXAM: CT HEAD WITHOUT CONTRAST TECHNIQUE: Contiguous axial images were obtained from the base of the skull through the vertex without intravenous contrast. COMPARISON:  Head CT 01/01/2016 and earlier. FINDINGS: Brain: Confluent cytotoxic edema encompassing an area of 8 centimeters in the right hemisphere corresponding to the posterior right MCA territory. Posterior right insula and temporal lobe involvement. There is some superimposed chronic encephalomalacia underlying the acute ischemia as seen in 2017. There is no  associated hemorrhage or significant mass effect. Superimposed much smaller area of cortical and subcortical white matter hypodensity in the contralateral posterior left MCA territory best seen on series 2, image 16. This is new since 2017. Chronic but increased hypodensity in the bilateral corona radiata and left thalamus since 2017. Chronic cerebellar infarcts appear stable and greater in the right hemisphere. Stable brainstem. No midline shift, ventriculomegaly. Vascular: Calcified atherosclerosis at the skull base. No suspicious intracranial vascular hyperdensity. Skull: No acute osseous abnormality identified. Hyperostosis of the calvarium, normal variant. Sinuses/Orbits: Chronic sphenoid mucoperiosteal thickening. Mild left sphenoid mucosal thickening and bubbly opacity is chronic but increased. Tympanic cavities and mastoids remain clear. Other: No acute orbit or scalp soft tissue findings. ASPECTS [facility] Stroke Program Early CT Score) - Ganglionic level infarction (caudate, lentiform nuclei, internal capsule, insula, M1-M3 cortex): 4 - Supraganglionic infarction (M4-M6 cortex): 1 Total score (0-10 with 10 being normal): 5 (in the right hemisphere). IMPRESSION: 1. Acute on chronic posterior Right MCA territory infarct. No hemorrhage or mass effect. ASPECTS 5. 2. Superimposed age indeterminate much smaller posterior Left MCA territory ischemia (series 2, image 16), new since 2017. 3. Underlying chronic small vessel disease and chronic bilateral cerebellar infarcts. 4. Study discussed by telephone with Dr. Julianne Rice on 08/19/2018 at 10:06 . Electronically Signed   By: Genevie Ann M.D.   On: 08/19/2018 10:07     Assessment/Plan: Diagnosis: right MCA infarct 1. Does the need for close, 24 hr/day medical supervision in concert with the patient's rehab needs make it unreasonable for this patient to be served in a less intensive setting? Yes 2. Co-Morbidities requiring supervision/potential complications:  htn, dm, prior CVA, CKD III 3. Due to bladder management, bowel management, safety, skin/wound care, disease management, medication administration, pain management and patient education, does the patient require 24 hr/day rehab nursing? Yes 4. Does the patient require coordinated care of a physician, rehab nurse, PT (1-2 hrs/day, 5 days/week), OT (1-2 hrs/day, 5 days/week) and SLP (1-2 hrs/day, 5 days/week) to address physical and functional deficits in the context of the above medical diagnosis(es)? Yes Addressing deficits in the following areas: balance, endurance, locomotion, strength, transferring, bowel/bladder control, bathing, dressing, feeding, grooming, toileting, cognition, speech and psychosocial support 5. Can the patient actively participate in an intensive therapy program of at least 3 hrs of therapy per day at least 5 days per week? Yes 6. The potential for patient to make measurable gains while on inpatient rehab is excellent 7. Anticipated functional outcomes upon discharge from inpatient rehab are supervision  with PT, supervision with OT, modified independent with SLP. 8. Estimated rehab length of stay to reach the above functional goals is: 17-23 days 9. Anticipated D/C setting: Home 10. Anticipated post D/C treatments: [place] therapy 11. Overall Rehab/Functional Prognosis: excellent  RECOMMENDATIONS: This patient's condition is appropriate for continued rehabilitative care in the following setting: CIR Patient has agreed to participate in recommended  program. Yes Note that insurance prior authorization may be required for reimbursement for recommended care.  Comment: Rehab Admissions Coordinator to follow up.  Thanks,  Meredith Staggers, MD, Mellody Drown  I have personally performed a face to face diagnostic evaluation of this patient. Additionally, I have reviewed and concur with the physician assistant's documentation above.    Lavon Paganini Angiulli, PA-C 08/20/2018

## 2018-08-20 NOTE — Progress Notes (Addendum)
Inpatient Diabetes Program Recommendations  AACE/ADA: New Consensus Statement on Inpatient Glycemic Control (2015)  Target Ranges:  Prepandial:   less than 140 mg/dL      Peak postprandial:   less than 180 mg/dL (1-2 hours)      Critically ill patients:  140 - 180 mg/dL   Lab Results  Component Value Date   GLUCAP 171 (H) 08/20/2018   HGBA1C 11.6 (H) 08/20/2018    Review of Glycemic Control Results for Rebecca Hardy, Rebecca Hardy (MRN [id number]) as of 08/20/2018 15:28  Ref. Range 08/19/2018 23:08 08/20/2018 04:12 08/20/2018 07:46 08/20/2018 11:32  Glucose-Capillary Latest Ref Range: 70 - 99 mg/dL 171 (H) 123 (H) 140 (H) 171 (H)   Diabetes history: Type 2 DM Outpatient Diabetes medications: Toujeo 50 units QAM, 8 units QPM, Glipizide 5 mg QD Current orders for Inpatient glycemic control: Novolog 0-15 units TID, Novolog 0-5 units QHS  Inpatient Diabetes Program Recommendations:    Spoke with patient and family regarding outpatient diabetes management. Verified home dosing, however, patient sometimes misses the PM dose of Toujeo. Patient's daughter reports that patient "eats what she wants".   Reviewed patient's current A1c of 11.6%. Explained what a A1c is and what it measures. Also reviewed goal A1c with patient, importance of good glucose control @ home, and blood sugar goals. Reviewed basic patho of DM, need for insulin, vascular changes and risk of additional CVAs and other comorbidites.  Patient checks blood glucose once per day- FSBS and they range in the 200's. Encouraged patient to begin performing a 2 hr post prandial and taking information with her to her next PCP appointment. Daughter plans to schedule and able to help remind patient of injections. We discussed being mindful of excessive carbohydrate intake. Patient requesting additional information on diet. Will place dietitian consult. Patient and family have no further questions at this time.   Thanks, Bronson Curb, MSN,  RNC-OB Diabetes Coordinator [phone] (8a-5p)

## 2018-08-20 NOTE — Evaluation (Signed)
Occupational Therapy Evaluation Patient Details Name: Rebecca Hardy MRN: [id number] DOB: 16-Sep-1933 Today's Date: 08/20/2018    History of Present Illness 84 y.o. female who presented to the [facility] ED on Thursday morning for evaluation of slurred speech and left sided weakness. Her PMHx includes HTN, DM, HLD, CKD3 and prior CVA. tPA was not given due to time criteria. CT head revealed Acute on chronic posterior Right MCA territory infarct. Superimposed age indeterminate much smaller posterior Left MCA territory ischemia. U/A showed possible infection.    Clinical Impression   Pt admitted with the above diagnoses and presents with below problem list. Pt will benefit from continued acute OT to address the below listed deficits and maximize independence with basic ADLs prior to d/c to venue below. PTA pt was mod I with ADLs and lives alone. Pt presents with L side weakness, LUE impaired sensation, proprioception and both gross and fine motor coordination, impaired balance, impaired vision vs left side neglect (formal vision test unsuccessful this session due to cognition). Pt was oriented to all but situation (had a fall, went to [facility]; did not mention stroke or stroke symptoms). Pt also noted to have decreased insight into deficits. Pt's daughter reports some STM deficits at baseline and that pt was tested for Alzheimer's but ultimately not diagnosed with it. Pt speech noted to have hard pauses and some delayed word finding, asymmetrical smile also noted. Pt is currently mod A with UB/LB ADLs, toilet and shower transfers; min A with functional mobility. Daughter present at end of session and seems very open to rehab.     Follow Up Recommendations  CIR    Equipment Recommendations  Other (comment)(defer to next venue)    Recommendations for Other Services PT consult;Speech consult     Precautions / Restrictions Precautions Precautions: Fall Restrictions Weight Bearing Restrictions:  No Other Position/Activity Restrictions: L side neglect      Mobility Bed Mobility Overal bed mobility: Needs Assistance Bed Mobility: Sit to Supine       Sit to supine: Min guard;HOB elevated   General bed mobility comments: Pt sitting EOB with daughter upon OT arrival. Min guard to return to bed at end of session but no physical assist  Transfers Overall transfer level: Needs assistance Equipment used: Rolling walker (2 wheeled) Transfers: Sit to/from Stand Sit to Stand: Min assist;Mod assist         General transfer comment: min - mod A to power up to standing. cues for technique with rw.    Balance Overall balance assessment: Needs assistance Sitting-balance support: No upper extremity supported;Feet supported Sitting balance-Leahy Scale: Fair Sitting balance - Comments: cues for full EOB positioning with pt initially sitting turned to the right    Standing balance support: Bilateral upper extremity supported;Single extremity supported;During functional activity Standing balance-Leahy Scale: Poor Standing balance comment: reaches for external support of rw or sink/furniture                           ADL either performed or assessed with clinical judgement   ADL Overall ADL's : Needs assistance/impaired Eating/Feeding: Set up;Sitting;Cueing for compensatory techinques   Grooming: Sitting;Minimal assistance;Standing;Moderate assistance   Upper Body Bathing: Moderate assistance;Sitting   Lower Body Bathing: Moderate assistance;Sit to/from stand   Upper Body Dressing : Moderate assistance;Sitting   Lower Body Dressing: Moderate assistance   Toilet Transfer: Moderate assistance;Ambulation;Comfort height toilet;RW [employer] forces technical [employer] Details (indicate cue type and reason): min guard to  min A for mobility, mod A for sit>stand Toileting- Clothing Manipulation and Hygiene: Moderate assistance;Sit to/from stand Toileting - Clothing Manipulation Details  (indicate cue type and reason): did not realize paper had dropped out of hand. Cueing that she needed to reload paper. Assist to powerup to standing Tub/ Shower Transfer: Minimal assistance;Ambulation;3 in 1;Rolling walker   Functional mobility during ADLs: Minimal assistance;Min guard;Rolling walker General ADL Comments: Pt completed toilet transfer, pericare, grooming task at sink and bed mobility as detailed above. Noted to need max cueing to locate paper towel dispenser on left side. Left rw to the side at sink then walked into it when she turned to go back to bed.       Vision Baseline Vision/History: Wears glasses Wears Glasses: At all times Patient Visual Report: No change from baseline Vision Assessment?: Vision impaired- to be further tested in functional context Additional Comments: formal visual assessement largely unsuccessful due to impaired cognition. Decreased awareness/neglect of her left visual field and left side of her body (running into item on her left side, max cues to locate paper dispenser on left side at sink)     Perception     Praxis      Pertinent Vitals/Pain Pain Assessment: Faces Faces Pain Scale: No hurt Pain Intervention(s): Monitored during session     Hand Dominance     Extremity/Trunk Assessment Upper Extremity Assessment Upper Extremity Assessment: LUE deficits/detail LUE Deficits / Details: 3/5 gross strength. Impaired gross and [place]. Dysmetria noted. Appears to have decreased sensation (not realizing she had dropped paper 2x) but states intact. Some left side neglect noted. LUE Sensation: decreased light touch;decreased proprioception LUE Coordination: decreased fine motor;decreased gross motor   Lower Extremity Assessment Lower Extremity Assessment: Defer to PT evaluation       Communication Communication Communication: Other (comment);Expressive difficulties(noted some pauses and delays getting her words out)   Cognition  Arousal/Alertness: Awake/alert Behavior During Therapy: (often/easily laughing) Overall Cognitive Status: Impaired/Different from baseline Area of Impairment: Orientation;Attention;Memory;Following commands;Safety/judgement;Awareness;Problem solving                 Orientation Level: Situation Current Attention Level: Focused Memory: Decreased recall of precautions;Decreased short-term memory Following Commands: Follows one step commands consistently Safety/Judgement: Decreased awareness of deficits;Decreased awareness of safety Awareness: Intellectual Problem Solving: Slow processing;Difficulty sequencing;Requires verbal cues General Comments: consistent with 1-step commands with environmental distractions reduced.    General Comments  Daughter present at end of session.     Exercises Exercises: Other exercises Other Exercises Other Exercises: Pt leaving for vascular at end of session. spoke with daughter about leaving a handout in the room with some LUE exercises to work on this weekend.    Shoulder Instructions      Home Living Family/patient expects to be discharged to:: Private residence Living Arrangements: Alone Available Help at Discharge: Family;Available 24 hours/day;Available PRN/intermittently(family lives close by, could arrange 24 hr assist) Type of Home: House Home Access: Stairs to enter [employer] of Steps: 4   Home Layout: Two level;Able to live on main level with bedroom/bathroom         Bathroom Toilet: Handicapped height     Home Equipment: Walker - 2 wheels;Cane - single point          Prior Functioning/Environment Level of Independence: Independent with assistive device(s)        Comments: Pt reports use of AD or external support of furniture when walking around house        OT Problem List:  Decreased strength;Decreased activity tolerance;Impaired balance (sitting and/or standing);Impaired vision/perception;Decreased  coordination;Decreased cognition;Decreased safety awareness;Decreased knowledge of use of DME or AE;Decreased knowledge of precautions;Impaired sensation;Impaired tone;Impaired UE functional use      OT Treatment/Interventions: Self-care/ADL training;Therapeutic exercise;Neuromuscular education;DME and/or AE instruction;Therapeutic activities;Cognitive remediation/compensation;Visual/perceptual remediation/compensation;Patient/family education;Balance training    OT Goals(Current goals can be found in the care plan section) Acute Rehab OT Goals Patient Stated Goal: Daughter is very open to rehab. Pt wants to go home but appears open to considering rehab. OT Goal Formulation: With patient/family Time For Goal Achievement: 09/03/18 Potential to Achieve Goals: Good ADL Goals Pt Will Perform Grooming: with modified independence;sitting;standing Pt Will Perform Upper Body Bathing: with modified independence;sitting Pt Will Perform Lower Body Bathing: with modified independence;sit to/from stand;with supervision Pt Will Perform Upper Body Dressing: with modified independence;sitting Pt Will Perform Lower Body Dressing: with supervision;with modified independence;sit to/from stand Pt Will Transfer to Toilet: with supervision;ambulating Pt Will Perform Toileting - Clothing Manipulation and hygiene: with supervision;with modified independence;sit to/from stand Pt Will Perform Tub/Shower Transfer: with supervision;ambulating;3 in 1;rolling walker Pt/caregiver will Perform Home Exercise Program: Increased strength;Left upper extremity;With theraband;With Supervision;With written HEP provided  OT Frequency: Min 3X/week   Barriers to D/C:            Co-evaluation              AM-PAC PT "6 Clicks" Daily Activity     Outcome Measure Help from another person eating meals?: A Little Help from another person taking care of personal grooming?: A Lot Help from another person toileting, which  includes using toliet, bedpan, or urinal?: A Lot Help from another person bathing (including washing, rinsing, drying)?: A Lot Help from another person to put on and taking off regular upper body clothing?: A Lot Help from another person to put on and taking off regular lower body clothing?: A Lot 6 Click Score: 13   End of Session Equipment Utilized During Treatment: Gait belt;Rolling walker  Activity Tolerance: Patient tolerated treatment well Patient left: in bed;with call bell/phone within reach;with family/visitor present;Other (comment)(with transport services to go to testing)  OT Visit Diagnosis: Unsteadiness on feet (R26.81);Other abnormalities of gait and mobility (R26.89);History of falling (Z91.81);Other symptoms and signs involving cognitive function;Cognitive communication deficit (R41.841);Hemiplegia and hemiparesis Hemiplegia - Right/Left: Left                Time: 0076-2263 OT Time Calculation (min): 36 min Charges:  OT General Charges $OT Visit: 1 Visit OT Evaluation $OT Eval Moderate Complexity: 1 Mod OT Treatments $Self Care/Home Management : 8-22 mins  Tyrone Schimke, OT Acute Rehabilitation Services Pager: [phone] Office: [phone]  Hortencia Pilar 08/20/2018, 11:49 AM

## 2018-08-20 NOTE — Progress Notes (Signed)
STROKE TEAM PROGRESS NOTE  HPI: ( Dr Cheral Marker ) Rebecca Hardy is an 84 y.o. female who presented to the [facility] ED on Thursday morning for evaluation of slurred speech and left sided weakness. Her PMHx includes HTN, DM, HLD, CKD3 and prior CVA. When she was visited by her daughter on Wednesday night, it was noted that patient had had a fall and was on the ground. Was feeling "okay" after she was helped up, with no complaints of weakness or slurred speech evident at that time. Overnight, the patient had been able to walk to the bathroom without difficulty. On Thursday morning, the patient's daughter noticed that her mother had slurred speech. Daughter left for work and when patient's son later came by, he noted that he speech was still slurred and that she had left sided facial droop.  She was brought to the [facility] ED where Code Stroke was intitiated and Teleneurology consulted; tPA was not given due to time criteria. CT head showed hypodensity in the right MCA territory, suggestive of a subacute ischemic infarction. Due to elevated Cr, CTA was not performed. She was also felt not to be a VIR candidate by the Teleneurologist. U/A showed possible infection.the head showed an acute on chronic right MCA infarct with aspects score of 5   INTERVAL HISTORY Her RN is at the bedside.  No family present. She came to the hospital because she fell. She has a L field cut which likely led to the fall. Also with some neglect.   Vitals:   08/19/18 2200 08/20/18 0000 08/20/18 0357 08/20/18 0747  BP: (!) 92/59 (!) 139/58 (!) 131/58 115/63  Pulse:  77 (!) 116 (!) 123  Resp:  17 12 15   Temp:  98.4 F (36.9 C) 98 F (36.7 C) 98 F (36.7 C)  TempSrc:  Oral Oral Oral  SpO2:   98% 97%  Weight:      Height:        CBC:  Recent Labs  Lab 08/19/18 0935 08/19/18 0946  WBC 11.1*  --   NEUTROABS 6.1  --   HGB 13.0 13.3  HCT 39.5 39.0  MCV 88.6  --   PLT 155  --     Basic Metabolic Panel:  Recent  Labs  Lab 08/19/18 0935 08/19/18 0946  NA 132* 133*  K 3.7 3.8  CL 98 98  CO2 24  --   GLUCOSE 190* 185*  BUN 29* 29*  CREATININE 1.63* 1.60*  CALCIUM 9.2  --    Lipid Panel:     Component Value Date/Time   CHOL 94 08/20/2018 0955   TRIG 82 08/20/2018 0955   HDL 36 (L) 08/20/2018 0955   CHOLHDL 2.6 08/20/2018 0955   VLDL 16 08/20/2018 0955   LDLCALC 42 08/20/2018 0955   HgbA1c:  Lab Results  Component Value Date   HGBA1C 11.6 (H) 08/20/2018   Urine Drug Screen:     Component Value Date/Time   LABOPIA NONE DETECTED 08/19/2018 0935   COCAINSCRNUR NONE DETECTED 08/19/2018 0935   LABBENZ NONE DETECTED 08/19/2018 0935   AMPHETMU NONE DETECTED 08/19/2018 0935   THCU NONE DETECTED 08/19/2018 0935   LABBARB NONE DETECTED 08/19/2018 0935    Alcohol Level     Component Value Date/Time   ETH <10 08/19/2018 0935    IMAGING Mr Brain Wo Contrast  Result Date: 08/19/2018 CLINICAL DATA:  Fall x2. This occurred yesterday. Last seen normal at 9 p.m. 08/18/2018. Slurred speech and LEFT-sided  weakness are noted upon awakening today. EXAM: MRI HEAD WITHOUT CONTRAST MRA HEAD WITHOUT CONTRAST TECHNIQUE: Multiplanar, multiecho pulse sequences of the brain and surrounding structures were obtained without intravenous contrast. Angiographic images of the head were obtained using MRA technique without contrast. COMPARISON:  CT code stroke 08/19/2018.  CT head 01/01/2016. FINDINGS: Motion degraded exam. MRI HEAD FINDINGS Brain: Large area of restricted diffusion corresponding low ADC, affecting the RIGHT MCA territory as predicted from CT, representing acute RIGHT MCA posterior division M2 thrombosis with acute infarction. This affects the frontal, temporal, parietal, and insular lobes, as well as adjacent white matter. There may be posterior limb internal capsule involvement, but the lentiform nucleus is largely spared. No hemorrhage, mass lesion, hydrocephalus, or extra-axial fluid. Generalized  atrophy. Hypoattenuation of white matter, likely small vessel disease. Chronic deep white matter lacunar infarcts most notable RIGHT frontal subcortical white matter adjacent to caudate, also chronic LEFT and RIGHT parietal cortical infarcts. BILATERAL cerebellar infarcts also noted of a chronic nature. Vascular: Reported separately Skull and upper cervical spine: Normal marrow Sinuses/Orbits: No significant opacity or layering fluid. BILATERAL cataract extraction. Other: None MRA HEAD FINDINGS Motion degraded exam.  Small or subtle lesions could be overlooked. Gross patency of the internal carotid arteries bilaterally without visible flow reducing stenosis. Diffusely narrowed LEFT A1 ACA, with dominant RIGHT anterior cerebral artery largely supplying both A2 and A3 segments. Unremarkable LEFT M1 MCA and proximal M2 segments. Proximal RIGHT M1 MCA is patent, but there is some loss of signal in its distal aspect. The anterior division of the M2 MCA is patent, but there is no posterior division flow related enhancement observed consistent with thrombosis contributing to the acute pattern of infarction. The basilar artery is widely patent. Artifactual misregistration in its midportion. Both vertebrals supply the basilar. Both PCA segments are unremarkable proximally. Cerebellar branches poorly visualized. No definite saccular aneurysm. IMPRESSION: Large, nonhemorrhagic, acute infarction involving RIGHT MCA posterior M2 division territory, with corresponding thrombosis of that vessel on MRA. No significant midline shift or brain swelling at this time. Superimposed chronic ischemia in the brain, and chronic microvascular ischemic change. Electronically Signed   By: Staci Righter M.D.   On: 08/19/2018 15:04   Mr Jodene Nam Head/brain LZ Cm  Result Date: 08/19/2018 CLINICAL DATA:  Fall x2. This occurred yesterday. Last seen normal at 9 p.m. 08/18/2018. Slurred speech and LEFT-sided weakness are noted upon awakening today.  EXAM: MRI HEAD WITHOUT CONTRAST MRA HEAD WITHOUT CONTRAST TECHNIQUE: Multiplanar, multiecho pulse sequences of the brain and surrounding structures were obtained without intravenous contrast. Angiographic images of the head were obtained using MRA technique without contrast. COMPARISON:  CT code stroke 08/19/2018.  CT head 01/01/2016. FINDINGS: Motion degraded exam. MRI HEAD FINDINGS Brain: Large area of restricted diffusion corresponding low ADC, affecting the RIGHT MCA territory as predicted from CT, representing acute RIGHT MCA posterior division M2 thrombosis with acute infarction. This affects the frontal, temporal, parietal, and insular lobes, as well as adjacent white matter. There may be posterior limb internal capsule involvement, but the lentiform nucleus is largely spared. No hemorrhage, mass lesion, hydrocephalus, or extra-axial fluid. Generalized atrophy. Hypoattenuation of white matter, likely small vessel disease. Chronic deep white matter lacunar infarcts most notable RIGHT frontal subcortical white matter adjacent to caudate, also chronic LEFT and RIGHT parietal cortical infarcts. BILATERAL cerebellar infarcts also noted of a chronic nature. Vascular: Reported separately Skull and upper cervical spine: Normal marrow Sinuses/Orbits: No significant opacity or layering fluid. BILATERAL cataract extraction. Other: None  MRA HEAD FINDINGS Motion degraded exam.  Small or subtle lesions could be overlooked. Gross patency of the internal carotid arteries bilaterally without visible flow reducing stenosis. Diffusely narrowed LEFT A1 ACA, with dominant RIGHT anterior cerebral artery largely supplying both A2 and A3 segments. Unremarkable LEFT M1 MCA and proximal M2 segments. Proximal RIGHT M1 MCA is patent, but there is some loss of signal in its distal aspect. The anterior division of the M2 MCA is patent, but there is no posterior division flow related enhancement observed consistent with thrombosis  contributing to the acute pattern of infarction. The basilar artery is widely patent. Artifactual misregistration in its midportion. Both vertebrals supply the basilar. Both PCA segments are unremarkable proximally. Cerebellar branches poorly visualized. No definite saccular aneurysm. IMPRESSION: Large, nonhemorrhagic, acute infarction involving RIGHT MCA posterior M2 division territory, with corresponding thrombosis of that vessel on MRA. No significant midline shift or brain swelling at this time. Superimposed chronic ischemia in the brain, and chronic microvascular ischemic change. Electronically Signed   By: Staci Righter M.D.   On: 08/19/2018 15:04   Ct Head Code Stroke Wo Contrast  Result Date: 08/19/2018 CLINICAL DATA:  Code stroke. 84 year old female with left side weakness. Last seen well 2130 hours yesterday. EXAM: CT HEAD WITHOUT CONTRAST TECHNIQUE: Contiguous axial images were obtained from the base of the skull through the vertex without intravenous contrast. COMPARISON:  Head CT 01/01/2016 and earlier. FINDINGS: Brain: Confluent cytotoxic edema encompassing an area of 8 centimeters in the right hemisphere corresponding to the posterior right MCA territory. Posterior right insula and temporal lobe involvement. There is some superimposed chronic encephalomalacia underlying the acute ischemia as seen in 2017. There is no associated hemorrhage or significant mass effect. Superimposed much smaller area of cortical and subcortical white matter hypodensity in the contralateral posterior left MCA territory best seen on series 2, image 16. This is new since 2017. Chronic but increased hypodensity in the bilateral corona radiata and left thalamus since 2017. Chronic cerebellar infarcts appear stable and greater in the right hemisphere. Stable brainstem. No midline shift, ventriculomegaly. Vascular: Calcified atherosclerosis at the skull base. No suspicious intracranial vascular hyperdensity. Skull: No acute  osseous abnormality identified. Hyperostosis of the calvarium, normal variant. Sinuses/Orbits: Chronic sphenoid mucoperiosteal thickening. Mild left sphenoid mucosal thickening and bubbly opacity is chronic but increased. Tympanic cavities and mastoids remain clear. Other: No acute orbit or scalp soft tissue findings. ASPECTS [facility] Stroke Program Early CT Score) - Ganglionic level infarction (caudate, lentiform nuclei, internal capsule, insula, M1-M3 cortex): 4 - Supraganglionic infarction (M4-M6 cortex): 1 Total score (0-10 with 10 being normal): 5 (in the right hemisphere). IMPRESSION: 1. Acute on chronic posterior Right MCA territory infarct. No hemorrhage or mass effect. ASPECTS 5. 2. Superimposed age indeterminate much smaller posterior Left MCA territory ischemia (series 2, image 16), new since 2017. 3. Underlying chronic small vessel disease and chronic bilateral cerebellar infarcts. 4. Study discussed by telephone with Dr. Julianne Rice on 08/19/2018 at 10:06 . Electronically Signed   By: Genevie Ann M.D.   On: 08/19/2018 10:07   2D Echocardiogram   - Left ventricle: The cavity size was normal. Wall thickness was increased in a pattern of moderate LVH. Systolic function was normal. The estimated ejection fraction was in the range of 50% to 55%. Wall motion was normal; there were no regional wall motion abnormalities. Doppler parameters are consistent with abnormal left ventricular relaxation (grade 1 diastolic dysfunction). Doppler parameters are consistent with high ventricular filling pressure. -  Aortic valve: There was mild regurgitation. Valve area (VTI): 2.27 cm^2. Valve area (Vmax): 2.01 cm^2. Valve area (Vmean): 2.14 cm^2. - Atrial septum: No defect or patent foramen ovale was identified.  Carotid Doppler   There is 1-39% bilateral ICA stenosis. Vertebral artery flow is antegrade.    PHYSICAL EXAM Pleasant frail elderly Caucasian lady currently not in distress. . Afebrile. Head is  nontraumatic. Neck is supple without bruit.    Cardiac exam no murmur or gallop. Lungs are clear to auscultation. Distal pulses are well felt. Neurological Exam :   awake alert oriented 2 only. Diminished attention, registration and recall. Follows simple midline and one and few two-step commands. No dysarthria or aphasia. Extraocular movements are full range. Mild saccadic dysmetria to left. Dense left homonymous hemianopsia. Mild left hemi-neglect. Motor system exam reveals symmetric upper and lower distended without focal weakness. Diminished fine finger movements on the left. Orbits right over left upper extremity. Sensation appears intact but has been straining attention to the left. Gait not tested. ASSESSMENT/PLAN Rebecca Hardy is a 84 y.o. female with history of HTN, DM, HLD, CKD stage 3, prior stroke presenting to [facility] 10/31 with slurred speech and L HP. CT showed a subacute infarct so not a candidate for tPA. Transferred to [facility].   Stroke:  Large right MCA infarct embolic secondary to unknown source. Highly suspicious for AF  Code Stroke CT head acute on chronic R MCA infarct. Smaller L MCA infarct new since 2017. Old B cerebellar infarcts.  Small vessel disease.  ASPECTS 5    MRI / MRA  Large R MCA M2 territory infarct w/ corresponding thrombosis. Chronic ischemia. Small vessel disease.  Carotid Doppler  B ICA 1-39% stenosis, VAs antegrade   2D Echo  EF 50-55%. No source of embolus   TEE to look for embolic source. Arranged with [place] for Monday.  If positive for PFO (patent foramen ovale), check bilateral lower extremity venous dopplers to rule out DVT as possible source of stroke. (I have made patient NPO after midnight).  If TEE negative, a [place] electrophysiologist will consult and consider placement of an implantable loop recorder to evaluate for atrial fibrillation as etiology of stroke. This has been  explained to patient/family by Dr. Leonie Man and they are agreeable.  LDL 42  HgbA1c 11.6  Lovenox 40 mg sq daily for VTE prophylaxis  No antithrombotic prior to admission, now on aspirin 325 mg daily. Continue aspirin 325 mg daily for now  Therapy recommendations:  pending   Disposition:  pending   Hypertensive Urgency  As high as BP 189/134   BP now Stable . Permissive hypertension (OK if < 220/120) but gradually normalize in 5-7 days . Long-term BP goal normotensive  Hyperlipidemia  Home meds:  Vytorin (crestor 20 and zetia 10)  On simvastatin 40 in hospital  LDL 42, goal < 70  Ok to Continue home statin at discharge  Diabetes type II  HgbA1c 11.6, goal < 7.0  Uncontrolled  Other Stroke Risk Factors  Advanced age  Overweight, Body mass index is 29.38 kg/m., recommend weight loss, diet and exercise as appropriate   Hx stroke/TIA  No details available  Other Active Problems  Hx breast cancer  CKD stage III  Hospital day # [place], MSN, APRN, ANVP-BC, AGPCNP-BC Advanced Practice Stroke Nurse [place] for Schedule & Pager information 08/20/2018 11:54 AM  I have personally  examined this patient, reviewed notes, independently viewed imaging studies, participated in medical decision making and plan of care.ROS completed by me personally and pertinent positives fully documented  I have made any additions or clarifications directly to the above note. Agree with note above. She has presented with subacute right MCA infarct likely of embolic etiology source to be determined. Recommend aspirin for now and continue ongoing stroke workup. Check TEE and loop recorder. No family available at the bedside for discussion. Discussed with Dr. Sarajane Jews. Greater than 50% time during this 35 minute visit was spent on counseling and coordination of care about an embolic stroke and discussion with care team about plan of care.  Antony Contras,  MD Medical Director [facility] Stroke Center Pager: [phone] 08/20/2018 2:15 PM  To contact Stroke Continuity provider, please refer to [email or web address] After hours, contact General Neurology

## 2018-08-20 NOTE — Evaluation (Signed)
Speech Language Pathology Evaluation Patient Details Name: YEILYN GENT MRN: [id number] DOB: 1933/07/30 Today's Date: 08/20/2018 Time: 5462-7035 SLP Time Calculation (min) (ACUTE ONLY): 18 min  Problem List:  Patient Active Problem List   Diagnosis Date Noted  . Acute lower UTI 08/19/2018  . CVA (cerebral vascular accident) ([place]) 08/19/2018  . Left-sided weakness 08/19/2018  . HTN (hypertension) 08/19/2018  . HLD (hyperlipidemia) 08/19/2018  . CKD (chronic kidney disease) stage 3, GFR 30-59 ml/min ([facility]) 08/19/2018  . Type 2 diabetes mellitus with hyperlipidemia ([place]) 08/19/2018   Past Medical History:  Past Medical History:  Diagnosis Date  . 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 Diabetes mellitus   . GERD (gastroesophageal reflux disease)   . High cholesterol   . History of TIA (transient ischemic attack)   . Hypertension   . Stroke [facility])    Past Surgical History:  Past Surgical History:  Procedure Laterality Date  . ABDOMINAL HYSTERECTOMY    . BREAST LUMPECTOMY    . CESAREAN SECTION     HPI:  84 y.o. female who presented to the [facility] ED on Thursday morning for evaluation of slurred speech and left sided weakness. Her PMHx includes HTN, DM, HLD, CKD3 and prior CVA. tPA was not given due to time criteria. CT head revealed Acute on chronic posterior Right MCA territory infarct. Superimposed age indeterminate much smaller posterior Left MCA territory ischemia. U/A showed possible infection   Assessment / Plan / Recommendation Clinical Impression   Patient presents with significant cognitive linguistic deficits. Sustained attention is impaired, impacting abilities in all cognitive-linguistic tasks attempted. Difficult to determine visual deficits vs left inattention; pt did not name objects or numbers on the left in visuoperceptual tasks. She scored 7/30 on MOCA-Basic, indicating severe impairments (26 and above is considered WNL). She frequently  self-distracted during tasks, with usual mod cues for redirection required. Poor insight/awareness. Daughter reports slurred speech resolving; speech was intelligible, conversation fluent. Suspect at least mild baseline deficits in short term memory, however pt was living alone prior to admission, doing some simple meal preparation, with daughter providing some assistance for medications and finances. Skilled ST recommended to address cognitive deficits in order to improve safety and independence, decrease caregiver burden. Will follow acutely.    SLP Assessment  SLP Recommendation/Assessment: Patient needs continued Speech Lanaguage Pathology Services SLP Visit Diagnosis: Cognitive communication deficit (R41.841)    Follow Up Recommendations   Inpatient Rehab   Frequency and Duration min 2x/week  2 weeks      SLP Evaluation Cognition  Overall Cognitive Status: Impaired/Different from baseline Arousal/Alertness: Awake/alert Orientation Level: Oriented to person;Oriented to place;Oriented to situation;Disoriented to time Attention: Sustained Sustained Attention: Impaired Sustained Attention Impairment: Verbal basic;Functional basic Memory: Impaired Memory Impairment: Storage deficit;Decreased recall of new information;Decreased short term memory(immediate recall 2/5, delayed recall 0/5) Decreased Short Term Memory: Verbal basic;Functional basic Awareness: Impaired Awareness Impairment: Intellectual impairment;Emergent impairment;Anticipatory impairment Problem Solving: Impaired Problem Solving Impairment: Verbal basic Executive Function: Reasoning Reasoning: Impaired Reasoning Impairment: Verbal basic Safety/Judgment: Impaired Comments: decreased awareness of deficits       Comprehension  Auditory Comprehension Overall Auditory Comprehension: Appears within functional limits for tasks assessed Yes/No Questions: Within Functional Limits Commands: Within Functional Limits(simple  commands) Conversation: Simple Interfering Components: Attention Reading Comprehension Reading Status: Not tested    Expression Expression Primary Mode of Expression: Verbal Verbal Expression Overall Verbal Expression: Impaired Naming: Impairment Confrontation: (3/4) Divergent: 25-49% accurate(5 fruits in 60  seconds; attention/distractibility) Pragmatics: Impairment Impairments: Topic maintenance Interfering Components: Attention Written Expression Written Expression: Not tested   Oral / Motor  Oral Motor/Sensory Function Overall Oral Motor/Sensory Function: Within functional limits Motor Speech Overall Motor Speech: Appears within functional limits for tasks assessed Respiration: Within functional limits Phonation: Normal Resonance: Within functional limits Articulation: Within functional limitis Intelligibility: Intelligible Motor Planning: Witnin functional limits   [place], [place], [place] Pager: [phone] Office: [phone]  Aliene Altes 08/20/2018, 2:51 PM

## 2018-08-20 NOTE — Evaluation (Signed)
Physical Therapy Evaluation Patient Details Name: Rebecca Hardy MRN: [id number] DOB: 1932-11-08 Today's Date: 08/20/2018   History of Present Illness  Pt is an 84 y/o female who presented to the [facility] ED on Thursday morning for evaluation of slurred speech and left sided weakness. MRI revealed MRI showed large nonhemorrhagic acute infarction right MCA posterior M2 division territory. tPA not given. PMH including but not limited to HTN, DM, HLD, CKD3 and prior CVA.   Clinical Impression  Pt presented supine in bed with HOB elevated, awake and willing to participate in therapy session. Prior to admission, pt reported that she ambulated with use of RW and was independent with ADLs. Pt currently requires supervision for bed mobility, min A for transfers and min A to ambulate in hallway with use of RW. Pt with very poor safety awareness and inattention to L throughout. Pt would benefit from further intensive therapy services at CIR prior to returning home with family support. PT will continue to follow acutely to progress mobility as tolerated.     Follow Up Recommendations CIR    Equipment Recommendations  None recommended by PT    Recommendations for Other Services       Precautions / Restrictions Precautions Precautions: Fall Precaution Comments: L inattention Restrictions Weight Bearing Restrictions: No      Mobility  Bed Mobility Overal bed mobility: Needs Assistance Bed Mobility: Supine to Sit     Supine to sit: Supervision     General bed mobility comments: increased time and effort, supervision for safety  Transfers Overall transfer level: Needs assistance Equipment used: Rolling walker (2 wheeled) Transfers: Sit to/from Stand Sit to Stand: Min assist         General transfer comment: min A for stability with transition from EOB, cueing for technique  Ambulation/Gait Ambulation/Gait assistance: Min assist Gait Distance (Feet): 40 Feet Assistive device:  Rolling walker (2 wheeled) Gait Pattern/deviations: Step-through pattern;Decreased step length - right;Decreased step length - left;Decreased stride length;Drifts right/left;Trunk flexed Gait velocity: decreased   General Gait Details: pt with very poor safety awareness with ambulation, requiring constant min A; pt often running into objects on L side with inattention to L  Stairs            Wheelchair Mobility    Modified Rankin (Stroke Patients Only) Modified Rankin (Stroke Patients Only) Pre-Morbid Rankin Score: Moderate disability Modified Rankin: Moderately severe disability     Balance Overall balance assessment: Needs assistance Sitting-balance support: Feet supported Sitting balance-Leahy Scale: Fair     Standing balance support: Bilateral upper extremity supported Standing balance-Leahy Scale: Poor                               Pertinent Vitals/Pain Pain Assessment: No/denies pain    Home Living Family/patient expects to be discharged to:: Private residence Living Arrangements: Alone Available Help at Discharge: Family;Available 24 hours/day;Available PRN/intermittently(family lives close by, could arrange 24 hr assist) Type of Home: House Home Access: Stairs to enter   [employer] of Steps: 4 Home Layout: Two level;Able to live on main level with bedroom/bathroom Home Equipment: Gilford Rile - 2 wheels;Cane - single point      Prior Function Level of Independence: Independent with assistive device(s)         Comments: pt ambulates with RW     Hand Dominance        Extremity/Trunk Assessment   Upper Extremity Assessment Upper Extremity Assessment:  Defer to OT evaluation    Lower Extremity Assessment Lower Extremity Assessment: Generalized weakness       Communication   Communication: No difficulties  Cognition Arousal/Alertness: Awake/alert Behavior During Therapy: WFL for tasks assessed/performed Overall Cognitive  Status: Impaired/Different from baseline Area of Impairment: Orientation;Attention;Memory;Following commands;Safety/judgement;Awareness;Problem solving                 Orientation Level: Disoriented to;Situation Current Attention Level: Focused Memory: Decreased recall of precautions;Decreased short-term memory Following Commands: Follows one step commands with increased time Safety/Judgement: Decreased awareness of safety;Decreased awareness of deficits Awareness: Intellectual Problem Solving: Slow processing;Decreased initiation;Difficulty sequencing;Requires verbal cues        General Comments      Exercises     Assessment/Plan    PT Assessment Patient needs continued PT services  PT Problem List Decreased balance;Decreased mobility;Decreased strength;Decreased coordination;Decreased cognition;Decreased knowledge of use of DME;Decreased safety awareness;Decreased knowledge of precautions       PT Treatment Interventions DME instruction;Gait training;Stair training;Functional mobility training;Therapeutic activities;Therapeutic exercise;Balance training;Neuromuscular re-education;Cognitive remediation;Patient/family education    PT Goals (Current goals can be found in the Care Plan section)  Acute Rehab PT Goals Patient Stated Goal: return home PT Goal Formulation: With patient/family Time For Goal Achievement: 09/03/18 Potential to Achieve Goals: Good    Frequency Min 4X/week   Barriers to discharge        Co-evaluation               AM-PAC PT "6 Clicks" Daily Activity  Outcome Measure Difficulty turning over in bed (including adjusting bedclothes, sheets and blankets)?: A Little Difficulty moving from lying on back to sitting on the side of the bed? : A Little Difficulty sitting down on and standing up from a chair with arms (e.g., wheelchair, bedside commode, etc,.)?: Unable Help needed moving to and from a bed to chair (including a wheelchair)?: A  Little Help needed walking in hospital room?: A Little Help needed climbing 3-5 steps with a railing? : A Little 6 Click Score: 16    End of Session Equipment Utilized During Treatment: Gait belt Activity Tolerance: Patient tolerated treatment well Patient left: in chair;with call bell/phone within reach;with chair alarm set;with family/visitor present Nurse Communication: Mobility status PT Visit Diagnosis: Other abnormalities of gait and mobility (R26.89);Other symptoms and signs involving the nervous system (R29.898)    Time: 5537-4827 PT Time Calculation (min) (ACUTE ONLY): 25 min   Charges:   PT Evaluation $PT Eval Moderate Complexity: 1 Mod PT Treatments $Gait Training: 8-22 mins        Sherie Don, Alaynah, DPT  Acute Rehabilitation Services Pager [phone] Office [place] 08/20/2018, 4:20 PM

## 2018-08-20 NOTE — Progress Notes (Signed)
   [facility] has been requested to perform a transesophageal echocardiogram on [name] for stroke.  After careful review of history and examination, the risks and benefits of transesophageal echocardiogram have been explained including risks of esophageal damage, perforation (1:10,000 risk), bleeding, pharyngeal hematoma as well as other potential complications associated with conscious sedation including aspiration, arrhythmia, respiratory failure and death. Alternatives to treatment were discussed, questions were answered. Patient is willing to proceed.   Procedure is scheduled for Monday 08/23/2018 at 10:30am with Dr. Meda Coffee.  Darreld Mclean, PA-C 08/20/2018 4:28 PM

## 2018-08-20 NOTE — Care Management Note (Signed)
Case Management Note  Patient Details  Name: Rebecca Hardy MRN: [id number] Date of Birth: 04/07/1933  Subjective/Objective:     Pt admitted with CVA. She is from home alone.                Action/Plan: Recommendations are for CIR. Awaiting insurance authorization. CM following.  Expected Discharge Date:                  Expected Discharge Plan:  [place]  In-House Referral:     Discharge planning Services  CM Consult  Post Acute Care Choice:    Choice offered to:     DME Arranged:    DME Agency:     HH Arranged:    [place] Agency:     Status of Service:  In process, will continue to follow  If discussed at Long Length of Stay Meetings, dates discussed:    Additional Comments:  Pollie Friar, RN 08/20/2018, 4:26 PM

## 2018-08-21 DIAGNOSIS — I63411 Cerebral infarction due to embolism of right middle cerebral artery: Principal | ICD-10-CM

## 2018-08-21 DIAGNOSIS — I63511 Cerebral infarction due to unspecified occlusion or stenosis of right middle cerebral artery: Secondary | ICD-10-CM

## 2018-08-21 LAB — GLUCOSE, CAPILLARY
GLUCOSE-CAPILLARY: 141 mg/dL — AB (ref 70–99)
GLUCOSE-CAPILLARY: 128 mg/dL — AB (ref 70–99)
Glucose-Capillary: 133 mg/dL — ABNORMAL HIGH (ref 70–99)
Glucose-Capillary: 156 mg/dL — ABNORMAL HIGH (ref 70–99)

## 2018-08-21 MED ORDER — METOPROLOL TARTRATE 25 MG PO TABS
25.0000 mg | ORAL_TABLET | Freq: Two times a day (BID) | ORAL | Status: DC
  Administered 2018-08-21 – 2018-08-23 (×5): 25 mg via ORAL
  Filled 2018-08-21 (×5): qty 1

## 2018-08-21 MED ORDER — METOPROLOL TARTRATE 5 MG/5ML IV SOLN
2.5000 mg | Freq: Once | INTRAVENOUS | Status: AC
  Administered 2018-08-21: 2.5 mg via INTRAVENOUS
  Filled 2018-08-21: qty 5

## 2018-08-21 NOTE — Progress Notes (Addendum)
STROKE TEAM PROGRESS NOTE  INTERVAL HISTORY Her RN and daughter are at the bedside.  She has finished lunch, sitting in bed. Still has left hemianopia but left sided weakness improved well. Daughter said she is able to eat with her left hand although she is right handed but daughter said left facial droop seems slightly worse than in the morning. Pending TEE and loop Monday.   Vitals:   08/20/18 1243 08/20/18 1939 08/20/18 2354 08/21/18 0727  BP:  115/74 117/60   Pulse: (!) 120 (!) 105 76   Resp:  14 15   Temp: 98.4 F (36.9 C) 97.6 F (36.4 C) 98.6 F (37 C) 98.5 F (36.9 C)  TempSrc: Oral Oral Oral Oral  SpO2:  100% 95%   Weight:      Height:        CBC:  Recent Labs  Lab 08/19/18 0935 08/19/18 0946  WBC 11.1*  --   NEUTROABS 6.1  --   HGB 13.0 13.3  HCT 39.5 39.0  MCV 88.6  --   PLT 155  --     Basic Metabolic Panel:  Recent Labs  Lab 08/19/18 0935 08/19/18 0946  NA 132* 133*  K 3.7 3.8  CL 98 98  CO2 24  --   GLUCOSE 190* 185*  BUN 29* 29*  CREATININE 1.63* 1.60*  CALCIUM 9.2  --    Lipid Panel:     Component Value Date/Time   CHOL 94 08/20/2018 0955   TRIG 82 08/20/2018 0955   HDL 36 (L) 08/20/2018 0955   CHOLHDL 2.6 08/20/2018 0955   VLDL 16 08/20/2018 0955   LDLCALC 42 08/20/2018 0955   HgbA1c:  Lab Results  Component Value Date   HGBA1C 11.6 (H) 08/20/2018   Urine Drug Screen:     Component Value Date/Time   LABOPIA NONE DETECTED 08/19/2018 0935   COCAINSCRNUR NONE DETECTED 08/19/2018 0935   LABBENZ NONE DETECTED 08/19/2018 0935   AMPHETMU NONE DETECTED 08/19/2018 0935   THCU NONE DETECTED 08/19/2018 0935   LABBARB NONE DETECTED 08/19/2018 0935    Alcohol Level     Component Value Date/Time   ETH <10 08/19/2018 0935    IMAGING Mr Brain Wo Contrast  Result Date: 08/19/2018 CLINICAL DATA:  Fall x2. This occurred yesterday. Last seen normal at 9 p.m. 08/18/2018. Slurred speech and LEFT-sided weakness are noted upon awakening  today. EXAM: MRI HEAD WITHOUT CONTRAST MRA HEAD WITHOUT CONTRAST TECHNIQUE: Multiplanar, multiecho pulse sequences of the brain and surrounding structures were obtained without intravenous contrast. Angiographic images of the head were obtained using MRA technique without contrast. COMPARISON:  CT code stroke 08/19/2018.  CT head 01/01/2016. FINDINGS: Motion degraded exam. MRI HEAD FINDINGS Brain: Large area of restricted diffusion corresponding low ADC, affecting the RIGHT MCA territory as predicted from CT, representing acute RIGHT MCA posterior division M2 thrombosis with acute infarction. This affects the frontal, temporal, parietal, and insular lobes, as well as adjacent white matter. There may be posterior limb internal capsule involvement, but the lentiform nucleus is largely spared. No hemorrhage, mass lesion, hydrocephalus, or extra-axial fluid. Generalized atrophy. Hypoattenuation of white matter, likely small vessel disease. Chronic deep white matter lacunar infarcts most notable RIGHT frontal subcortical white matter adjacent to caudate, also chronic LEFT and RIGHT parietal cortical infarcts. BILATERAL cerebellar infarcts also noted of a chronic nature. Vascular: Reported separately Skull and upper cervical spine: Normal marrow Sinuses/Orbits: No significant opacity or layering fluid. BILATERAL cataract extraction. Other: None MRA HEAD  FINDINGS Motion degraded exam.  Small or subtle lesions could be overlooked. Gross patency of the internal carotid arteries bilaterally without visible flow reducing stenosis. Diffusely narrowed LEFT A1 ACA, with dominant RIGHT anterior cerebral artery largely supplying both A2 and A3 segments. Unremarkable LEFT M1 MCA and proximal M2 segments. Proximal RIGHT M1 MCA is patent, but there is some loss of signal in its distal aspect. The anterior division of the M2 MCA is patent, but there is no posterior division flow related enhancement observed consistent with thrombosis  contributing to the acute pattern of infarction. The basilar artery is widely patent. Artifactual misregistration in its midportion. Both vertebrals supply the basilar. Both PCA segments are unremarkable proximally. Cerebellar branches poorly visualized. No definite saccular aneurysm. IMPRESSION: Large, nonhemorrhagic, acute infarction involving RIGHT MCA posterior M2 division territory, with corresponding thrombosis of that vessel on MRA. No significant midline shift or brain swelling at this time. Superimposed chronic ischemia in the brain, and chronic microvascular ischemic change. Electronically Signed   By: Staci Righter M.D.   On: 08/19/2018 15:04   Mr Jodene Nam Head/brain ZO Cm  Result Date: 08/19/2018 CLINICAL DATA:  Fall x2. This occurred yesterday. Last seen normal at 9 p.m. 08/18/2018. Slurred speech and LEFT-sided weakness are noted upon awakening today. EXAM: MRI HEAD WITHOUT CONTRAST MRA HEAD WITHOUT CONTRAST TECHNIQUE: Multiplanar, multiecho pulse sequences of the brain and surrounding structures were obtained without intravenous contrast. Angiographic images of the head were obtained using MRA technique without contrast. COMPARISON:  CT code stroke 08/19/2018.  CT head 01/01/2016. FINDINGS: Motion degraded exam. MRI HEAD FINDINGS Brain: Large area of restricted diffusion corresponding low ADC, affecting the RIGHT MCA territory as predicted from CT, representing acute RIGHT MCA posterior division M2 thrombosis with acute infarction. This affects the frontal, temporal, parietal, and insular lobes, as well as adjacent white matter. There may be posterior limb internal capsule involvement, but the lentiform nucleus is largely spared. No hemorrhage, mass lesion, hydrocephalus, or extra-axial fluid. Generalized atrophy. Hypoattenuation of white matter, likely small vessel disease. Chronic deep white matter lacunar infarcts most notable RIGHT frontal subcortical white matter adjacent to caudate, also chronic  LEFT and RIGHT parietal cortical infarcts. BILATERAL cerebellar infarcts also noted of a chronic nature. Vascular: Reported separately Skull and upper cervical spine: Normal marrow Sinuses/Orbits: No significant opacity or layering fluid. BILATERAL cataract extraction. Other: None MRA HEAD FINDINGS Motion degraded exam.  Small or subtle lesions could be overlooked. Gross patency of the internal carotid arteries bilaterally without visible flow reducing stenosis. Diffusely narrowed LEFT A1 ACA, with dominant RIGHT anterior cerebral artery largely supplying both A2 and A3 segments. Unremarkable LEFT M1 MCA and proximal M2 segments. Proximal RIGHT M1 MCA is patent, but there is some loss of signal in its distal aspect. The anterior division of the M2 MCA is patent, but there is no posterior division flow related enhancement observed consistent with thrombosis contributing to the acute pattern of infarction. The basilar artery is widely patent. Artifactual misregistration in its midportion. Both vertebrals supply the basilar. Both PCA segments are unremarkable proximally. Cerebellar branches poorly visualized. No definite saccular aneurysm. IMPRESSION: Large, nonhemorrhagic, acute infarction involving RIGHT MCA posterior M2 division territory, with corresponding thrombosis of that vessel on MRA. No significant midline shift or brain swelling at this time. Superimposed chronic ischemia in the brain, and chronic microvascular ischemic change. Electronically Signed   By: Staci Righter M.D.   On: 08/19/2018 15:04   Ct Head Code Stroke Wo Contrast  Result  Date: 08/19/2018 CLINICAL DATA:  Code stroke. 84 year old female with left side weakness. Last seen well 2130 hours yesterday. EXAM: CT HEAD WITHOUT CONTRAST TECHNIQUE: Contiguous axial images were obtained from the base of the skull through the vertex without intravenous contrast. COMPARISON:  Head CT 01/01/2016 and earlier. FINDINGS: Brain: Confluent cytotoxic edema  encompassing an area of 8 centimeters in the right hemisphere corresponding to the posterior right MCA territory. Posterior right insula and temporal lobe involvement. There is some superimposed chronic encephalomalacia underlying the acute ischemia as seen in 2017. There is no associated hemorrhage or significant mass effect. Superimposed much smaller area of cortical and subcortical white matter hypodensity in the contralateral posterior left MCA territory best seen on series 2, image 16. This is new since 2017. Chronic but increased hypodensity in the bilateral corona radiata and left thalamus since 2017. Chronic cerebellar infarcts appear stable and greater in the right hemisphere. Stable brainstem. No midline shift, ventriculomegaly. Vascular: Calcified atherosclerosis at the skull base. No suspicious intracranial vascular hyperdensity. Skull: No acute osseous abnormality identified. Hyperostosis of the calvarium, normal variant. Sinuses/Orbits: Chronic sphenoid mucoperiosteal thickening. Mild left sphenoid mucosal thickening and bubbly opacity is chronic but increased. Tympanic cavities and mastoids remain clear. Other: No acute orbit or scalp soft tissue findings. ASPECTS [facility] Stroke Program Early CT Score) - Ganglionic level infarction (caudate, lentiform nuclei, internal capsule, insula, M1-M3 cortex): 4 - Supraganglionic infarction (M4-M6 cortex): 1 Total score (0-10 with 10 being normal): 5 (in the right hemisphere). IMPRESSION: 1. Acute on chronic posterior Right MCA territory infarct. No hemorrhage or mass effect. ASPECTS 5. 2. Superimposed age indeterminate much smaller posterior Left MCA territory ischemia (series 2, image 16), new since 2017. 3. Underlying chronic small vessel disease and chronic bilateral cerebellar infarcts. 4. Study discussed by telephone with Dr. Julianne Rice on 08/19/2018 at 10:06 . Electronically Signed   By: Genevie Ann M.D.   On: 08/19/2018 10:07   2D Echocardiogram    - Left ventricle: The cavity size was normal. Wall thickness was increased in a pattern of moderate LVH. Systolic function was normal. The estimated ejection fraction was in the range of 50% to 55%. Wall motion was normal; there were no regional wall motion abnormalities. Doppler parameters are consistent with abnormal left ventricular relaxation (grade 1 diastolic dysfunction). Doppler parameters are consistent with high ventricular filling pressure. - Aortic valve: There was mild regurgitation. Valve area (VTI): 2.27 cm^2. Valve area (Vmax): 2.01 cm^2. Valve area (Vmean): 2.14 cm^2. - Atrial septum: No defect or patent foramen ovale was identified.  Carotid Doppler   There is 1-39% bilateral ICA stenosis. Vertebral artery flow is antegrade.    PHYSICAL EXAM Pleasant frail elderly Caucasian lady currently not in distress. Afebrile. Head is nontraumatic. Neck is supple without bruit.  Cardiac exam no murmur or gallop. Lungs are clear to auscultation. Distal pulses are well felt.  Neurological Exam :   awake alert oriented 2 initially with not orientation to time but later AAOx3. Follows simple midline and one and few two-step commands. No dysarthria or aphasia. Extraocular movements are full range. Mild saccadic dysmetria to left. Left homonymous hemianopsia. Mild left facial droop. Tongue midline. Motor system exam reveals symmetric upper and lower distended without focal weakness. Orbits right over left upper extremity. Sensation appears intact and no ataxia. DTR 1+ and no babinski. Gait not tested.  ASSESSMENT/PLAN Rebecca Hardy is a 84 y.o. female with history of HTN, DM, HLD, CKD stage 3, prior stroke presenting  to [facility]/31 with slurred speech and L HP. CT showed a subacute infarct so not a candidate for tPA. Transferred to [facility].   Stroke:  Large right MCA infarct embolic secondary to unknown source. Highly suspicious for AF  Code Stroke CT head acute on chronic R MCA  infarct. Smaller L MCA infarct new since 2017. Old B cerebellar infarcts.    MRI / MRA  Large R MCA M2 territory infarct w/ corresponding thrombosis.   Carotid Doppler  B ICA 1-39% stenosis, VAs antegrade   2D Echo  EF 50-55%. No source of embolus   TEE and loop pending.  LDL 42  HgbA1c 11.6  Lovenox 40 mg sq daily for VTE prophylaxis  No antithrombotic prior to admission, now on aspirin 325 mg daily. Continue aspirin 325 mg daily for now.  Therapy recommendations:  pending   Disposition:  pending   Hypertensive Urgency  As high as BP 189/134   BP now Stable . Gradually normalize BP during hospitalization . Long-term BP goal normotensive  Hyperlipidemia  Home meds:  crestor 20 and zetia 10  On simvastatin 40 in hospital  LDL 42, goal < 70  Ok to Continue home statin at discharge  Diabetes type II  HgbA1c 11.6, goal < 7.0  Uncontrolled  Hyperglycemia improving  SSI  CBG monitoring  PCP close follow up for better DM control  UTI  UA WBC > 50  On rocephin  Other Stroke Risk Factors  Advanced age  Hx stroke in 2009 but no details available  Other Active Problems  Hx breast cancer  CKD stage III - Cre 1.60  Hospital day # 2  Rebecca Hawking, MD PhD Stroke Neurology 08/21/2018 1:21 PM    To contact Stroke Continuity provider, please refer to [email or web address] After hours, contact General Neurology

## 2018-08-21 NOTE — Plan of Care (Signed)
  RD consulted for nutrition education regarding diabetes.   Lab Results  Component Value Date   HGBA1C 11.6 (H) 08/20/2018    RD provided "Carbohydrate Counting for People with Diabetes" handout from the Academy of Nutrition and Dietetics. Discussed different food groups and their effects on blood sugar, emphasizing carbohydrate-containing foods. Provided list of carbohydrates and recommended serving sizes of common foods.  Discussed importance of controlled and consistent carbohydrate intake throughout the day. Provided examples of ways to balance meals/snacks and encouraged intake of high-fiber, whole grain complex carbohydrates. Discussed diabetic friendly drink options. Teach back method used.  Expect good compliance.  Body mass index is 29.38 kg/m. Pt meets criteria for overweight based on current BMI.  Current diet order is heart healthy/carbohydrate modified, patient is consuming approximately 95% of meals at this time. Labs and medications reviewed. No further nutrition interventions warranted at this time. RD contact information provided. If additional nutrition issues arise, please re-consult RD.  Corrin Parker, MS, RD, LDN Pager # [phone] After hours/ weekend pager # [phone]

## 2018-08-21 NOTE — Progress Notes (Signed)
PROGRESS NOTE  Rebecca Hardy HQI:[id number] DOB: Oct 07, 1933 DOA: 08/19/2018 PCP: Celene Squibb, MD  Brief Narrative: 84 year old woman presented with slurred speech, left-sided weakness, left-sided facial droop.  CT showed large right MCA distribution infarct.  Not a candidate for TPA since site of window  Assessment/Plan Large acute right MCA ischemic infarct.  LDL 42.  EKG shows apparent junctional tachycardia, inferior infarct, prolonged QT.   --Repeat EKG 11/1 showed junctional tachycardia.  QT decreased. --Continue aspirin, statin.  Plans for TEE and loop recorder 11/4.  Narrow complex tachycardia,  --Discussed with Dr. Virgina Jock yesterday, thought to be asymptomatic AVNRT episodes.  There is no hemodynamic stability.  Given recurrence will dose with IV metoprolol.  If recurs again, start IV metoprolol twice daily.  Hyperlipidemia --Continue statin  DM type 2.  Hemoglobin A1c 11.6. --CBG stable.  Hold oral agents.  CKD stage III --Has been stable   IV metoprolol 2.5 now, start 12.5 mg BID if recurs  Plan for loop recorder and TEE Monday.  DVT prophylaxis: enoxaparin Code Status: Full Family Communication: son and daughter at bedside Disposition Plan: CIR vs SNF?    Rebecca Hodgkins, MD  [facility] Direct contact: [phone] --Via amion app OR  --[email or web address]; password [email or web address]  7PM-7AM contact night coverage as above 08/21/2018, 8:13 AM  LOS: 2 days   Consultants:  Neurology   Procedures:  Echo Study Conclusions  - Left ventricle: The cavity size was normal. Wall thickness was   increased in a pattern of moderate LVH. Systolic function was   normal. The estimated ejection fraction was in the range of 50%   to 55%. Wall motion was normal; there were no regional wall   motion abnormalities. Doppler parameters are consistent with   abnormal left ventricular relaxation (grade 1 diastolic   dysfunction). Doppler parameters are consistent with high  ventricular filling pressure. - Aortic valve: There was mild regurgitation. Valve area (VTI):   2.27 cm^2. Valve area (Vmax): 2.01 cm^2. Valve area (Vmean): 2.14   cm^2. - Atrial septum: No defect or patent foramen ovale was identified.  Antimicrobials:    Interval history/Subjective: Feels fine, no pain, no SOB  Objective: Vitals:  Vitals:   08/20/18 2354 08/21/18 0727  BP: 117/60   Pulse: 76   Resp: 15   Temp: 98.6 F (37 C) 98.5 F (36.9 C)  SpO2: 95%     Exam:  Constitutional:   . Appears calm and comfortable Eyes:  . pupils and irises appear normal ENMT:  . grossly normal hearing  Respiratory:  . CTA bilaterally, no w/r/r.  . Respiratory effort normal.  Cardiovascular:  . Tachycardic, no m/r/g . No LE extremity edema   . Telemetry narrow complex tachycardia 130s Musculoskeletal:  . RUE, LUE, RLE, LLE   o strength and tone normal, no atrophy, no abnormal movements Neurologic:  . No gross changes noted Psychiatric:  . Mental status o Mood, affect appropriate  I have personally reviewed the following:   Data: . CBG stable  Scheduled Meds: . aspirin  300 mg Rectal Daily   Or  . aspirin  325 mg Oral Daily  . enoxaparin (LOVENOX) injection  40 mg Subcutaneous Q24H  . ezetimibe  10 mg Oral Daily  . insulin aspart  0-15 Units Subcutaneous TID WC  . insulin aspart  0-5 Units Subcutaneous QHS  . sertraline  25 mg Oral Daily  . simvastatin  40 mg Oral q1800   Continuous Infusions: . sodium chloride 75 mL/hr at  08/20/18 1800  . sodium chloride 20 mL/hr at 08/21/18 0500  . cefTRIAXone (ROCEPHIN)  IV Stopped (08/20/18 1401)    Principal Problem:   Acute ischemic cerebrovascular accident (CVA) involving right middle cerebral artery territory [facility]) Active Problems:   Acute lower UTI   CVA (cerebral vascular accident) ([place])   Left-sided weakness   HTN (hypertension)   HLD (hyperlipidemia)   CKD (chronic kidney disease) stage 3, GFR 30-59 ml/min  (HCC)   Type 2 diabetes mellitus with hyperlipidemia (HCC)   Narrow complex tachycardia ([place])   LOS: 2 days

## 2018-08-21 NOTE — Progress Notes (Signed)
Physical Therapy Treatment Patient Details Name: Rebecca Hardy MRN: [id number] DOB: 21-Mar-1933 Today's Date: 08/21/2018    History of Present Illness Pt is an 84 y/o female who presented to the [facility] ED on Thursday morning for evaluation of slurred speech and left sided weakness. MRI revealed MRI showed large nonhemorrhagic acute infarction right MCA posterior M2 division territory. tPA not given. PMH including but not limited to HTN, DM, HLD, CKD3 and prior CVA.     PT Comments    Patient seen for mobility progression. Pt tolerated session well and able to progress gait distance. Pt requires min/mod A for gait training due to impaired balance and L side inattention and max cues for navigating environment. Pt is easily distracted by environment and disoriented to time and situation. Family present in room and supportive. Continue to recommend CIR for further skilled PT services to maximize independence and safety with mobility.    Follow Up Recommendations  CIR     Equipment Recommendations  None recommended by PT    Recommendations for Other Services       Precautions / Restrictions Precautions Precautions: Fall Precaution Comments: L inattention Restrictions Weight Bearing Restrictions: No    Mobility  Bed Mobility Overal bed mobility: Needs Assistance Bed Mobility: Supine to Sit;Sit to Supine     Supine to sit: Supervision Sit to supine: Min guard   General bed mobility comments: increased time and effort  Transfers Overall transfer level: Needs assistance Equipment used: Rolling walker (2 wheeled) Transfers: Sit to/from Stand Sit to Stand: Min assist         General transfer comment: assist for balance and cues for safety when returning to sitting on EOB as pt began sitting prematurley  Ambulation/Gait Ambulation/Gait assistance: Min assist;Mod assist Gait Distance (Feet): (12ft X4 trials ) Assistive device: Rolling walker (2 wheeled)(HHA for short  distance in room) Gait Pattern/deviations: Step-through pattern;Decreased step length - right;Decreased step length - left;Decreased stride length;Drifts right/left;Trunk flexed Gait velocity: decreased   General Gait Details: L side inattention and balance deficits; pt requires max cues for navigating environment and requires assistance for balance and safe use of AD    Stairs             Wheelchair Mobility    Modified Rankin (Stroke Patients Only) Modified Rankin (Stroke Patients Only) Pre-Morbid Rankin Score: Moderate disability Modified Rankin: Moderately severe disability     Balance Overall balance assessment: Needs assistance Sitting-balance support: Feet supported Sitting balance-Leahy Scale: Fair     Standing balance support: Bilateral upper extremity supported Standing balance-Leahy Scale: Poor                              Cognition Arousal/Alertness: Awake/alert Behavior During Therapy: WFL for tasks assessed/performed Overall Cognitive Status: Impaired/Different from baseline Area of Impairment: Orientation;Attention;Memory;Following commands;Safety/judgement;Awareness;Problem solving                 Orientation Level: Disoriented to;Situation;Time(reports April 2019) Current Attention Level: Focused Memory: Decreased recall of precautions;Decreased short-term memory Following Commands: Follows one step commands with increased time Safety/Judgement: Decreased awareness of safety;Decreased awareness of deficits Awareness: Intellectual Problem Solving: Slow processing;Decreased initiation;Difficulty sequencing;Requires verbal cues General Comments: easily distracted by environment      Exercises      General Comments General comments (skin integrity, edema, etc.): daughter and 2 sons present in room      Pertinent Vitals/Pain Pain Assessment: No/denies pain  Home Living                      Prior Function             PT Goals (current goals can now be found in the care plan section) Acute Rehab PT Goals Patient Stated Goal: return home Progress towards PT goals: Progressing toward goals    Frequency    Min 4X/week      PT Plan Current plan remains appropriate    Co-evaluation              AM-PAC PT "6 Clicks" Daily Activity  Outcome Measure  Difficulty turning over in bed (including adjusting bedclothes, sheets and blankets)?: Unable Difficulty moving from lying on back to sitting on the side of the bed? : Unable Difficulty sitting down on and standing up from a chair with arms (e.g., wheelchair, bedside commode, etc,.)?: Unable Help needed moving to and from a bed to chair (including a wheelchair)?: A Little Help needed walking in hospital room?: A Lot Help needed climbing 3-5 steps with a railing? : A Little 6 Click Score: 11    End of Session Equipment Utilized During Treatment: Gait belt Activity Tolerance: Patient tolerated treatment well Patient left: with call bell/phone within reach;with family/visitor present;in bed Nurse Communication: Mobility status PT Visit Diagnosis: Other abnormalities of gait and mobility (R26.89);Other symptoms and signs involving the nervous system (R29.898)     Time: 7035-0093 PT Time Calculation (min) (ACUTE ONLY): 34 min  Charges:  $Gait Training: 8-22 mins $Therapeutic Activity: 8-22 mins                     Earney Navy, PTA Acute Rehabilitation Services Pager: [phone] Office: [phone]     Darliss Cheney 08/21/2018, 4:08 PM

## 2018-08-21 NOTE — Progress Notes (Signed)
Subjective:  Back in tachycardia. Unfortunately, EKG was not performed yesterday while patient was in sinus rhythm, as it would have been helpful to compare.    Objective:  Vital Signs in the last 24 hours: Temp:  [97.6 F (36.4 C)-98.6 F (37 C)] 98.4 F (36.9 C) (11/02 1119) Pulse Rate:  [76-120] 112 (11/02 1119) Resp:  [14-16] 16 (11/02 1119) BP: (111-127)/(60-81) 127/81 (11/02 1119) SpO2:  [95 %-100 %] 99 % (11/02 1119)  Intake/Output from previous day: 11/01 0701 - 11/02 0700 In: 1171.8 [I.V.:1071.8; IV Piggyback:100] Out: 1 [Urine:1] Intake/Output from this shift: No intake/output data recorded.  Physical Exam: Constitutional: She is oriented to person, place, and time. She appears well-developed and well-nourished. No distress.  Neck: No JVD present.  Cardiovascular: Normal rate, regular rhythm and normal heart sounds.  No murmur heard. Pulses:      Dorsalis pedis pulses are 1+ on the right side, and 1+ on the left side.       Posterior tibial pulses are 0 on the right side, and 0 on the left side.  Respiratory: Effort normal and breath sounds normal.  GI: Soft. Bowel sounds are normal. There is no tenderness. There is no rebound.  Musculoskeletal: She exhibits edema (Trace edema b/l LE).  Lymphadenopathy:    She has no cervical adenopathy.  Neurological: She is alert and oriented to person, place, and time.  Mild left UE weakness  Skin: Skin is warm and dry.  Psychiatric: She has a normal mood and affect   Lab Results: Recent Labs    08/19/18 0935 08/19/18 0946  WBC 11.1*  --   HGB 13.0 13.3  PLT 155  --    Recent Labs    08/19/18 0935 08/19/18 0946  NA 132* 133*  K 3.7 3.8  CL 98 98  CO2 24  --   GLUCOSE 190* 185*  BUN 29* 29*  CREATININE 1.63* 1.60*   No results for input(s): TROPONINI in the last 72 hours.  Invalid input(s): CK, MB Hepatic Function Panel Recent Labs    08/19/18 0935  PROT 7.2  ALBUMIN 4.2  AST 35  ALT 17  ALKPHOS  51  BILITOT 1.1   Recent Labs    08/20/18 0955  CHOL 94   Cardiac studies: EKG 08/20/2018 1547: Short RP tachycardia 120 bpm. Normal axis. Normal conduction. Compared to baseline EKG in 2017, retrograde P waves seen in leads II, III are new. Unfortunately, telemetry monitor does not reveal adequate strips to show onset and offset of this rhythm.  Echocardiogram: 08/19/2018: Study Conclusions  - Left ventricle: The cavity size was normal. Wall thickness was   increased in a pattern of moderate LVH. Systolic function was   normal. The estimated ejection fraction was in the range of 50%   to 55%. Wall motion was normal; there were no regional wall   motion abnormalities. Doppler parameters are consistent with   abnormal left ventricular relaxation (grade 1 diastolic   dysfunction). Doppler parameters are consistent with high   ventricular filling pressure. - Aortic valve: There was mild regurgitation. Valve area (VTI):   2.27 cm^2. Valve area (Vmax): 2.01 cm^2. Valve area (Vmean): 2.14   cm^2. - Atrial septum: No defect or patent foramen ovale was identified.   LOS: 2 days   Assessment/Recommendations:  84 y/o caucasian female with hypertension, hyperlipidemia, DM, CKD stage III, prior CVA, h/o DCIS of left breast, now with Rt MCA CVA and episodes of tachycardia:  Tachycardia: Narrow complex  tachycardia. Appears probable sinus tachycardia on repeat EKG. She is hemodynamically stable and asymptomatic. Recommend metoprolol tartarate 25 mg PO bid. She may still benefit from loop recorder to rule put Afib/futter. For acute episodes of such tachycardia not relieved with vagal maneuvers (other than carotid massage), could use IV metoprolol 2.5 mg as needed X 2.  Rest of the management per the primary team. TEE and loop recorder by [facility] heartcare has been requested by Neurology team.  Reynold Bowen Jerid Catherman 08/21/2018, 11:35 AM  Nigel Mormon, MD [facility] Cardiovascular.  [place] Pager: [phone] Office: [phone] If no answer Cell [phone]

## 2018-08-22 LAB — GLUCOSE, CAPILLARY
GLUCOSE-CAPILLARY: 124 mg/dL — AB (ref 70–99)
GLUCOSE-CAPILLARY: 189 mg/dL — AB (ref 70–99)
Glucose-Capillary: 129 mg/dL — ABNORMAL HIGH (ref 70–99)
Glucose-Capillary: 167 mg/dL — ABNORMAL HIGH (ref 70–99)

## 2018-08-22 LAB — URINE CULTURE: Culture: >=100000 — AB

## 2018-08-22 NOTE — Progress Notes (Signed)
STROKE TEAM PROGRESS NOTE  INTERVAL HISTORY Her daughter is at the bedside.  She is sitting in chair, no acute distress. No acute event overnight. Pending TEE and loop.    Vitals:   08/21/18 1119 08/22/18 0016 08/22/18 0728 08/22/18 1002  BP: 127/81 (!) 152/117 133/62 (!) 124/54  Pulse: (!) 112 61  62  Resp: 16 20    Temp: 98.4 F (36.9 C) 98.6 F (37 C) 98.5 F (36.9 C)   TempSrc: Oral Oral Axillary   SpO2: 99% 100%    Weight:      Height:        CBC:  Recent Labs  Lab 08/19/18 0935 08/19/18 0946  WBC 11.1*  --   NEUTROABS 6.1  --   HGB 13.0 13.3  HCT 39.5 39.0  MCV 88.6  --   PLT 155  --     Basic Metabolic Panel:  Recent Labs  Lab 08/19/18 0935 08/19/18 0946  NA 132* 133*  K 3.7 3.8  CL 98 98  CO2 24  --   GLUCOSE 190* 185*  BUN 29* 29*  CREATININE 1.63* 1.60*  CALCIUM 9.2  --    Lipid Panel:     Component Value Date/Time   CHOL 94 08/20/2018 0955   TRIG 82 08/20/2018 0955   HDL 36 (L) 08/20/2018 0955   CHOLHDL 2.6 08/20/2018 0955   VLDL 16 08/20/2018 0955   LDLCALC 42 08/20/2018 0955   HgbA1c:  Lab Results  Component Value Date   HGBA1C 11.6 (H) 08/20/2018   Urine Drug Screen:     Component Value Date/Time   LABOPIA NONE DETECTED 08/19/2018 0935   COCAINSCRNUR NONE DETECTED 08/19/2018 0935   LABBENZ NONE DETECTED 08/19/2018 0935   AMPHETMU NONE DETECTED 08/19/2018 0935   THCU NONE DETECTED 08/19/2018 0935   LABBARB NONE DETECTED 08/19/2018 0935    Alcohol Level     Component Value Date/Time   ETH <10 08/19/2018 0935    IMAGING  Mr Jodene Nam Head/brain Wo Cm 08/19/2018 IMPRESSION:  Large, nonhemorrhagic, acute infarction involving RIGHT MCA posterior M2 division territory, with corresponding thrombosis of that vessel on MRA. No significant midline shift or brain swelling at this time. Superimposed chronic ischemia in the brain, and chronic microvascular ischemic change.    Ct Head Code Stroke Wo Contrast 08/19/2018 IMPRESSION:  1.  Acute on chronic posterior Right MCA territory infarct. No hemorrhage or mass effect. ASPECTS 5.  2. Superimposed age indeterminate much smaller posterior Left MCA territory ischemia (series 2, image 16), new since 2017.  3. Underlying chronic small vessel disease and chronic bilateral cerebellar infarcts.    2D Echocardiogram   - Left ventricle: The cavity size was normal. Wall thickness was increased in a pattern of moderate LVH. Systolic function was normal. The estimated ejection fraction was in the range of 50% to 55%. Wall motion was normal; there were no regional wall motion abnormalities. Doppler parameters are consistent with abnormal left ventricular relaxation (grade 1 diastolic dysfunction). Doppler parameters are consistent with high ventricular filling pressure. - Aortic valve: There was mild regurgitation. Valve area (VTI): 2.27 cm^2. Valve area (Vmax): 2.01 cm^2. Valve area (Vmean): 2.14 cm^2. - Atrial septum: No defect or patent foramen ovale was identified.   Carotid Doppler   There is 1-39% bilateral ICA stenosis. Vertebral artery flow is antegrade.     PHYSICAL EXAM Pleasant frail elderly Caucasian lady currently not in distress. Afebrile. Head is nontraumatic. Neck is supple without bruit.  Cardiac exam  no murmur or gallop. Lungs are clear to auscultation. Distal pulses are well felt.  Neurological Exam :   awake alert oriented 2 initially with not orientation to time but later AAOx3. Follows simple midline and one and few two-step commands. No dysarthria or aphasia. Extraocular movements are full range. Mild saccadic dysmetria to left. Left homonymous hemianopsia. Mild left facial droop. Tongue midline. Motor system exam reveals symmetric upper and lower distended without focal weakness. Orbits right over left upper extremity. Sensation appears intact and no ataxia. DTR 1+ and no babinski. Gait not tested.   ASSESSMENT/PLAN Rebecca Hardy is a 84 y.o. female  with history of HTN, DM, HLD, CKD stage 3, prior stroke presenting to [facility] 10/31 with slurred speech and L HP. CT showed a subacute infarct so not a candidate for tPA. Transferred to [facility].   Stroke:  Large right MCA infarct embolic secondary to unknown source. Highly suspicious for AF  Code Stroke CT head acute on chronic R MCA infarct. Smaller L MCA infarct new since 2017. Old B cerebellar infarcts.    MRI / MRA  Large R MCA M2 territory infarct w/ corresponding thrombosis.   Carotid Doppler  B ICA 1-39% stenosis, VAs antegrade   2D Echo  EF 50-55%. No source of embolus   TEE and loop pending - tentatively on Monday  LDL 42  HgbA1c 11.6  Lovenox 40 mg sq daily for VTE prophylaxis  No antithrombotic prior to admission, now on aspirin 325 mg daily. Continue aspirin 325 mg daily for now.  Therapy recommendations:  CIR recommended  Disposition:  pending   Hypertensive Urgency  As high as BP 189/134   BP now Stable . Gradually normalize BP during hospitalization . Long-term BP goal normotensive  Hyperlipidemia  Home meds:  crestor 20 and zetia 10  On simvastatin 40 in hospital  LDL 42, goal < 70  Ok to Continue home statin at discharge  Diabetes type II  HgbA1c 11.6, goal < 7.0  Uncontrolled  Hyperglycemia improving  SSI  CBG monitoring  PCP close follow up for better DM control  UTI  UA WBC > 50  Now off rocephin - completed 3 doses  Other Stroke Risk Factors  Advanced age  Hx stroke in 2009 but no details available  Other Active Problems  Hx breast cancer  CKD stage III - Cre 1.60  Hospital day # 3  Rebecca Hawking, MD PhD Stroke Neurology 08/22/2018 2:23 PM     To contact Stroke Continuity provider, please refer to [email or web address] After hours, contact General Neurology

## 2018-08-22 NOTE — Progress Notes (Signed)
PROGRESS NOTE  Rebecca Hardy YHC:[id number] DOB: January 29, 1933 DOA: 08/19/2018 PCP: Celene Squibb, MD  Brief Narrative: 84 year old woman presented with slurred speech, left-sided weakness, left-sided facial droop.  CT showed large right MCA distribution infarct.  Not a candidate for TPA since site of window  Assessment/Plan Large acute right MCA ischemic infarct.  LDL 42.  EKG shows apparent junctional tachycardia, inferior infarct, prolonged QT.   --Has had intermittent junctional tachycardia, currently in sinus rhythm.  Continue metoprolol.   --Continue aspirin, statin.  Plans for TEE and loop recorder 11/4.  Narrow complex tachycardia,  --Thought to be asymptomatic AVNRT episodes.  Now in sinus rhythm on oral metoprolol.  Hyperlipidemia --Continue statin  DM type 2.  Hemoglobin A1c 11.6. --CBG remains stable.  Hold oral agents.  Continue sliding scale insulin.  CKD stage III --Has been stable   Doing well. Plan for loop recorder and TEE Monday.  Can transfer to skilled nursing facility thereafter.  DVT prophylaxis: enoxaparin Code Status: Full Family Communication: Daughter at bedside Disposition Plan: CIR vs SNF?    [name], MD  [facility] Direct contact: [phone] --Via amion app OR  --[email or web address]; password [email or web address]  7PM-7AM contact night coverage as above 08/22/2018, 10:01 AM  LOS: 3 days   Consultants:  Neurology   Procedures:  Echo Study Conclusions  - Left ventricle: The cavity size was normal. Wall thickness was   increased in a pattern of moderate LVH. Systolic function was   normal. The estimated ejection fraction was in the range of 50%   to 55%. Wall motion was normal; there were no regional wall   motion abnormalities. Doppler parameters are consistent with   abnormal left ventricular relaxation (grade 1 diastolic   dysfunction). Doppler parameters are consistent with high   ventricular filling pressure. - Aortic valve: There  was mild regurgitation. Valve area (VTI):   2.27 cm^2. Valve area (Vmax): 2.01 cm^2. Valve area (Vmean): 2.14   cm^2. - Atrial septum: No defect or patent foramen ovale was identified.  Antimicrobials:    Interval history/Subjective: Feels well, no neurologic complaints, no shortness of breath.  Objective: Vitals:  Vitals:   08/22/18 0016 08/22/18 0728  BP: (!) 152/117 133/62  Pulse: 61   Resp: 20   Temp: 98.6 F (37 C) 98.5 F (36.9 C)  SpO2: 100%     Exam: Constitutional:   . Appears calm and comfortable Eyes:  . pupils and irises appear normal ENMT:  . grossly normal hearing  Respiratory:  . CTA bilaterally, no w/r/r.  . Respiratory effort normal.  Cardiovascular:  . RRR, no m/r/g . No LE extremity edema   . Telemetry sinus rhythm Musculoskeletal:  . RUE, LUE, RLE, LLE   o strength and tone normal, no atrophy, no abnormal movements Neurologic:  . CN grossly nonfocal Psychiatric:  . Mental status o Mood, affect appropriate  I have personally reviewed the following:   Data: . CBG remains stable.  Scheduled Meds: . aspirin  300 mg Rectal Daily   Or  . aspirin  325 mg Oral Daily  . enoxaparin (LOVENOX) injection  40 mg Subcutaneous Q24H  . ezetimibe  10 mg Oral Daily  . insulin aspart  0-15 Units Subcutaneous TID WC  . insulin aspart  0-5 Units Subcutaneous QHS  . metoprolol tartrate  25 mg Oral BID  . sertraline  25 mg Oral Daily  . simvastatin  40 mg Oral q1800   Continuous Infusions: . sodium chloride 75 mL/hr at  08/22/18 0557  . sodium chloride 20 mL/hr at 08/21/18 0500  . cefTRIAXone (ROCEPHIN)  IV 1 g (08/21/18 1223)    Principal Problem:   Acute ischemic cerebrovascular accident (CVA) involving right middle cerebral artery territory [facility]) Active Problems:   Acute lower UTI   CVA (cerebral vascular accident) ([place])   Left-sided weakness   HTN (hypertension)   HLD (hyperlipidemia)   CKD (chronic kidney disease) stage 3, GFR 30-59  ml/min (HCC)   Type 2 diabetes mellitus with hyperlipidemia (HCC)   Narrow complex tachycardia ([place])   LOS: 3 days

## 2018-08-22 NOTE — Progress Notes (Signed)
Telemetry reviewed. Appears sinus rhythm in 60s. Obtain EKG for comparison. Tolerating metoprolol 25 mg bid well.Nigel Mormon, MD [facility] Cardiovascular. [place] Pager: [phone] Office: [phone] If no answer Cell [phone]

## 2018-08-23 ENCOUNTER — Inpatient Hospital Stay (HOSPITAL_COMMUNITY): Payer: Medicare Other

## 2018-08-23 ENCOUNTER — Encounter (HOSPITAL_COMMUNITY): Payer: Self-pay

## 2018-08-23 ENCOUNTER — Encounter (HOSPITAL_COMMUNITY)
Admission: EM | Disposition: A | Discharge status: Rehabilitation Facility | Payer: Self-pay | Source: Home / Self Care | Attending: Family Medicine

## 2018-08-23 ENCOUNTER — Inpatient Hospital Stay (HOSPITAL_COMMUNITY): Payer: Medicare Other | Admitting: Anesthesiology

## 2018-08-23 DIAGNOSIS — I6389 Other cerebral infarction: Secondary | ICD-10-CM

## 2018-08-23 DIAGNOSIS — I340 Nonrheumatic mitral (valve) insufficiency: Secondary | ICD-10-CM

## 2018-08-23 HISTORY — PX: LOOP RECORDER INSERTION: EP1214

## 2018-08-23 HISTORY — PX: TEE WITHOUT CARDIOVERSION: SHX5443

## 2018-08-23 LAB — GLUCOSE, CAPILLARY
GLUCOSE-CAPILLARY: 176 mg/dL — AB (ref 70–99)
GLUCOSE-CAPILLARY: 166 mg/dL — AB (ref 70–99)
Glucose-Capillary: 164 mg/dL — ABNORMAL HIGH (ref 70–99)

## 2018-08-23 SURGERY — LOOP RECORDER INSERTION

## 2018-08-23 SURGERY — ECHOCARDIOGRAM, TRANSESOPHAGEAL
Anesthesia: Monitor Anesthesia Care

## 2018-08-23 MED ORDER — PHENYLEPHRINE 40 MCG/ML (10ML) SYRINGE FOR IV PUSH (FOR BLOOD PRESSURE SUPPORT)
PREFILLED_SYRINGE | INTRAVENOUS | Status: DC | PRN
  Administered 2018-08-23: 80 ug via INTRAVENOUS

## 2018-08-23 MED ORDER — LACTATED RINGERS IV SOLN
INTRAVENOUS | Status: DC
  Administered 2018-08-23: 1000 mL via INTRAVENOUS

## 2018-08-23 MED ORDER — LIDOCAINE-EPINEPHRINE 1 %-1:100000 IJ SOLN
INTRAMUSCULAR | Status: AC
  Filled 2018-08-23: qty 1

## 2018-08-23 MED ORDER — PROPOFOL 500 MG/50ML IV EMUL
INTRAVENOUS | Status: DC | PRN
  Administered 2018-08-23: 75 ug/kg/min via INTRAVENOUS

## 2018-08-23 MED ORDER — LIDOCAINE HCL (CARDIAC) PF 100 MG/5ML IV SOSY
PREFILLED_SYRINGE | INTRAVENOUS | Status: DC | PRN
  Administered 2018-08-23: 40 mg via INTRAVENOUS

## 2018-08-23 MED ORDER — ENOXAPARIN SODIUM 30 MG/0.3ML ~~LOC~~ SOLN
30.0000 mg | SUBCUTANEOUS | Status: DC
  Filled 2018-08-23: qty 0.3

## 2018-08-23 MED ORDER — LIDOCAINE-EPINEPHRINE 1 %-1:100000 IJ SOLN
INTRAMUSCULAR | Status: DC | PRN
  Administered 2018-08-23: 10 mL

## 2018-08-23 MED ORDER — PROPOFOL 10 MG/ML IV BOLUS
INTRAVENOUS | Status: DC | PRN
  Administered 2018-08-23: 10 mg via INTRAVENOUS
  Administered 2018-08-23: 20 mg via INTRAVENOUS

## 2018-08-23 SURGICAL SUPPLY — 2 items
LOOP REVEAL LINQSYS (Prosthesis & Implant Heart) ×1 IMPLANT
PACK LOOP INSERTION (CUSTOM PROCEDURE TRAY) ×2 IMPLANT

## 2018-08-23 NOTE — Progress Notes (Signed)
SLP Cancellation Note  Patient Details Name: Rebecca Hardy MRN: [id number] DOB: Feb 06, 1933   Cancelled treatment:        Patient off unit for test/procedure. Will continue attempt.                                                                                                Charlynne Cousins Jeovanni Heuring, MA, CCC-SLP 08/23/2018 12:46 PM

## 2018-08-23 NOTE — Anesthesia Postprocedure Evaluation (Signed)
Anesthesia Post Note  Patient: Rebecca Hardy  Procedure(s) Performed: TRANSESOPHAGEAL ECHOCARDIOGRAM (TEE) (N/A )     Patient location during evaluation: PACU Anesthesia Type: MAC Level of consciousness: awake and alert Pain management: pain level controlled Vital Signs Assessment: post-procedure vital signs reviewed and stable Respiratory status: spontaneous breathing, nonlabored ventilation and respiratory function stable Cardiovascular status: blood pressure returned to baseline and stable Postop Assessment: no apparent nausea or vomiting Anesthetic complications: no    Last Vitals:  Vitals:   08/23/18 1120 08/23/18 1130  BP: (!) 103/31 (!) 106/48  Pulse: 65 62  Resp: (!) 21 15  Temp:    SpO2: 97% 99%    Last Pain:  Vitals:   08/23/18 1130  TempSrc:   PainSc: 0-No pain                 Brennan Bailey

## 2018-08-23 NOTE — Progress Notes (Signed)
PROGRESS NOTE  Rebecca Hardy UDJ:[id number] DOB: October 19, 1933 DOA: 08/19/2018 PCP: Celene Squibb, MD  Brief Narrative: 84 year old woman presented with slurred speech, left-sided weakness, left-sided facial droop.  CT showed large right MCA distribution infarct.  Not a candidate for TPA since site of window  Assessment/Plan Large acute right MCA ischemic infarct.  LDL 42.  EKG shows apparent junctional tachycardia, inferior infarct, prolonged QT.   --Stable.  Continue aspirin and statin.  TEE today.  Loop recorder also being considered.   --We will need rehab once work-up is complete.  CIR work-up in process.  Narrow complex tachycardia,  --Thought to be asymptomatic AVNRT episodes.  In sinus rhythm on telemetry earlier today.  Electrophysiology seen today for consideration of loop recorder. --Continue metoprolol.  Hyperlipidemia --Continue statin  DM type 2.  Hemoglobin A1c 11.6. --CBG remains stable.  Continue to hold oral agents.  Continue sliding scale insulin.  CKD stage III --Has been stable   TEE today, electrophysiology considering loop recorder.  Once work-up complete, can transfer to rehab inpatient versus outpatient.  DVT prophylaxis: enoxaparin Code Status: Full Family Communication:  Disposition Plan: CIR vs SNF?    [name], MD  [facility] Direct contact: [phone] --Via amion app OR  --[email or web address]; password [email or web address]  7PM-7AM contact night coverage as above 08/23/2018, 10:48 AM  LOS: 4 days   Consultants:  Neurology   Procedures:  Echo Study Conclusions  - Left ventricle: The cavity size was normal. Wall thickness was   increased in a pattern of moderate LVH. Systolic function was   normal. The estimated ejection fraction was in the range of 50%   to 55%. Wall motion was normal; there were no regional wall   motion abnormalities. Doppler parameters are consistent with   abnormal left ventricular relaxation (grade 1 diastolic  dysfunction). Doppler parameters are consistent with high   ventricular filling pressure. - Aortic valve: There was mild regurgitation. Valve area (VTI):   2.27 cm^2. Valve area (Vmax): 2.01 cm^2. Valve area (Vmean): 2.14   cm^2. - Atrial septum: No defect or patent foramen ovale was identified.  Antimicrobials:    Interval history/Subjective: No new issues.  Ate dinner last night.  Objective: Vitals:  Vitals:   08/23/18 0737 08/23/18 1004  BP: (!) 118/42 (!) 131/47  Pulse: 61 (!) 101  Resp: 12 10  Temp: 97.7 F (36.5 C) 98.9 F (37.2 C)  SpO2: 96%     Exam: Constitutional:   . Appears calm and comfortable Respiratory:  . CTA bilaterally, no w/r/r.  . Respiratory effort normal.  Cardiovascular:  . RRR, no m/r/g . No LE extremity edema   Psychiatric:  . Mental status o Mood, affect appropriate o Speech fluent and clear.  I have personally reviewed the following:   Data: . CBG remains stable . EKG this a.m. shows narrow complex tachycardia, rate 103  Scheduled Meds: . [MAR Hold] aspirin  300 mg Rectal Daily   Or  . [MAR Hold] aspirin  325 mg Oral Daily  . [MAR Hold] enoxaparin (LOVENOX) injection  40 mg Subcutaneous Q24H  . [MAR Hold] ezetimibe  10 mg Oral Daily  . [MAR Hold] insulin aspart  0-15 Units Subcutaneous TID WC  . [MAR Hold] insulin aspart  0-5 Units Subcutaneous QHS  . [MAR Hold] metoprolol tartrate  25 mg Oral BID  . [MAR Hold] sertraline  25 mg Oral Daily  . [MAR Hold] simvastatin  40 mg Oral q1800   Continuous Infusions: . sodium  chloride 75 mL/hr at 08/22/18 2007  . sodium chloride 20 mL/hr at 08/21/18 0500  . lactated ringers 1,000 mL (08/23/18 1009)    Principal Problem:   Acute ischemic cerebrovascular accident (CVA) involving right middle cerebral artery [facility]) Active Problems:   Left-sided weakness   HTN (hypertension)   HLD (hyperlipidemia)   CKD (chronic kidney disease) stage 3, GFR 30-59 ml/min (HCC)   Type 2  diabetes mellitus with hyperlipidemia (HCC)   Narrow complex tachycardia ([place])   LOS: 4 days

## 2018-08-23 NOTE — Progress Notes (Signed)
  Echocardiogram Echocardiogram Transesophageal has been performed.  Johny Chess 08/23/2018, 11:20 AM

## 2018-08-23 NOTE — Progress Notes (Signed)
Inpatient Rehabilitation Admissions Coordinator  I have begun insurance authorization with [facility] for a possible admit to inpt rehab, I will discuss with pt and family this morning their preferences and goals.  Danne Baxter, RN, MSN Rehab Admissions Coordinator [phone] 08/23/2018 8:56 AM

## 2018-08-23 NOTE — Consult Note (Addendum)
ELECTROPHYSIOLOGY CONSULT NOTE  Patient ID: Rebecca Hardy MRN: [id number], DOB/AGE: [birth year]   Admit date: 08/19/2018 Date of Consult: 08/23/2018  Primary Physician: Celene Squibb, MD Primary Cardiologist: new to [facility] Reason for Consultation: Cryptogenic stroke; recommendations regarding Implantable Loop Recorder  History of Present Illness EP has been asked to evaluate [name] for placement of an implantable loop recorder to monitor for atrial fibrillation by Dr Erlinda Hong.  The patient was admitted on 08/19/2018 with slurred speech and left hemiparesis.  Imaging demonstrated large right MCA infarct felt to be embolic 2/2 unknown source.  she has undergone workup for stroke including echocardiogram and carotid dopplers.  The patient has been monitored on telemetry which has demonstrated sinus rhythm with intermittent long RP tachycardia.  Inpatient stroke work-up is to be completed with a TEE.   Echocardiogram this admission demonstrated normal LVEF, normal LA size.  Lab work is reviewed.  Prior to admission, the patient denies chest pain, shortness of breath, dizziness, or syncope. She has had rare palpitations. They are recovering from their stroke with plans to go to rehab at discharge.    Past Medical History:  Diagnosis Date  . 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 Diabetes mellitus   . GERD (gastroesophageal reflux disease)   . High cholesterol   . History of TIA (transient ischemic attack)   . Hypertension   . Stroke [facility])      Surgical History:  Past Surgical History:  Procedure Laterality Date  . ABDOMINAL HYSTERECTOMY    . BREAST LUMPECTOMY    . CESAREAN SECTION       Medications Prior to Admission  Medication Sig Dispense Refill Last Dose  . ezetimibe (ZETIA) 10 MG tablet Take 10 mg by mouth daily.   0 08/18/2018 at Unknown time  . furosemide (LASIX) 20 MG tablet Take 40 mg by mouth daily.    08/18/2018 at Unknown time  .  GLIPIZIDE XL 5 MG 24 hr tablet Take 5 mg by mouth.   1 08/18/2018 at Unknown time  . lisinopril (PRINIVIL,ZESTRIL) 10 MG tablet Take 20 mg by mouth daily.    08/18/2018 at Unknown time  . potassium chloride (K-DUR) 10 MEQ tablet Take 10 mEq by mouth once.   4 08/18/2018 at Unknown time  . rosuvastatin (CRESTOR) 20 MG tablet Take 20 mg by mouth daily.   1 08/18/2018 at Unknown time  . sertraline (ZOLOFT) 25 MG tablet Take 25 mg by mouth at bedtime.   2 08/18/2018 at Unknown time  . TOUJEO SOLOSTAR 300 UNIT/ML SOPN Inject 50 Units into the skin 2 (two) times daily. 20 units in the morning and 8 units at bedtime  2 08/18/2018 at 0800am    Inpatient Medications:  . aspirin  300 mg Rectal Daily   Or  . aspirin  325 mg Oral Daily  . enoxaparin (LOVENOX) injection  40 mg Subcutaneous Q24H  . ezetimibe  10 mg Oral Daily  . insulin aspart  0-15 Units Subcutaneous TID WC  . insulin aspart  0-5 Units Subcutaneous QHS  . metoprolol tartrate  25 mg Oral BID  . sertraline  25 mg Oral Daily  . simvastatin  40 mg Oral q1800    Allergies:  Allergies  Allergen Reactions  . Codeine Other (See Comments)    Unknown reaction  . Penicillins Other (See Comments)    Unknown reaction  Has patient had a PCN reaction causing immediate  rash, facial/tongue/throat swelling, SOB or lightheadedness with hypotension: Uknown Has patient had a PCN reaction causing severe rash involving mucus membranes or skin necrosis: Uknown Has patient had a PCN reaction that required hospitalization: Unknown Has patient had a PCN reaction occurring within the last 10 years: No If all of the above answers are "NO", then may proceed with Cephalosporin use.    Social History   Socioeconomic History  . Marital status: Widowed    Spouse name: Not on file  . Number of children: Not on file  . Years of education: Not on file  . Highest education level: Not on file  Occupational History  . Not on file  Social Needs  .  Financial resource strain: Not on file  . Food insecurity:    Worry: Not on file    Inability: Not on file  . Transportation needs:    Medical: Not on file    Non-medical: Not on file  Tobacco Use  . Smoking status: Never Smoker  . Smokeless tobacco: Never Used  Substance and Sexual Activity  . Alcohol use: No  . Drug use: No  . Sexual activity: Not on file  Lifestyle  . Physical activity:    Days per week: Not on file    Minutes per session: Not on file  . Stress: Not on file  Relationships  . Social connections:    Talks on phone: Not on file    Gets together: Not on file    Attends religious service: Not on file    Active member of club or organization: Not on file    Attends meetings of clubs or organizations: Not on file    Relationship status: Not on file  . Intimate partner violence:    Fear of current or ex partner: Not on file    Emotionally abused: Not on file    Physically abused: Not on file    Forced sexual activity: Not on file  Other Topics Concern  . Not on file  Social History Narrative  . Not on file     Family History  Problem Relation Age of Onset  . Cancer Mother   . Hypertension Father       Review of Systems: All other systems reviewed and are otherwise negative except as noted above.  Physical Exam: Vitals:   08/22/18 2005 08/23/18 0005 08/23/18 0300 08/23/18 0737  BP:  99/80 123/62 (!) 118/42  Pulse: (!) 107 (!) 102 60 61  Resp: (!) 21 15  12   Temp:  98.4 F (36.9 C) 98.3 F (36.8 C) 97.7 F (36.5 C)  TempSrc:  Oral Oral Axillary  SpO2: 98% 95% 96% 96%  Weight:      Height:        GEN- The patient is elderly appearing, alert and oriented x 3 today.   Head- normocephalic, atraumatic Eyes-  Sclera clear, conjunctiva pink Ears- hearing intact Oropharynx- clear Neck- supple Lungs- Clear to ausculation bilaterally, normal work of breathing Heart- Regular rate and rhythm  GI- soft, NT, ND, + BS Extremities- no clubbing,  cyanosis, or edema MS- no significant deformity or atrophy Skin- no rash or lesion Psych- euthymic mood, full affect   Labs:   Lab Results  Component Value Date   WBC 11.1 (H) 08/19/2018   HGB 13.3 08/19/2018   HCT 39.0 08/19/2018   MCV 88.6 08/19/2018   PLT 155 08/19/2018    Recent Labs  Lab 08/19/18 0935 08/19/18 0946  NA 132*  133*  K 3.7 3.8  CL 98 98  CO2 24  --   BUN 29* 29*  CREATININE 1.63* 1.60*  CALCIUM 9.2  --   PROT 7.2  --   BILITOT 1.1  --   ALKPHOS 51  --   ALT 17  --   AST 35  --   GLUCOSE 190* 185*     Radiology/Studies: Mr Brain Wo Contrast  Result Date: 08/19/2018 CLINICAL DATA:  Fall x2. This occurred yesterday. Last seen normal at 9 p.m. 08/18/2018. Slurred speech and LEFT-sided weakness are noted upon awakening today. EXAM: MRI HEAD WITHOUT CONTRAST MRA HEAD WITHOUT CONTRAST TECHNIQUE: Multiplanar, multiecho pulse sequences of the brain and surrounding structures were obtained without intravenous contrast. Angiographic images of the head were obtained using MRA technique without contrast. COMPARISON:  CT code stroke 08/19/2018.  CT head 01/01/2016. FINDINGS: Motion degraded exam. MRI HEAD FINDINGS Brain: Large area of restricted diffusion corresponding low ADC, affecting the RIGHT MCA territory as predicted from CT, representing acute RIGHT MCA posterior division M2 thrombosis with acute infarction. This affects the frontal, temporal, parietal, and insular lobes, as well as adjacent white matter. There may be posterior limb internal capsule involvement, but the lentiform nucleus is largely spared. No hemorrhage, mass lesion, hydrocephalus, or extra-axial fluid. Generalized atrophy. Hypoattenuation of white matter, likely small vessel disease. Chronic deep white matter lacunar infarcts most notable RIGHT frontal subcortical white matter adjacent to caudate, also chronic LEFT and RIGHT parietal cortical infarcts. BILATERAL cerebellar infarcts also noted of a  chronic nature. Vascular: Reported separately Skull and upper cervical spine: Normal marrow Sinuses/Orbits: No significant opacity or layering fluid. BILATERAL cataract extraction. Other: None MRA HEAD FINDINGS Motion degraded exam.  Small or subtle lesions could be overlooked. Gross patency of the internal carotid arteries bilaterally without visible flow reducing stenosis. Diffusely narrowed LEFT A1 ACA, with dominant RIGHT anterior cerebral artery largely supplying both A2 and A3 segments. Unremarkable LEFT M1 MCA and proximal M2 segments. Proximal RIGHT M1 MCA is patent, but there is some loss of signal in its distal aspect. The anterior division of the M2 MCA is patent, but there is no posterior division flow related enhancement observed consistent with thrombosis contributing to the acute pattern of infarction. The basilar artery is widely patent. Artifactual misregistration in its midportion. Both vertebrals supply the basilar. Both PCA segments are unremarkable proximally. Cerebellar branches poorly visualized. No definite saccular aneurysm. IMPRESSION: Large, nonhemorrhagic, acute infarction involving RIGHT MCA posterior M2 division territory, with corresponding thrombosis of that vessel on MRA. No significant midline shift or brain swelling at this time. Superimposed chronic ischemia in the brain, and chronic microvascular ischemic change. Electronically Signed   By: Staci Righter M.D.   On: 08/19/2018 15:04   Mr Jodene Nam Head/brain VO Cm  Result Date: 08/19/2018 CLINICAL DATA:  Fall x2. This occurred yesterday. Last seen normal at 9 p.m. 08/18/2018. Slurred speech and LEFT-sided weakness are noted upon awakening today. EXAM: MRI HEAD WITHOUT CONTRAST MRA HEAD WITHOUT CONTRAST TECHNIQUE: Multiplanar, multiecho pulse sequences of the brain and surrounding structures were obtained without intravenous contrast. Angiographic images of the head were obtained using MRA technique without contrast. COMPARISON:  CT  code stroke 08/19/2018.  CT head 01/01/2016. FINDINGS: Motion degraded exam. MRI HEAD FINDINGS Brain: Large area of restricted diffusion corresponding low ADC, affecting the RIGHT MCA territory as predicted from CT, representing acute RIGHT MCA posterior division M2 thrombosis with acute infarction. This affects the frontal, temporal, parietal, and insular  lobes, as well as adjacent white matter. There may be posterior limb internal capsule involvement, but the lentiform nucleus is largely spared. No hemorrhage, mass lesion, hydrocephalus, or extra-axial fluid. Generalized atrophy. Hypoattenuation of white matter, likely small vessel disease. Chronic deep white matter lacunar infarcts most notable RIGHT frontal subcortical white matter adjacent to caudate, also chronic LEFT and RIGHT parietal cortical infarcts. BILATERAL cerebellar infarcts also noted of a chronic nature. Vascular: Reported separately Skull and upper cervical spine: Normal marrow Sinuses/Orbits: No significant opacity or layering fluid. BILATERAL cataract extraction. Other: None MRA HEAD FINDINGS Motion degraded exam.  Small or subtle lesions could be overlooked. Gross patency of the internal carotid arteries bilaterally without visible flow reducing stenosis. Diffusely narrowed LEFT A1 ACA, with dominant RIGHT anterior cerebral artery largely supplying both A2 and A3 segments. Unremarkable LEFT M1 MCA and proximal M2 segments. Proximal RIGHT M1 MCA is patent, but there is some loss of signal in its distal aspect. The anterior division of the M2 MCA is patent, but there is no posterior division flow related enhancement observed consistent with thrombosis contributing to the acute pattern of infarction. The basilar artery is widely patent. Artifactual misregistration in its midportion. Both vertebrals supply the basilar. Both PCA segments are unremarkable proximally. Cerebellar branches poorly visualized. No definite saccular aneurysm. IMPRESSION:  Large, nonhemorrhagic, acute infarction involving RIGHT MCA posterior M2 division territory, with corresponding thrombosis of that vessel on MRA. No significant midline shift or brain swelling at this time. Superimposed chronic ischemia in the brain, and chronic microvascular ischemic change. Electronically Signed   By: Staci Righter M.D.   On: 08/19/2018 15:04   Ct Head Code Stroke Wo Contrast  Result Date: 08/19/2018 CLINICAL DATA:  Code stroke. 84 year old female with left side weakness. Last seen well 2130 hours yesterday. EXAM: CT HEAD WITHOUT CONTRAST TECHNIQUE: Contiguous axial images were obtained from the base of the skull through the vertex without intravenous contrast. COMPARISON:  Head CT 01/01/2016 and earlier. FINDINGS: Brain: Confluent cytotoxic edema encompassing an area of 8 centimeters in the right hemisphere corresponding to the posterior right MCA territory. Posterior right insula and temporal lobe involvement. There is some superimposed chronic encephalomalacia underlying the acute ischemia as seen in 2017. There is no associated hemorrhage or significant mass effect. Superimposed much smaller area of cortical and subcortical white matter hypodensity in the contralateral posterior left MCA territory best seen on series 2, image 16. This is new since 2017. Chronic but increased hypodensity in the bilateral corona radiata and left thalamus since 2017. Chronic cerebellar infarcts appear stable and greater in the right hemisphere. Stable brainstem. No midline shift, ventriculomegaly. Vascular: Calcified atherosclerosis at the skull base. No suspicious intracranial vascular hyperdensity. Skull: No acute osseous abnormality identified. Hyperostosis of the calvarium, normal variant. Sinuses/Orbits: Chronic sphenoid mucoperiosteal thickening. Mild left sphenoid mucosal thickening and bubbly opacity is chronic but increased. Tympanic cavities and mastoids remain clear. Other: No acute orbit or scalp  soft tissue findings. ASPECTS [facility] Stroke Program Early CT Score) - Ganglionic level infarction (caudate, lentiform nuclei, internal capsule, insula, M1-M3 cortex): 4 - Supraganglionic infarction (M4-M6 cortex): 1 Total score (0-10 with 10 being normal): 5 (in the right hemisphere). IMPRESSION: 1. Acute on chronic posterior Right MCA territory infarct. No hemorrhage or mass effect. ASPECTS 5. 2. Superimposed age indeterminate much smaller posterior Left MCA territory ischemia (series 2, image 16), new since 2017. 3. Underlying chronic small vessel disease and chronic bilateral cerebellar infarcts. 4. Study discussed by telephone with Dr.  DAVID YELVERTON on 08/19/2018 at 10:06 . Electronically Signed   By: Genevie Ann M.D.   On: 08/19/2018 10:07   Vas US Carotid  Result Date: 08/20/2018 Carotid Arterial Duplex Study Indications: CVA. Performing Technologist: Abram Sander  Examination Guidelines: A complete evaluation includes B-mode imaging, spectral Doppler, color Doppler, and power Doppler as needed of all accessible portions of each vessel. Bilateral testing is considered an integral part of a complete examination. Limited examinations for reoccurring indications may be performed as noted.  Right Carotid Findings: +----------+--------+--------+--------+-----------+--------+           PSV cm/sEDV cm/sStenosisDescribe   Comments +----------+--------+--------+--------+-----------+--------+ CCA Prox  76      6               homogeneous         +----------+--------+--------+--------+-----------+--------+ CCA Distal58      11              homogeneous         +----------+--------+--------+--------+-----------+--------+ ICA Prox  101     14      1-39%   homogeneous         +----------+--------+--------+--------+-----------+--------+ ICA Distal63      11                                  +----------+--------+--------+--------+-----------+--------+ ECA       117                                          +----------+--------+--------+--------+-----------+--------+ +----------+--------+-------+--------+-------------------+           PSV cm/sEDV cmsDescribeArm Pressure (mmHG) +----------+--------+-------+--------+-------------------+ Subclavian89                                         +----------+--------+-------+--------+-------------------+ +---------+--------+--+--------+-+---------+ VertebralPSV cm/s48EDV cm/s9Antegrade +---------+--------+--+--------+-+---------+  Left Carotid Findings: +----------+--------+--------+--------+-----------+--------+           PSV cm/sEDV cm/sStenosisDescribe   Comments +----------+--------+--------+--------+-----------+--------+ CCA Prox  80      8               homogeneous         +----------+--------+--------+--------+-----------+--------+ CCA Distal89      14              homogeneous         +----------+--------+--------+--------+-----------+--------+ ICA Prox  85      23      1-39%   homogeneous         +----------+--------+--------+--------+-----------+--------+ ICA Distal66      21                                  +----------+--------+--------+--------+-----------+--------+ ECA       115                                         +----------+--------+--------+--------+-----------+--------+ +----------+--------+--------+--------+-------------------+ SubclavianPSV cm/sEDV cm/sDescribeArm Pressure (mmHG) +----------+--------+--------+--------+-------------------+           119                                         +----------+--------+--------+--------+-------------------+ +---------+--------+--+--------+--+---------+  VertebralPSV cm/s43EDV cm/s12Antegrade +---------+--------+--+--------+--+---------+  Summary: Right Carotid: Velocities in the right ICA are consistent with a 1-39% stenosis. Left Carotid: Velocities in the left ICA are consistent with a 1-39% stenosis.  Vertebrals:  Bilateral vertebral arteries demonstrate antegrade flow. Subclavians: Normal flow hemodynamics were seen in bilateral subclavian              arteries. *See table(s) above for measurements and observations.  Electronically signed by Antony Contras MD on 08/20/2018 at 2:03:49 PM.    Final     12-lead ECG sinus rhythm, 1 EKG with likely long RP tachycardia, cannot completely rule out atrial flutter (EKG from 10/31 at 9:38AM) (personally reviewed) All prior EKG's in EPIC reviewed with no documented atrial fibrillation  Telemetry sinus rhythm with intermittent long RP tachycardia (personally reviewed)  Assessment and Plan:  1. Cryptogenic stroke The patient presents with cryptogenic stroke.  The patient has a TEE planned for this AM.  I spoke at length with the patient about monitoring for afib with an implantable loop recorder.  Risks, benefits, and alteratives to implantable loop recorder were discussed with the patient today.   At this time, the patient is very clear in their decision to proceed with implantable loop recorder.   EKG from 08/19/18 at 9:38AM - cannot completely rule out atrial flutter - will have Dr Lovena Le review prior to ILR implant.   Wound care was reviewed with the patient (keep incision clean and dry for 3 days).  Wound check scheduled and entered in AVS.  Please call with questions.   Chanetta Marshall, NP 08/23/2018 9:25 AM  EP Attending  Patient seen and examined. Agree with the findings as noted above. The patient has had a cryptogenic stroke. I have discussed the indications for insertion of a loop recorder. Her TEE did not show [place] the etiology of her stroke. She will undergo insertion of an ILR. I have discussed the risks/benefits/goals/expectations and she wishes to proceed.  Mikle Bosworth.D.

## 2018-08-23 NOTE — Progress Notes (Signed)
Inpatient Rehabilitation Admissions Coordinator  I met with patient, her son, and a daughter at bedside. We discussed goals and expectations of an Inpt rehab admit. They are questionsoing if they would like patient to receive her rehab at SNF closer to home in [place]. I discussed the benefits of an inpt rehab admit. They will discuss with their sister this afternoon. Son requests SW to talk with them also this afternoon of their SNF options. I have alerted RN CM of need and she will relay to the SW. I await insurance decision as well as family preference for rehab at this time.    , RN, MSN Rehab Admissions Coordinator [phone] 08/23/2018 1:54 PM  

## 2018-08-23 NOTE — Interval H&P Note (Signed)
History and Physical Interval Note:  08/23/2018 12:09 PM  Rebecca Hardy  has presented today for surgery, with the diagnosis of stroke  The various methods of treatment have been discussed with the patient and family. After consideration of risks, benefits and other options for treatment, the patient has consented to  Procedure(s): LOOP RECORDER INSERTION (N/A) as a surgical intervention .  The patient's history has been reviewed, patient examined, no change in status, stable for surgery.  I have reviewed the patient's chart and labs.  Questions were answered to the patient's satisfaction.     Cristopher Peru

## 2018-08-23 NOTE — Anesthesia Preprocedure Evaluation (Addendum)
Anesthesia Evaluation  Patient identified by MRN, date of birth, ID band Patient awake    Reviewed: Allergy & Precautions, NPO status , Patient's Chart, lab work & pertinent test results  History of Anesthesia Complications Negative for: history of anesthetic complications  Airway Mallampati: II  TM Distance: >3 FB Neck ROM: Full    Dental no notable dental hx. (+) Teeth Intact   Pulmonary neg pulmonary ROS,    Pulmonary exam normal breath sounds clear to auscultation       Cardiovascular hypertension, Pt. on medications negative cardio ROS Normal cardiovascular exam Rhythm:Regular Rate:Normal     Neuro/Psych TIACVA, Residual Symptoms    GI/Hepatic Neg liver ROS, GERD  Controlled,  Endo/Other  negative endocrine ROSdiabetes, Type 2, Oral Hypoglycemic Agents  Renal/GU Renal InsufficiencyRenal disease     Musculoskeletal negative musculoskeletal ROS (+)   Abdominal   Peds  Hematology negative hematology ROS (+)   Anesthesia Other Findings Day of surgery medications reviewed with the patient.  Reproductive/Obstetrics                            Anesthesia Physical Anesthesia Plan  ASA: III  Anesthesia Plan: MAC   Post-op Pain Management:    Induction:   PONV Risk Score and Plan: 2 and Treatment may vary due to age or medical condition and Propofol infusion  Airway Management Planned: Nasal Cannula and Natural Airway  Additional Equipment:   Intra-op Plan:   Post-operative Plan:   Informed Consent: I have reviewed the patients History and Physical, chart, labs and discussed the procedure including the risks, benefits and alternatives for the proposed anesthesia with the patient or authorized representative who has indicated his/her understanding and acceptance.     Plan Discussed with: CRNA  Anesthesia Plan Comments:        Anesthesia Quick Evaluation

## 2018-08-23 NOTE — Transfer of Care (Signed)
Immediate Anesthesia Transfer of Care Note  Patient: Rebecca Hardy  Procedure(s) Performed: TRANSESOPHAGEAL ECHOCARDIOGRAM (TEE) (N/A )  Patient Location: Endoscopy Unit  Anesthesia Type:MAC  Level of Consciousness: awake, alert  and oriented  Airway & Oxygen Therapy: Patient Spontanous Breathing and Patient connected to nasal cannula oxygen  Post-op Assessment: Report given to RN, Post -op Vital signs reviewed and stable and Patient moving all extremities X 4  Post vital signs: Reviewed and stable  Last Vitals:  Vitals Value Taken Time  BP    Temp    Pulse    Resp    SpO2      Last Pain:  Vitals:   08/23/18 1004  TempSrc: Oral  PainSc: 0-No pain         Complications: No apparent anesthesia complications

## 2018-08-23 NOTE — Progress Notes (Signed)
Patient back from procedure Alert and oriented X 4  Denies pain

## 2018-08-23 NOTE — CV Procedure (Signed)
   Transesophageal Echocardiogram Note  NYOKA ALCOSER [id number] [birth year]  Procedure: Transesophageal Echocardiogram Indications: Stroke  Procedure Details Consent: Obtained Time Out: Verified patient identification, verified procedure, site/side was marked, verified correct patient position, special equipment/implants available, Radiology Safety Procedures followed,  medications/allergies/relevent history reviewed, required imaging and test results available.  Performed  Medications: During this procedure the patient is administered IV Propofol by anesthesia staff to achieve and maintain moderate conscious sedation.  The patient's heart rate, blood pressure, and oxygen saturation are monitored continuously during the procedure. The period of conscious sedation is 30 minutes, of which I was present face-to-face 100% of this time.  No intracardiac source of emboli, negative bubble study.  Complications: No apparent complications Patient did tolerate procedure well.  Ena Dawley, MD, [facility] 08/23/2018, 11:22 AM

## 2018-08-23 NOTE — Progress Notes (Signed)
STROKE TEAM PROGRESS NOTE  INTERVAL HISTORY Her daughter and son are at the bedside.  She is sitting in bed for lunch, no acute event overnight. Had TEE unremarkable and loop placed. Pt and family is thinking about [place] trial. PT/OT recommend CIR.  Vitals:   08/23/18 1140 08/23/18 1150 08/23/18 1200 08/23/18 1210  BP: (!) 122/40 112/85 (!) 128/35 (!) 123/26  Pulse: 60 63 63 63  Resp: 16 17 17 13   Temp:      TempSrc:      SpO2: 96% 100% 99% 98%  Weight:      Height:        CBC:  Recent Labs  Lab 08/19/18 0935 08/19/18 0946  WBC 11.1*  --   NEUTROABS 6.1  --   HGB 13.0 13.3  HCT 39.5 39.0  MCV 88.6  --   PLT 155  --     Basic Metabolic Panel:  Recent Labs  Lab 08/19/18 0935 08/19/18 0946  NA 132* 133*  K 3.7 3.8  CL 98 98  CO2 24  --   GLUCOSE 190* 185*  BUN 29* 29*  CREATININE 1.63* 1.60*  CALCIUM 9.2  --    Lipid Panel:     Component Value Date/Time   CHOL 94 08/20/2018 0955   TRIG 82 08/20/2018 0955   HDL 36 (L) 08/20/2018 0955   CHOLHDL 2.6 08/20/2018 0955   VLDL 16 08/20/2018 0955   LDLCALC 42 08/20/2018 0955   HgbA1c:  Lab Results  Component Value Date   HGBA1C 11.6 (H) 08/20/2018   Urine Drug Screen:     Component Value Date/Time   LABOPIA NONE DETECTED 08/19/2018 0935   COCAINSCRNUR NONE DETECTED 08/19/2018 0935   LABBENZ NONE DETECTED 08/19/2018 0935   AMPHETMU NONE DETECTED 08/19/2018 0935   THCU NONE DETECTED 08/19/2018 0935   LABBARB NONE DETECTED 08/19/2018 0935    Alcohol Level     Component Value Date/Time   ETH <10 08/19/2018 0935    IMAGING  Mr Jodene Nam Head/brain Wo Cm 08/19/2018 IMPRESSION:  Large, nonhemorrhagic, acute infarction involving RIGHT MCA posterior M2 division territory, with corresponding thrombosis of that vessel on MRA. No significant midline shift or brain swelling at this time. Superimposed chronic ischemia in the brain, and chronic microvascular ischemic change.    Ct Head Code Stroke Wo  Contrast 08/19/2018 IMPRESSION:  1. Acute on chronic posterior Right MCA territory infarct. No hemorrhage or mass effect. ASPECTS 5.  2. Superimposed age indeterminate much smaller posterior Left MCA territory ischemia (series 2, image 16), new since 2017.  3. Underlying chronic small vessel disease and chronic bilateral cerebellar infarcts.    2D Echocardiogram   - Left ventricle: The cavity size was normal. Wall thickness was increased in a pattern of moderate LVH. Systolic function was normal. The estimated ejection fraction was in the range of 50% to 55%. Wall motion was normal; there were no regional wall motion abnormalities. Doppler parameters are consistent with abnormal left ventricular relaxation (grade 1 diastolic dysfunction). Doppler parameters are consistent with high ventricular filling pressure. - Aortic valve: There was mild regurgitation. Valve area (VTI): 2.27 cm^2. Valve area (Vmax): 2.01 cm^2. Valve area (Vmean): 2.14 cm^2. - Atrial septum: No defect or patent foramen ovale was identified.   Carotid Doppler   There is 1-39% bilateral ICA stenosis. Vertebral artery flow is antegrade.     PHYSICAL EXAM Pleasant frail elderly Caucasian lady currently not in distress. Afebrile. Head is nontraumatic. Neck is supple without bruit.  Cardiac exam no murmur or gallop. Lungs are clear to auscultation. Distal pulses are well felt.  Neurological Exam :   awake alert oriented 2 initially with not orientation to time but later AAOx3. Follows simple midline and one and few two-step commands. No dysarthria or aphasia. Extraocular movements are full range. Mild saccadic dysmetria to left. Left homonymous hemianopsia. Mild left facial droop. Tongue midline. Motor system exam reveals symmetric upper and lower distended without focal weakness. Orbits right over left upper extremity. Sensation appears intact and no ataxia. DTR 1+ and no babinski. Gait not tested.  Exam not changed from  yesterday   ASSESSMENT/PLAN Ms. Rebecca Hardy is a 84 y.o. female with history of HTN, DM, HLD, CKD stage 3, prior stroke presenting to [facility] 10/31 with slurred speech and L HP. CT showed a subacute infarct so not a candidate for tPA. Transferred to [facility].   Stroke:  Large right MCA infarct embolic secondary to unknown source. Highly suspicious for AF  Code Stroke CT head acute on chronic R MCA infarct. Smaller L MCA infarct new since 2017. Old B cerebellar infarcts.    MRI / MRA  Large R MCA M2 territory infarct w/ corresponding thrombosis.   Carotid Doppler  B ICA 1-39% stenosis, VAs antegrade   2D Echo  EF 50-55%. No source of embolus   TEE unremarkable and no PFO  Loop placed  LDL 42  HgbA1c 11.6  Lovenox 40 mg sq daily for VTE prophylaxis  No antithrombotic prior to admission, now on aspirin 325 mg daily. Continue aspirin 325 mg daily on discharge.  Therapy recommendations:  CIR  Disposition:  pending   Hypertensive Urgency  BP Stable now . Gradually normalize BP during hospitalization . Long-term BP goal normotensive  Hyperlipidemia  Home meds:  crestor 20 and zetia 10  On simvastatin 40 in hospital  LDL 42, goal < 70  Ok to Continue home statin at discharge  Diabetes type II  HgbA1c 11.6, goal < 7.0  Uncontrolled  Hyperglycemia improving  SSI  CBG monitoring  PCP close follow up for better DM control  UTI  UA WBC > 50  Now off rocephin - completed 3 doses  Other Stroke Risk Factors  Advanced age  Hx stroke in 2009 but no details available  Other Active Problems  Hx breast cancer  CKD stage III - Cre 1.60  Pt is interested in [facility] day # 4  Neurology will sign off. Please call with questions. Pt will follow up with stroke clinic NP at [facility] in about 4 weeks. Thanks for the consult.   Rebecca Hawking, MD PhD Stroke Neurology 08/23/2018 2:15 PM     To contact Stroke Continuity provider, please refer to  [email or web address] After hours, contact General Neurology

## 2018-08-23 NOTE — H&P (View-Only) (Signed)
ELECTROPHYSIOLOGY CONSULT NOTE  Patient ID: Rebecca Hardy MRN: [id number], DOB/AGE: 07/12/1933   Admit date: 08/19/2018 Date of Consult: 08/23/2018  Primary Physician: Celene Squibb, MD Primary Cardiologist: new to [facility] Reason for Consultation: Cryptogenic stroke; recommendations regarding Implantable Loop Recorder  History of Present Illness EP has been asked to evaluate [name] for placement of an implantable loop recorder to monitor for atrial fibrillation by Dr Erlinda Hong.  The patient was admitted on 08/19/2018 with slurred speech and left hemiparesis.  Imaging demonstrated large right MCA infarct felt to be embolic 2/2 unknown source.  she has undergone workup for stroke including echocardiogram and carotid dopplers.  The patient has been monitored on telemetry which has demonstrated sinus rhythm with intermittent long RP tachycardia.  Inpatient stroke work-up is to be completed with a TEE.   Echocardiogram this admission demonstrated normal LVEF, normal LA size.  Lab work is reviewed.  Prior to admission, the patient denies chest pain, shortness of breath, dizziness, or syncope. She has had rare palpitations. They are recovering from their stroke with plans to go to rehab at discharge.    Past Medical History:  Diagnosis Date  . 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 Diabetes mellitus   . GERD (gastroesophageal reflux disease)   . High cholesterol   . History of TIA (transient ischemic attack)   . Hypertension   . Stroke [facility])      Surgical History:  Past Surgical History:  Procedure Laterality Date  . ABDOMINAL HYSTERECTOMY    . BREAST LUMPECTOMY    . CESAREAN SECTION       Medications Prior to Admission  Medication Sig Dispense Refill Last Dose  . ezetimibe (ZETIA) 10 MG tablet Take 10 mg by mouth daily.   0 08/18/2018 at Unknown time  . furosemide (LASIX) 20 MG tablet Take 40 mg by mouth daily.    08/18/2018 at Unknown time  .  GLIPIZIDE XL 5 MG 24 hr tablet Take 5 mg by mouth.   1 08/18/2018 at Unknown time  . lisinopril (PRINIVIL,ZESTRIL) 10 MG tablet Take 20 mg by mouth daily.    08/18/2018 at Unknown time  . potassium chloride (K-DUR) 10 MEQ tablet Take 10 mEq by mouth once.   4 08/18/2018 at Unknown time  . rosuvastatin (CRESTOR) 20 MG tablet Take 20 mg by mouth daily.   1 08/18/2018 at Unknown time  . sertraline (ZOLOFT) 25 MG tablet Take 25 mg by mouth at bedtime.   2 08/18/2018 at Unknown time  . TOUJEO SOLOSTAR 300 UNIT/ML SOPN Inject 50 Units into the skin 2 (two) times daily. 20 units in the morning and 8 units at bedtime  2 08/18/2018 at 0800am    Inpatient Medications:  . aspirin  300 mg Rectal Daily   Or  . aspirin  325 mg Oral Daily  . enoxaparin (LOVENOX) injection  40 mg Subcutaneous Q24H  . ezetimibe  10 mg Oral Daily  . insulin aspart  0-15 Units Subcutaneous TID WC  . insulin aspart  0-5 Units Subcutaneous QHS  . metoprolol tartrate  25 mg Oral BID  . sertraline  25 mg Oral Daily  . simvastatin  40 mg Oral q1800    Allergies:  Allergies  Allergen Reactions  . Codeine Other (See Comments)    Unknown reaction  . Penicillins Other (See Comments)    Unknown reaction  Has patient had a PCN reaction causing immediate  rash, facial/tongue/throat swelling, SOB or lightheadedness with hypotension: Uknown Has patient had a PCN reaction causing severe rash involving mucus membranes or skin necrosis: Uknown Has patient had a PCN reaction that required hospitalization: Unknown Has patient had a PCN reaction occurring within the last 10 years: No If all of the above answers are "NO", then may proceed with Cephalosporin use.    Social History   Socioeconomic History  . Marital status: Widowed    Spouse name: Not on file  . Number of children: Not on file  . Years of education: Not on file  . Highest education level: Not on file  Occupational History  . Not on file  Social Needs  .  Financial resource strain: Not on file  . Food insecurity:    Worry: Not on file    Inability: Not on file  . Transportation needs:    Medical: Not on file    Non-medical: Not on file  Tobacco Use  . Smoking status: Never Smoker  . Smokeless tobacco: Never Used  Substance and Sexual Activity  . Alcohol use: No  . Drug use: No  . Sexual activity: Not on file  Lifestyle  . Physical activity:    Days per week: Not on file    Minutes per session: Not on file  . Stress: Not on file  Relationships  . Social connections:    Talks on phone: Not on file    Gets together: Not on file    Attends religious service: Not on file    Active member of club or organization: Not on file    Attends meetings of clubs or organizations: Not on file    Relationship status: Not on file  . Intimate partner violence:    Fear of current or ex partner: Not on file    Emotionally abused: Not on file    Physically abused: Not on file    Forced sexual activity: Not on file  Other Topics Concern  . Not on file  Social History Narrative  . Not on file     Family History  Problem Relation Age of Onset  . Cancer Mother   . Hypertension Father       Review of Systems: All other systems reviewed and are otherwise negative except as noted above.  Physical Exam: Vitals:   08/22/18 2005 08/23/18 0005 08/23/18 0300 08/23/18 0737  BP:  99/80 123/62 (!) 118/42  Pulse: (!) 107 (!) 102 60 61  Resp: (!) 21 15  12   Temp:  98.4 F (36.9 C) 98.3 F (36.8 C) 97.7 F (36.5 C)  TempSrc:  Oral Oral Axillary  SpO2: 98% 95% 96% 96%  Weight:      Height:        GEN- The patient is elderly appearing, alert and oriented x 3 today.   Head- normocephalic, atraumatic Eyes-  Sclera clear, conjunctiva pink Ears- hearing intact Oropharynx- clear Neck- supple Lungs- Clear to ausculation bilaterally, normal work of breathing Heart- Regular rate and rhythm  GI- soft, NT, ND, + BS Extremities- no clubbing,  cyanosis, or edema MS- no significant deformity or atrophy Skin- no rash or lesion Psych- euthymic mood, full affect   Labs:   Lab Results  Component Value Date   WBC 11.1 (H) 08/19/2018   HGB 13.3 08/19/2018   HCT 39.0 08/19/2018   MCV 88.6 08/19/2018   PLT 155 08/19/2018    Recent Labs  Lab 08/19/18 0935 08/19/18 0946  NA 132*  133*  K 3.7 3.8  CL 98 98  CO2 24  --   BUN 29* 29*  CREATININE 1.63* 1.60*  CALCIUM 9.2  --   PROT 7.2  --   BILITOT 1.1  --   ALKPHOS 51  --   ALT 17  --   AST 35  --   GLUCOSE 190* 185*     Radiology/Studies: Mr Brain Wo Contrast  Result Date: 08/19/2018 CLINICAL DATA:  Fall x2. This occurred yesterday. Last seen normal at 9 p.m. 08/18/2018. Slurred speech and LEFT-sided weakness are noted upon awakening today. EXAM: MRI HEAD WITHOUT CONTRAST MRA HEAD WITHOUT CONTRAST TECHNIQUE: Multiplanar, multiecho pulse sequences of the brain and surrounding structures were obtained without intravenous contrast. Angiographic images of the head were obtained using MRA technique without contrast. COMPARISON:  CT code stroke 08/19/2018.  CT head 01/01/2016. FINDINGS: Motion degraded exam. MRI HEAD FINDINGS Brain: Large area of restricted diffusion corresponding low ADC, affecting the RIGHT MCA territory as predicted from CT, representing acute RIGHT MCA posterior division M2 thrombosis with acute infarction. This affects the frontal, temporal, parietal, and insular lobes, as well as adjacent white matter. There may be posterior limb internal capsule involvement, but the lentiform nucleus is largely spared. No hemorrhage, mass lesion, hydrocephalus, or extra-axial fluid. Generalized atrophy. Hypoattenuation of white matter, likely small vessel disease. Chronic deep white matter lacunar infarcts most notable RIGHT frontal subcortical white matter adjacent to caudate, also chronic LEFT and RIGHT parietal cortical infarcts. BILATERAL cerebellar infarcts also noted of a  chronic nature. Vascular: Reported separately Skull and upper cervical spine: Normal marrow Sinuses/Orbits: No significant opacity or layering fluid. BILATERAL cataract extraction. Other: None MRA HEAD FINDINGS Motion degraded exam.  Small or subtle lesions could be overlooked. Gross patency of the internal carotid arteries bilaterally without visible flow reducing stenosis. Diffusely narrowed LEFT A1 ACA, with dominant RIGHT anterior cerebral artery largely supplying both A2 and A3 segments. Unremarkable LEFT M1 MCA and proximal M2 segments. Proximal RIGHT M1 MCA is patent, but there is some loss of signal in its distal aspect. The anterior division of the M2 MCA is patent, but there is no posterior division flow related enhancement observed consistent with thrombosis contributing to the acute pattern of infarction. The basilar artery is widely patent. Artifactual misregistration in its midportion. Both vertebrals supply the basilar. Both PCA segments are unremarkable proximally. Cerebellar branches poorly visualized. No definite saccular aneurysm. IMPRESSION: Large, nonhemorrhagic, acute infarction involving RIGHT MCA posterior M2 division territory, with corresponding thrombosis of that vessel on MRA. No significant midline shift or brain swelling at this time. Superimposed chronic ischemia in the brain, and chronic microvascular ischemic change. Electronically Signed   By: Staci Righter M.D.   On: 08/19/2018 15:04   Mr Jodene Nam Head/brain TD Cm  Result Date: 08/19/2018 CLINICAL DATA:  Fall x2. This occurred yesterday. Last seen normal at 9 p.m. 08/18/2018. Slurred speech and LEFT-sided weakness are noted upon awakening today. EXAM: MRI HEAD WITHOUT CONTRAST MRA HEAD WITHOUT CONTRAST TECHNIQUE: Multiplanar, multiecho pulse sequences of the brain and surrounding structures were obtained without intravenous contrast. Angiographic images of the head were obtained using MRA technique without contrast. COMPARISON:  CT  code stroke 08/19/2018.  CT head 01/01/2016. FINDINGS: Motion degraded exam. MRI HEAD FINDINGS Brain: Large area of restricted diffusion corresponding low ADC, affecting the RIGHT MCA territory as predicted from CT, representing acute RIGHT MCA posterior division M2 thrombosis with acute infarction. This affects the frontal, temporal, parietal, and insular  lobes, as well as adjacent white matter. There may be posterior limb internal capsule involvement, but the lentiform nucleus is largely spared. No hemorrhage, mass lesion, hydrocephalus, or extra-axial fluid. Generalized atrophy. Hypoattenuation of white matter, likely small vessel disease. Chronic deep white matter lacunar infarcts most notable RIGHT frontal subcortical white matter adjacent to caudate, also chronic LEFT and RIGHT parietal cortical infarcts. BILATERAL cerebellar infarcts also noted of a chronic nature. Vascular: Reported separately Skull and upper cervical spine: Normal marrow Sinuses/Orbits: No significant opacity or layering fluid. BILATERAL cataract extraction. Other: None MRA HEAD FINDINGS Motion degraded exam.  Small or subtle lesions could be overlooked. Gross patency of the internal carotid arteries bilaterally without visible flow reducing stenosis. Diffusely narrowed LEFT A1 ACA, with dominant RIGHT anterior cerebral artery largely supplying both A2 and A3 segments. Unremarkable LEFT M1 MCA and proximal M2 segments. Proximal RIGHT M1 MCA is patent, but there is some loss of signal in its distal aspect. The anterior division of the M2 MCA is patent, but there is no posterior division flow related enhancement observed consistent with thrombosis contributing to the acute pattern of infarction. The basilar artery is widely patent. Artifactual misregistration in its midportion. Both vertebrals supply the basilar. Both PCA segments are unremarkable proximally. Cerebellar branches poorly visualized. No definite saccular aneurysm. IMPRESSION:  Large, nonhemorrhagic, acute infarction involving RIGHT MCA posterior M2 division territory, with corresponding thrombosis of that vessel on MRA. No significant midline shift or brain swelling at this time. Superimposed chronic ischemia in the brain, and chronic microvascular ischemic change. Electronically Signed   By: Staci Righter M.D.   On: 08/19/2018 15:04   Ct Head Code Stroke Wo Contrast  Result Date: 08/19/2018 CLINICAL DATA:  Code stroke. 84 year old female with left side weakness. Last seen well 2130 hours yesterday. EXAM: CT HEAD WITHOUT CONTRAST TECHNIQUE: Contiguous axial images were obtained from the base of the skull through the vertex without intravenous contrast. COMPARISON:  Head CT 01/01/2016 and earlier. FINDINGS: Brain: Confluent cytotoxic edema encompassing an area of 8 centimeters in the right hemisphere corresponding to the posterior right MCA territory. Posterior right insula and temporal lobe involvement. There is some superimposed chronic encephalomalacia underlying the acute ischemia as seen in 2017. There is no associated hemorrhage or significant mass effect. Superimposed much smaller area of cortical and subcortical white matter hypodensity in the contralateral posterior left MCA territory best seen on series 2, image 16. This is new since 2017. Chronic but increased hypodensity in the bilateral corona radiata and left thalamus since 2017. Chronic cerebellar infarcts appear stable and greater in the right hemisphere. Stable brainstem. No midline shift, ventriculomegaly. Vascular: Calcified atherosclerosis at the skull base. No suspicious intracranial vascular hyperdensity. Skull: No acute osseous abnormality identified. Hyperostosis of the calvarium, normal variant. Sinuses/Orbits: Chronic sphenoid mucoperiosteal thickening. Mild left sphenoid mucosal thickening and bubbly opacity is chronic but increased. Tympanic cavities and mastoids remain clear. Other: No acute orbit or scalp  soft tissue findings. ASPECTS [facility] Stroke Program Early CT Score) - Ganglionic level infarction (caudate, lentiform nuclei, internal capsule, insula, M1-M3 cortex): 4 - Supraganglionic infarction (M4-M6 cortex): 1 Total score (0-10 with 10 being normal): 5 (in the right hemisphere). IMPRESSION: 1. Acute on chronic posterior Right MCA territory infarct. No hemorrhage or mass effect. ASPECTS 5. 2. Superimposed age indeterminate much smaller posterior Left MCA territory ischemia (series 2, image 16), new since 2017. 3. Underlying chronic small vessel disease and chronic bilateral cerebellar infarcts. 4. Study discussed by telephone with Dr.  DAVID YELVERTON on 08/19/2018 at 10:06 . Electronically Signed   By: Genevie Ann M.D.   On: 08/19/2018 10:07   Vas US Carotid  Result Date: 08/20/2018 Carotid Arterial Duplex Study Indications: CVA. Performing Technologist: Abram Sander  Examination Guidelines: A complete evaluation includes B-mode imaging, spectral Doppler, color Doppler, and power Doppler as needed of all accessible portions of each vessel. Bilateral testing is considered an integral part of a complete examination. Limited examinations for reoccurring indications may be performed as noted.  Right Carotid Findings: +----------+--------+--------+--------+-----------+--------+           PSV cm/sEDV cm/sStenosisDescribe   Comments +----------+--------+--------+--------+-----------+--------+ CCA Prox  76      6               homogeneous         +----------+--------+--------+--------+-----------+--------+ CCA Distal58      11              homogeneous         +----------+--------+--------+--------+-----------+--------+ ICA Prox  101     14      1-39%   homogeneous         +----------+--------+--------+--------+-----------+--------+ ICA Distal63      11                                  +----------+--------+--------+--------+-----------+--------+ ECA       117                                          +----------+--------+--------+--------+-----------+--------+ +----------+--------+-------+--------+-------------------+           PSV cm/sEDV cmsDescribeArm Pressure (mmHG) +----------+--------+-------+--------+-------------------+ Subclavian89                                         +----------+--------+-------+--------+-------------------+ +---------+--------+--+--------+-+---------+ VertebralPSV cm/s48EDV cm/s9Antegrade +---------+--------+--+--------+-+---------+  Left Carotid Findings: +----------+--------+--------+--------+-----------+--------+           PSV cm/sEDV cm/sStenosisDescribe   Comments +----------+--------+--------+--------+-----------+--------+ CCA Prox  80      8               homogeneous         +----------+--------+--------+--------+-----------+--------+ CCA Distal89      14              homogeneous         +----------+--------+--------+--------+-----------+--------+ ICA Prox  85      23      1-39%   homogeneous         +----------+--------+--------+--------+-----------+--------+ ICA Distal66      21                                  +----------+--------+--------+--------+-----------+--------+ ECA       115                                         +----------+--------+--------+--------+-----------+--------+ +----------+--------+--------+--------+-------------------+ SubclavianPSV cm/sEDV cm/sDescribeArm Pressure (mmHG) +----------+--------+--------+--------+-------------------+           119                                         +----------+--------+--------+--------+-------------------+ +---------+--------+--+--------+--+---------+  VertebralPSV cm/s43EDV cm/s12Antegrade +---------+--------+--+--------+--+---------+  Summary: Right Carotid: Velocities in the right ICA are consistent with a 1-39% stenosis. Left Carotid: Velocities in the left ICA are consistent with a 1-39% stenosis.  Vertebrals:  Bilateral vertebral arteries demonstrate antegrade flow. Subclavians: Normal flow hemodynamics were seen in bilateral subclavian              arteries. *See table(s) above for measurements and observations.  Electronically signed by Antony Contras MD on 08/20/2018 at 2:03:49 PM.    Final     12-lead ECG sinus rhythm, 1 EKG with likely long RP tachycardia, cannot completely rule out atrial flutter (EKG from 10/31 at 9:38AM) (personally reviewed) All prior EKG's in EPIC reviewed with no documented atrial fibrillation  Telemetry sinus rhythm with intermittent long RP tachycardia (personally reviewed)  Assessment and Plan:  1. Cryptogenic stroke The patient presents with cryptogenic stroke.  The patient has a TEE planned for this AM.  I spoke at length with the patient about monitoring for afib with an implantable loop recorder.  Risks, benefits, and alteratives to implantable loop recorder were discussed with the patient today.   At this time, the patient is very clear in their decision to proceed with implantable loop recorder.   EKG from 08/19/18 at 9:38AM - cannot completely rule out atrial flutter - will have Dr Lovena Le review prior to ILR implant.   Wound care was reviewed with the patient (keep incision clean and dry for 3 days).  Wound check scheduled and entered in AVS.  Please call with questions.   Chanetta Marshall, NP 08/23/2018 9:25 AM  EP Attending  Patient seen and examined. Agree with the findings as noted above. The patient has had a cryptogenic stroke. I have discussed the indications for insertion of a loop recorder. Her TEE did not show [place] the etiology of her stroke. She will undergo insertion of an ILR. I have discussed the risks/benefits/goals/expectations and she wishes to proceed.  Mikle Bosworth.D.

## 2018-08-23 NOTE — Progress Notes (Signed)
PT Cancellation Note  Patient Details Name: SHANTAYA BLUESTONE MRN: [id number] DOB: [birth year]   Cancelled Treatment:    Reason Eval/Treat Not Completed: Patient at procedure or test/unavailable   Will follow,   Roney Marion, PT  Acute Rehabilitation Services Pager [phone] Office [phone]    [place] 08/23/2018, 11:23 AM

## 2018-08-24 LAB — GLUCOSE, CAPILLARY
GLUCOSE-CAPILLARY: 160 mg/dL — AB (ref 70–99)
Glucose-Capillary: 168 mg/dL — ABNORMAL HIGH (ref 70–99)

## 2018-08-24 MED ORDER — METOPROLOL TARTRATE 50 MG PO TABS
50.0000 mg | ORAL_TABLET | Freq: Two times a day (BID) | ORAL | Status: DC
  Administered 2018-08-24: 50 mg via ORAL
  Filled 2018-08-24: qty 1

## 2018-08-24 MED ORDER — ASPIRIN 325 MG PO TABS: 325.0000 mg | ORAL_TABLET | Freq: Every day | ORAL | Status: DC

## 2018-08-24 MED ORDER — METOPROLOL TARTRATE 50 MG PO TABS: 50.0000 mg | ORAL_TABLET | Freq: Two times a day (BID) | ORAL | 0 refills | Status: DC

## 2018-08-24 NOTE — Progress Notes (Signed)
Inpatient Rehabilitation Admissions Coordinator  Notified by RN CM that pt and family have decided to d/c home. I will contact [employer] and withdraw my request for CIR admit.  Danne Baxter, RN, MSN Rehab Admissions Coordinator [phone] 08/24/2018 10:11 AM

## 2018-08-24 NOTE — Care Management Important Message (Signed)
Important Message  Patient Details  Name: LY WASS MRN: [id number] Date of Birth: 1933/07/22   Medicare Important Message Given:  Yes    Tosh Glaze Montine Circle 08/24/2018, 2:56 PM

## 2018-08-24 NOTE — Care Management Note (Addendum)
Case Management Note  Patient Details  Name: Rebecca Hardy MRN: [id number] Date of Birth: 11/04/1932  Subjective/Objective:   Pt admitted with a stroke. She is from home alone. Pt has walker at home.  No issues with obtaining her medications and no issues with transportation.                    Action/Plan: Recommendations are for CIR. Pt and family have decided to d/c home. Family states they are able to provide 24 hour supervision. Family requesting a hospital bed. CM notified MD. Butch Penny with [facility] DME notified and will have bed delivered to the home. Pt with orders for [facility] services. CM provided the family choice and they selected [facility]. Butch Penny with [facility] made aware and accepted the referral. Contact is Shirlean Mylar: [phone] Pts family will provide transport home.  CM updated CIR and CSW of decision to d/c home.   Expected Discharge Date:  08/24/18               Expected Discharge Plan:  [place]  In-House Referral:     Discharge planning Services  CM Consult  Post Acute Care Choice:  Durable Medical Equipment, Home Health Choice offered to:  Patient, Adult Children  DME Arranged:  Hospital bed DME Agency:  [place] Arranged:  PT, OT, Nurse's Aide [facility] Agency:  [place]  Status of Service:  Completed, signed off  If discussed at [place] of Stay Meetings, dates discussed:    Additional Comments:  Pollie Friar, RN 08/24/2018, 10:44 AM

## 2018-08-24 NOTE — Progress Notes (Addendum)
Swat Nurse went over discharge with patient and family . Patient and family verbalized understanding of discharge. All questions and concern addressed. Discharging home with all belongs. Taken  down in a wheelchair.

## 2018-08-24 NOTE — Discharge Summary (Signed)
Physician Discharge Summary  KAZOUA GOSSEN ZMO:[id number] DOB: 01-Aug-1933 DOA: 08/19/2018  PCP: Celene Squibb, MD  Admit date: 08/19/2018 Discharge date: 08/24/2018  Recommendations for Outpatient Follow-up:   Large acute right MCA ischemic infarct --f/u with neurology 4 weeks  Moderate to severe mitral regurgitation --astymptomatic. F/u with cardiology as an outpatient suggested  Narrow complex tachycardia,  --Thought to be asymptomatic AVNRT episodes.  In and out of SR. Started on metoprolol per cardiology --s/p loop recorder 11/4 --Continue metoprolol. --f/u with EP as an outpt  DM type 2.  Hemoglobin A1c 11.6. --f/u closely as an outpt    Follow-up Information    [place] [facility] Follow up on 09/02/2018.   Specialty:  Cardiology Why:  at [facility] information: [address], [place] [phone]       [facility]. Schedule an appointment as soon as possible for a visit in 4 day(s).   Specialty:  Neurology Contact information: [address] [place] [phone]       Nigel Mormon, MD. Schedule an appointment as soon as possible for a visit in 2 week(s).   Specialty:  Cardiology Contact information: [place] [address] [phone]            Discharge Diagnoses:  1. PRINCIPAL Large acute right MCA ischemic infarct 2. Moderate to severe mitral regurgitation 3. Narrow complex tachycardia,  4. Hyperlipidemia 5. DM type 2 6. CKD stage III  Discharge Condition: improved Disposition: home (pt refused CIR and SNF)  Diet recommendation: heart healhty, diabetic diet  Filed Weights   08/19/18 0928  Weight: 82.6 kg    History of present illness:  84 year old woman presented with slurred speech, left-sided weakness, left-sided facial droop.  CT showed large right MCA distribution infarct.  Not a candidate  for TPA since site of window  Hospital Course:  Patient was admitted, seen by neurology, started on aspirin, continued on statin therapy.  Neurologic condition rapidly improved.  She was seen by therapy with recommendations for CIR, ultimately the family declined this and also declined skilled nursing facility placement and desire to take the patient home with 24-hour care and home health.  Hospital bed is being arranged.  Neurology also recommended TEE and loop recorder given the patient's narrow complex tachycardia intermittently.  She was seen by cardiology and started on metoprolol for this and will follow-up with neurology, cardiology and electrophysiology as an outpatient.  Individual issues as below.  Large acute right MCA ischemic infarct.  LDL 42.  EKG shows apparent junctional tachycardia, inferior infarct, prolonged QT.   --s/p TEE 11/4 no evidence of clot or PFO --not on antiplt beforehand. Now on aspirin 325, to continue on d/c per neurology. Continue statin.  --therapy recommended CIR --f/u with neurology 4 weeks  Moderate to severe mitral regurgitation --astymptomatic. F/u with cardiology as an outpatient suggested, will defer to PCP  Narrow complex tachycardia,  --Thought to be asymptomatic AVNRT episodes.  In and out of SR. Started on metoprolol per cardiology --s/p loop recorder 11/4 --Continue metoprolol. --f/u with EP and cardiology as an outpt  Hyperlipidemia --Continue statin  DM type 2.  Hemoglobin A1c 11.6. --CBG stable.  resume oral agents on discharge.  --f/u closely as an outpt   CKD stage III --Has been stable  Consultants:  Neurology   Cardiology   EP  Procedures:  Echo Study Conclusions  - Left ventricle:  The cavity size was normal. Wall thickness was increased in a pattern of moderate LVH. Systolic function was normal. The estimated ejection fraction was in the range of 50% to 55%. Wall motion was normal; there were no regional  wall motion abnormalities. Doppler parameters are consistent with abnormal left ventricular relaxation (grade 1 diastolic dysfunction). Doppler parameters are consistent with high ventricular filling pressure. - Aortic valve: There was mild regurgitation. Valve area (VTI): 2.27 cm^2. Valve area (Vmax): 2.01 cm^2. Valve area (Vmean): 2.14 cm^2. - Atrial septum: No defect or patent foramen ovale was identified.   TEE Study Conclusions  - Left ventricle: There was mild concentric hypertrophy. Systolic   function was normal. The estimated ejection fraction was in the   range of 55% to 60%. Wall motion was normal; there were no   regional wall motion abnormalities. - Aortic valve: There was mild regurgitation directed centrally in   the LVOT. - Mitral valve: There was moderate to severe regurgitation directed   centrally. - Left atrium: No evidence of thrombus in the atrial cavity or   appendage. No evidence of thrombus in the atrial cavity or   appendage. No evidence of thrombus in the appendage. - Right atrium: No evidence of thrombus in the atrial cavity or   appendage. The appendage was morphologically a right appendage. - Atrial septum: No defect or patent foramen ovale was identified. - Tricuspid valve: There was mild regurgitation. - Pericardium, extracardiac: There was no pericardial effusion.  Impressions:  - No cardiac source of emboli was indentified. No PFO.   There are two jets of centrally directed mitral regurgitation   that is moderate to severe.   Loop recorder insertion 11/4  Today's assessment: S: feels well, no complaints, no numbness or tingling, no weakness, no difficulty speaking or swallowing. O: Vitals:  Vitals:   08/24/18 0413 08/24/18 0857  BP: 124/78 120/67  Pulse: (!) 110 (!) 113  Resp: 15 16  Temp: 98.4 F (36.9 C) 99.4 F (37.4 C)  SpO2: 97% 97%    Constitutional:  . Appears calm and comfortable Eyes:  . pupils and  irises appear normal ENMT:  . grossly normal hearing  Respiratory:  . CTA bilaterally, no w/r/r.  . Respiratory effort normal.  Cardiovascular:  . RRR, no m/r/g . No LE extremity edema   Musculoskeletal:  . RUE, LUE, RLE, LLE   o strength and tone normal, no atrophy, no abnormal movements Skin:  . Chronic venous stasis changes bilateral lower extremities. Psychiatric:  . Mental status o Mood, affect appropriate o Oriented to self, hospital, month, not year.  Reason for hospitalization    Discharge Instructions  Discharge Instructions    Ambulatory referral to Neurology   Complete by:  As directed    Follow up with stroke clinic NP (Jessica Vanschaick or Cecille Rubin, if both not available, consider Zachery Dauer, or A[place]) at [facility] in about 4 weeks. Thanks.   Diet - low sodium heart healthy   Complete by:  As directed    Diet Carb Modified   Complete by:  As directed    Discharge instructions   Complete by:  As directed    Call your physician or seek immediate medical attention for weakness, numbness, difficulty speaking or swallowing, confusion, palpitations, chest pain, shortness of breath, swelling or worsening of condition.  Ambulate with assistance.     Allergies as of 08/24/2018      Reactions   Codeine Other (See Comments)   Unknown reaction  Penicillins Other (See Comments)   Unknown reaction Has patient had a PCN reaction causing immediate rash, facial/tongue/throat swelling, SOB or lightheadedness with hypotension: Uknown Has patient had a PCN reaction causing severe rash involving mucus membranes or skin necrosis: Uknown Has patient had a PCN reaction that required hospitalization: Unknown Has patient had a PCN reaction occurring within the last 10 years: No If all of the above answers are "NO", then may proceed with Cephalosporin use.      Medication List    STOP taking these medications   potassium chloride 10 MEQ tablet Commonly known as:  K-DUR       TAKE these medications   aspirin 325 MG tablet Take 1 tablet (325 mg total) by mouth daily.   ezetimibe 10 MG tablet Commonly known as:  ZETIA Take 10 mg by mouth daily.   furosemide 20 MG tablet Commonly known as:  LASIX Take 40 mg by mouth daily.   GLIPIZIDE XL 5 MG 24 hr tablet Generic drug:  glipiZIDE Take 5 mg by mouth.   lisinopril 10 MG tablet Commonly known as:  PRINIVIL,ZESTRIL Take 20 mg by mouth daily.   metoprolol tartrate 50 MG tablet Commonly known as:  LOPRESSOR Take 1 tablet (50 mg total) by mouth 2 (two) times daily.   rosuvastatin 20 MG tablet Commonly known as:  CRESTOR Take 20 mg by mouth daily.   sertraline 25 MG tablet Commonly known as:  ZOLOFT Take 25 mg by mouth at bedtime.   TOUJEO SOLOSTAR 300 UNIT/ML Sopn Generic drug:  Insulin Glargine Inject 50 Units into the skin 2 (two) times daily. 20 units in the morning and 8 units at bedtime            Durable Medical Equipment  (From admission, onward)         Start     Ordered   08/24/18 0900  For home use only DME Hospital bed  Once    Question:  Bed type  Answer:  Semi-electric   08/24/18 0900         Allergies  Allergen Reactions  . Codeine Other (See Comments)    Unknown reaction  . Penicillins Other (See Comments)    Unknown reaction  Has patient had a PCN reaction causing immediate rash, facial/tongue/throat swelling, SOB or lightheadedness with hypotension: Uknown Has patient had a PCN reaction causing severe rash involving mucus membranes or skin necrosis: Uknown Has patient had a PCN reaction that required hospitalization: Unknown Has patient had a PCN reaction occurring within the last 10 years: No If all of the above answers are "NO", then may proceed with Cephalosporin use.    The results of significant diagnostics from this hospitalization (including imaging, microbiology, ancillary and laboratory) are listed below for reference.    Significant Diagnostic  Studies: Mr Brain Wo Contrast  Result Date: 08/19/2018 CLINICAL DATA:  Fall x2. This occurred yesterday. Last seen normal at 9 p.m. 08/18/2018. Slurred speech and LEFT-sided weakness are noted upon awakening today. EXAM: MRI HEAD WITHOUT CONTRAST MRA HEAD WITHOUT CONTRAST TECHNIQUE: Multiplanar, multiecho pulse sequences of the brain and surrounding structures were obtained without intravenous contrast. Angiographic images of the head were obtained using MRA technique without contrast. COMPARISON:  CT code stroke 08/19/2018.  CT head 01/01/2016. FINDINGS: Motion degraded exam. MRI HEAD FINDINGS Brain: Large area of restricted diffusion corresponding low ADC, affecting the RIGHT MCA territory as predicted from CT, representing acute RIGHT MCA posterior division M2 thrombosis with acute infarction. This  affects the frontal, temporal, parietal, and insular lobes, as well as adjacent white matter. There may be posterior limb internal capsule involvement, but the lentiform nucleus is largely spared. No hemorrhage, mass lesion, hydrocephalus, or extra-axial fluid. Generalized atrophy. Hypoattenuation of white matter, likely small vessel disease. Chronic deep white matter lacunar infarcts most notable RIGHT frontal subcortical white matter adjacent to caudate, also chronic LEFT and RIGHT parietal cortical infarcts. BILATERAL cerebellar infarcts also noted of a chronic nature. Vascular: Reported separately Skull and upper cervical spine: Normal marrow Sinuses/Orbits: No significant opacity or layering fluid. BILATERAL cataract extraction. Other: None MRA HEAD FINDINGS Motion degraded exam.  Small or subtle lesions could be overlooked. Gross patency of the internal carotid arteries bilaterally without visible flow reducing stenosis. Diffusely narrowed LEFT A1 ACA, with dominant RIGHT anterior cerebral artery largely supplying both A2 and A3 segments. Unremarkable LEFT M1 MCA and proximal M2 segments. Proximal RIGHT M1  MCA is patent, but there is some loss of signal in its distal aspect. The anterior division of the M2 MCA is patent, but there is no posterior division flow related enhancement observed consistent with thrombosis contributing to the acute pattern of infarction. The basilar artery is widely patent. Artifactual misregistration in its midportion. Both vertebrals supply the basilar. Both PCA segments are unremarkable proximally. Cerebellar branches poorly visualized. No definite saccular aneurysm. IMPRESSION: Large, nonhemorrhagic, acute infarction involving RIGHT MCA posterior M2 division territory, with corresponding thrombosis of that vessel on MRA. No significant midline shift or brain swelling at this time. Superimposed chronic ischemia in the brain, and chronic microvascular ischemic change. Electronically Signed   By: Staci Righter M.D.   On: 08/19/2018 15:04   Mr Rebecca Hardy Head/brain RJ Cm  Result Date: 08/19/2018 CLINICAL DATA:  Fall x2. This occurred yesterday. Last seen normal at 9 p.m. 08/18/2018. Slurred speech and LEFT-sided weakness are noted upon awakening today. EXAM: MRI HEAD WITHOUT CONTRAST MRA HEAD WITHOUT CONTRAST TECHNIQUE: Multiplanar, multiecho pulse sequences of the brain and surrounding structures were obtained without intravenous contrast. Angiographic images of the head were obtained using MRA technique without contrast. COMPARISON:  CT code stroke 08/19/2018.  CT head 01/01/2016. FINDINGS: Motion degraded exam. MRI HEAD FINDINGS Brain: Large area of restricted diffusion corresponding low ADC, affecting the RIGHT MCA territory as predicted from CT, representing acute RIGHT MCA posterior division M2 thrombosis with acute infarction. This affects the frontal, temporal, parietal, and insular lobes, as well as adjacent white matter. There may be posterior limb internal capsule involvement, but the lentiform nucleus is largely spared. No hemorrhage, mass lesion, hydrocephalus, or extra-axial  fluid. Generalized atrophy. Hypoattenuation of white matter, likely small vessel disease. Chronic deep white matter lacunar infarcts most notable RIGHT frontal subcortical white matter adjacent to caudate, also chronic LEFT and RIGHT parietal cortical infarcts. BILATERAL cerebellar infarcts also noted of a chronic nature. Vascular: Reported separately Skull and upper cervical spine: Normal marrow Sinuses/Orbits: No significant opacity or layering fluid. BILATERAL cataract extraction. Other: None MRA HEAD FINDINGS Motion degraded exam.  Small or subtle lesions could be overlooked. Gross patency of the internal carotid arteries bilaterally without visible flow reducing stenosis. Diffusely narrowed LEFT A1 ACA, with dominant RIGHT anterior cerebral artery largely supplying both A2 and A3 segments. Unremarkable LEFT M1 MCA and proximal M2 segments. Proximal RIGHT M1 MCA is patent, but there is some loss of signal in its distal aspect. The anterior division of the M2 MCA is patent, but there is no posterior division flow related enhancement observed consistent  with thrombosis contributing to the acute pattern of infarction. The basilar artery is widely patent. Artifactual misregistration in its midportion. Both vertebrals supply the basilar. Both PCA segments are unremarkable proximally. Cerebellar branches poorly visualized. No definite saccular aneurysm. IMPRESSION: Large, nonhemorrhagic, acute infarction involving RIGHT MCA posterior M2 division territory, with corresponding thrombosis of that vessel on MRA. No significant midline shift or brain swelling at this time. Superimposed chronic ischemia in the brain, and chronic microvascular ischemic change. Electronically Signed   By: Staci Righter M.D.   On: 08/19/2018 15:04   Ct Head Code Stroke Wo Contrast  Result Date: 08/19/2018 CLINICAL DATA:  Code stroke. 84 year old female with left side weakness. Last seen well 2130 hours yesterday. EXAM: CT HEAD WITHOUT  CONTRAST TECHNIQUE: Contiguous axial images were obtained from the base of the skull through the vertex without intravenous contrast. COMPARISON:  Head CT 01/01/2016 and earlier. FINDINGS: Brain: Confluent cytotoxic edema encompassing an area of 8 centimeters in the right hemisphere corresponding to the posterior right MCA territory. Posterior right insula and temporal lobe involvement. There is some superimposed chronic encephalomalacia underlying the acute ischemia as seen in 2017. There is no associated hemorrhage or significant mass effect. Superimposed much smaller area of cortical and subcortical white matter hypodensity in the contralateral posterior left MCA territory best seen on series 2, image 16. This is new since 2017. Chronic but increased hypodensity in the bilateral corona radiata and left thalamus since 2017. Chronic cerebellar infarcts appear stable and greater in the right hemisphere. Stable brainstem. No midline shift, ventriculomegaly. Vascular: Calcified atherosclerosis at the skull base. No suspicious intracranial vascular hyperdensity. Skull: No acute osseous abnormality identified. Hyperostosis of the calvarium, normal variant. Sinuses/Orbits: Chronic sphenoid mucoperiosteal thickening. Mild left sphenoid mucosal thickening and bubbly opacity is chronic but increased. Tympanic cavities and mastoids remain clear. Other: No acute orbit or scalp soft tissue findings. ASPECTS [facility] Stroke Program Early CT Score) - Ganglionic level infarction (caudate, lentiform nuclei, internal capsule, insula, M1-M3 cortex): 4 - Supraganglionic infarction (M4-M6 cortex): 1 Total score (0-10 with 10 being normal): 5 (in the right hemisphere). IMPRESSION: 1. Acute on chronic posterior Right MCA territory infarct. No hemorrhage or mass effect. ASPECTS 5. 2. Superimposed age indeterminate much smaller posterior Left MCA territory ischemia (series 2, image 16), new since 2017. 3. Underlying chronic small vessel  disease and chronic bilateral cerebellar infarcts. 4. Study discussed by telephone with Dr. Julianne Rice on 08/19/2018 at 10:06 . Electronically Signed   By: Rebecca Hardy M.D.   On: 08/19/2018 10:07   Vas US Carotid  Result Date: 08/20/2018 Carotid Arterial Duplex Study Indications: CVA. Performing Technologist: Abram Sander  Examination Guidelines: A complete evaluation includes B-mode imaging, spectral Doppler, color Doppler, and power Doppler as needed of all accessible portions of each vessel. Bilateral testing is considered an integral part of a complete examination. Limited examinations for reoccurring indications may be performed as noted.  Right Carotid Findings: +----------+--------+--------+--------+-----------+--------+           PSV cm/sEDV cm/sStenosisDescribe   Comments +----------+--------+--------+--------+-----------+--------+ CCA Prox  76      6               homogeneous         +----------+--------+--------+--------+-----------+--------+ CCA Distal58      11              homogeneous         +----------+--------+--------+--------+-----------+--------+ ICA Prox  101     14  1-39%   homogeneous         +----------+--------+--------+--------+-----------+--------+ ICA Distal63      11                                  +----------+--------+--------+--------+-----------+--------+ ECA       117                                         +----------+--------+--------+--------+-----------+--------+ +----------+--------+-------+--------+-------------------+           PSV cm/sEDV cmsDescribeArm Pressure (mmHG) +----------+--------+-------+--------+-------------------+ Subclavian89                                         +----------+--------+-------+--------+-------------------+ +---------+--------+--+--------+-+---------+ VertebralPSV cm/s48EDV cm/s9Antegrade +---------+--------+--+--------+-+---------+  Left Carotid Findings:  +----------+--------+--------+--------+-----------+--------+           PSV cm/sEDV cm/sStenosisDescribe   Comments +----------+--------+--------+--------+-----------+--------+ CCA Prox  80      8               homogeneous         +----------+--------+--------+--------+-----------+--------+ CCA Distal89      14              homogeneous         +----------+--------+--------+--------+-----------+--------+ ICA Prox  85      23      1-39%   homogeneous         +----------+--------+--------+--------+-----------+--------+ ICA Distal66      21                                  +----------+--------+--------+--------+-----------+--------+ ECA       115                                         +----------+--------+--------+--------+-----------+--------+ +----------+--------+--------+--------+-------------------+ SubclavianPSV cm/sEDV cm/sDescribeArm Pressure (mmHG) +----------+--------+--------+--------+-------------------+           119                                         +----------+--------+--------+--------+-------------------+ +---------+--------+--+--------+--+---------+ VertebralPSV cm/s43EDV cm/s12Antegrade +---------+--------+--+--------+--+---------+  Summary: Right Carotid: Velocities in the right ICA are consistent with a 1-39% stenosis. Left Carotid: Velocities in the left ICA are consistent with a 1-39% stenosis. Vertebrals:  Bilateral vertebral arteries demonstrate antegrade flow. Subclavians: Normal flow hemodynamics were seen in bilateral subclavian              arteries. *See table(s) above for measurements and observations.  Electronically signed by Antony Contras MD on 08/20/2018 at 2:03:49 PM.    Final     Microbiology: Recent Results (from the past 240 hour(s))  Urine culture     Status: Abnormal   Collection Time: 08/19/18  9:35 AM  Result Value Ref Range Status   Specimen Description   Final    URINE, CLEAN CATCH Performed at [facility], [address] [place], [place] [address]    Special Requests   Final    NONE Performed at [facility], [place]  [address], [place], [place] [address]    Culture >=100,000 COLONIES/mL ENTEROBACTER CLOACAE (A)  Final   Report Status 08/22/2018 FINAL  Final   Organism ID, Bacteria ENTEROBACTER CLOACAE (A)  Final      Susceptibility   Enterobacter cloacae - MIC*    CEFAZOLIN RESISTANT Resistant     CEFTRIAXONE <=1 SENSITIVE Sensitive     CIPROFLOXACIN <=0.25 SENSITIVE Sensitive     GENTAMICIN <=1 SENSITIVE Sensitive     IMIPENEM <=0.25 SENSITIVE Sensitive     NITROFURANTOIN <=16 SENSITIVE Sensitive     TRIMETH/SULFA <=20 SENSITIVE Sensitive     PIP/TAZO <=4 SENSITIVE Sensitive     * >=100,000 COLONIES/mL ENTEROBACTER CLOACAE     Labs: Basic Metabolic Panel: Recent Labs  Lab 08/19/18 0935 08/19/18 0946  NA 132* 133*  K 3.7 3.8  CL 98 98  CO2 24  --   GLUCOSE 190* 185*  BUN 29* 29*  CREATININE 1.63* 1.60*  CALCIUM 9.2  --    Liver Function Tests: Recent Labs  Lab 08/19/18 0935  AST 35  ALT 17  ALKPHOS 51  BILITOT 1.1  PROT 7.2  ALBUMIN 4.2   CBC: Recent Labs  Lab 08/19/18 0935 08/19/18 0946  WBC 11.1*  --   NEUTROABS 6.1  --   HGB 13.0 13.3  HCT 39.5 39.0  MCV 88.6  --   PLT 155  --     CBG: Recent Labs  Lab 08/22/18 2100 08/23/18 0604 08/23/18 1609 08/23/18 2142 08/24/18 0631  GLUCAP 189* 164* 176* 166* 168*    Principal Problem:   Acute ischemic cerebrovascular accident (CVA) involving right middle cerebral artery territory [facility]) Active Problems:   Left-sided weakness   HTN (hypertension)   HLD (hyperlipidemia)   CKD (chronic kidney disease) stage 3, GFR 30-59 ml/min (HCC)   Type 2 diabetes mellitus with hyperlipidemia (HCC)   Narrow complex tachycardia ([place])   Time coordinating discharge: 35 minutes  Signed:  [name], MD [facility] 08/24/2018, 9:12 AM

## 2018-08-24 NOTE — Progress Notes (Signed)
Reviewed AVS discharge instructions with patient and her grandson at bedside. Patient/caregiver verbalizes understanding of instructions received. AVS and prescriptions received by patient/caregiver. If present, telemetry box removed and central cardiac monitoring department notified of discharge. Peripheral IV removed, site benign with tip intact. Patient awaiting daughter to arrive with her clothes and ride for discharge.

## 2018-08-24 NOTE — Care Management (Signed)
    Durable Medical Equipment  (From admission, onward)         Start     Ordered   08/24/18 0900  For home use only DME Hospital bed  Once    Question:  Bed type  Answer:  Semi-electric   08/24/18 0900

## 2018-08-24 NOTE — Progress Notes (Addendum)
Tx performed and charted by SPTA under direct guidance and supervision of PTA at all times. This session was performed under the supervision of a licensed clinician.   Charting reviewed for accuracy and depicts tx performed and services provided.  Governor Rooks, PTA Acute Rehabilitation Services Pager [phone] Office [phone]    Physical Therapy Treatment Patient Details Name: Rebecca Hardy MRN: [id number] DOB: [birth year] Today's Date: 08/24/2018    History of Present Illness Pt is an 84 y/o female who presented to the [facility] ED on Thursday morning for evaluation of slurred speech and left sided weakness. MRI revealed MRI showed large nonhemorrhagic acute infarction right MCA posterior M2 division territory. tPA not given. PMH including but not limited to HTN, DM, HLD, CKD3 and prior CVA.     PT Comments    Pt was willing to participate in therapy today. Pt stated she needed to go to the restroom upon arrival. Pt's grandson was present this session and stated that she has been walking to the restroom without her RW and handheld assist. Pt ambulated to restroom with handheld assist and required min assist to maintain balance. Pt ambulated 50 ft with RW requiring VC/TC for safety and navigating around objects; pt required one standing rest break. Pt navigated stairs with min assist for safety and VC/TC for proper sequence. Pt's grandson was educated on stair training technique. HEP was given at end of session. Pt would benefit from cont'd PT in order to maximize function. Based on pt's functional gains pt seems appropriate for HHPT with 24 hr supervision. Will inform supervising PT of change in recommendation actions.   Follow Up Recommendations  Home health PT;Supervision/Assistance - 24 hour     Equipment Recommendations  None recommended by PT    Recommendations for Other Services       Precautions / Restrictions Precautions Precautions: Fall Precaution Comments: L  inattention Restrictions Weight Bearing Restrictions: No Other Position/Activity Restrictions: L side neglect    Mobility  Bed Mobility Overal bed mobility: Needs Assistance Bed Mobility: Supine to Sit;Sit to Supine     Supine to sit: Supervision Sit to supine: Min guard   General bed mobility comments: increased time and effort  Transfers Overall transfer level: Needs assistance Equipment used: Rolling walker (2 wheeled);1 person hand held assist Transfers: Sit to/from Stand Sit to Stand: Min assist         General transfer comment: First transfer was handheld assist from EOB so pt could ambulate to restroom. VC/TC for safety  Ambulation/Gait Ambulation/Gait assistance: Min assist Gait Distance (Feet): 50 Feet(2 trails before 50 ft to and from bathroom. required 1 standing rest break.) Assistive device: Rolling walker (2 wheeled);1 person hand held assist Gait Pattern/deviations: Step-through pattern;Decreased step length - right;Decreased step length - left;Decreased stride length;Drifts right/left;Trunk flexed;Narrow base of support     General Gait Details: Pt ambulated to restroom in room hand held assist. Pt required Min A to maintain balance with RW and VC for safety with AD.     Stairs Stairs: Yes Stairs assistance: Min assist Stair Management: Two rails Number of Stairs: 4 General stair comments: Pt required VC/TC for safety and proper sequence for navigating stairs.    Wheelchair Mobility    Modified Rankin (Stroke Patients Only) Modified Rankin (Stroke Patients Only) Pre-Morbid Rankin Score: Moderate disability Modified Rankin: Moderately severe disability     Balance Overall balance assessment: Needs assistance Sitting-balance support: Feet supported Sitting balance-Leahy Scale: Fair Sitting balance - Comments: cues for  full EOB positioning with pt initially sitting turned to the right    Standing balance support: Bilateral upper extremity  supported Standing balance-Leahy Scale: Poor Standing balance comment: reaches for external support of rw or sink/furniture                            Cognition Arousal/Alertness: Awake/alert Behavior During Therapy: WFL for tasks assessed/performed Overall Cognitive Status: Impaired/Different from baseline Area of Impairment: Orientation;Attention;Memory;Following commands;Safety/judgement;Awareness;Problem solving                 Orientation Level: Disoriented to;Situation;Time Current Attention Level: Focused Memory: Decreased recall of precautions;Decreased short-term memory Following Commands: Follows one step commands with increased time Safety/Judgement: Decreased awareness of safety;Decreased awareness of deficits Awareness: Intellectual Problem Solving: Slow processing;Decreased initiation;Difficulty sequencing;Requires verbal cues General Comments: pt stated she is ready to go home today      Exercises Total Joint Exercises Ankle Circles/Pumps: AROM;10 reps;Right;Left;Seated Quad Sets: AROM;Right;Left;10 reps;Supine Heel Slides: AROM;10 reps;Supine;Right;Left Long Arc Quad: AROM;10 reps;Right;Left;Seated    General Comments        Pertinent Vitals/Pain Faces Pain Scale: No hurt    Home Living                      Prior Function            PT Goals (current goals can now be found in the care plan section) Acute Rehab PT Goals Patient Stated Goal: return home PT Goal Formulation: With patient/family Time For Goal Achievement: 09/03/18 Potential to Achieve Goals: Good Progress towards PT goals: Progressing toward goals    Frequency    Min 4X/week      PT Plan Discharge plan needs to be updated    Co-evaluation              AM-PAC PT "6 Clicks" Daily Activity  Outcome Measure  Difficulty turning over in bed (including adjusting bedclothes, sheets and blankets)?: A Lot Difficulty moving from lying on back to  sitting on the side of the bed? : A Little Difficulty sitting down on and standing up from a chair with arms (e.g., wheelchair, bedside commode, etc,.)?: A Little Help needed moving to and from a bed to chair (including a wheelchair)?: A Little Help needed walking in hospital room?: A Little Help needed climbing 3-5 steps with a railing? : A Little 6 Click Score: 17    End of Session Equipment Utilized During Treatment: Gait belt Activity Tolerance: Patient tolerated treatment well Patient left: with call bell/phone within reach;with family/visitor present;with chair alarm set;in chair Nurse Communication: Mobility status PT Visit Diagnosis: Other abnormalities of gait and mobility (R26.89);Other symptoms and signs involving the nervous system (R29.898)     Time: 3335-4562 PT Time Calculation (min) (ACUTE ONLY): 44 min  Charges:  $Gait Training: 8-22 mins $Therapeutic Exercise: 8-22 mins $Therapeutic Activity: 8-22 mins                    [address] SPTA   [place] 08/24/2018, 1:15 PM

## 2018-08-24 NOTE — Progress Notes (Signed)
  Speech Language Pathology Treatment: Cognitive-Linquistic  Patient Details Name: Rebecca Hardy MRN: [id number] DOB: 1932/12/18 Today's Date: 08/24/2018 Time: 3500-9381 SLP Time Calculation (min) (ACUTE ONLY): 20 min  Assessment / Plan / Recommendation Clinical Impression  Patient was willing to participate in therapy today. She was able to recall she is discharging home and her daughter will be picking her up soon. There was no evidence of dysarthria and her cognitive skills are improving. She was able to name 6/8 pictures, recall sequence of  5 numbers, identify relationships between items and follow 2- step directions. She was oriented to self, place, but not todays date. Patient would benefit from Home health speech therapy to continue to address cognitive issues. Family was not present to provide baseline abilities.    HPI HPI: 84 y.o. female who presented to the [facility] ED on Thursday morning for evaluation of slurred speech and left sided weakness. Her PMHx includes HTN, DM, HLD, CKD3 and prior CVA. tPA was not given due to time criteria. CT head revealed Acute on chronic posterior Right MCA territory infarct. Superimposed age indeterminate much smaller posterior Left MCA territory ischemia. U/A showed possible infection      SLP Plan   Continue goals of care with home health Speech Therapy       Recommendations  Diet recommendations: Regular;Thin liquid Liquids provided via: Cup;Straw Medication Administration: Whole meds with liquid Supervision: Patient able to self feed                        [place], MA, CCC-SLP 08/24/2018 1:17 PM

## 2018-08-25 DIAGNOSIS — Z853 Personal history of malignant neoplasm of breast: Secondary | ICD-10-CM | POA: Diagnosis not present

## 2018-08-25 DIAGNOSIS — Z9181 History of falling: Secondary | ICD-10-CM | POA: Diagnosis not present

## 2018-08-25 DIAGNOSIS — N183 Chronic kidney disease, stage 3 (moderate): Secondary | ICD-10-CM | POA: Diagnosis not present

## 2018-08-25 DIAGNOSIS — Z7982 Long term (current) use of aspirin: Secondary | ICD-10-CM | POA: Diagnosis not present

## 2018-08-25 DIAGNOSIS — E1122 Type 2 diabetes mellitus with diabetic chronic kidney disease: Secondary | ICD-10-CM | POA: Diagnosis not present

## 2018-08-25 DIAGNOSIS — E785 Hyperlipidemia, unspecified: Secondary | ICD-10-CM | POA: Diagnosis not present

## 2018-08-25 DIAGNOSIS — I69328 Other speech and language deficits following cerebral infarction: Secondary | ICD-10-CM | POA: Diagnosis not present

## 2018-08-25 DIAGNOSIS — I129 Hypertensive chronic kidney disease with stage 1 through stage 4 chronic kidney disease, or unspecified chronic kidney disease: Secondary | ICD-10-CM | POA: Diagnosis not present

## 2018-08-25 DIAGNOSIS — I340 Nonrheumatic mitral (valve) insufficiency: Secondary | ICD-10-CM | POA: Diagnosis not present

## 2018-08-25 DIAGNOSIS — K219 Gastro-esophageal reflux disease without esophagitis: Secondary | ICD-10-CM | POA: Diagnosis not present

## 2018-08-25 DIAGNOSIS — I69354 Hemiplegia and hemiparesis following cerebral infarction affecting left non-dominant side: Secondary | ICD-10-CM | POA: Diagnosis not present

## 2018-08-30 ENCOUNTER — Other Ambulatory Visit: Payer: Self-pay

## 2018-08-30 NOTE — Patient Outreach (Addendum)
[facility]) Care Management  08/30/2018  MADELENA MATURIN 1933/03/07 [id number]     EMMI-Stroke RED ON EMMI ALERT Day # 3 Date: 08/28/18 Red Alert Reason: " Felling worse overall? Yes   New problems walking/talking/speaking/seeing? Yes"   Outreach attempt # 1 to patient. Spoke with briefly as patient was currently working with therapy. Reviewed and addressed red alerts. Family has been assisting patient answering the calls and reported automated machine was having difficulty hearing them and recorded all responses "opposite" of how they were responding. Patient has not been feeling worse nor had any new and/or worsening problems. RN CM confirmed that patient has all her meds in the home and no issues or concerns regarding them. She has follow up appts in place and no transportation issues. No further RN CM needs or concerns at this time. Advised patient/family that they would continue to get automated EMMI-Stroke post discharge calls to assess how they are doing following recent hospitalization and will receive a call from a nurse if any of their responses were abnormal. Caregiver voiced understanding and was appreciative of f/u call.       Plan: RN CM will close case at this time.    Enzo Montgomery, RN,BSN,CCM [place] Management Telephonic Care Management Coordinator Direct Phone: [phone] Toll Free: [phone] Fax: [phone]

## 2018-09-02 ENCOUNTER — Ambulatory Visit: Payer: Medicare Other

## 2018-09-02 DIAGNOSIS — R000 Tachycardia, unspecified: Secondary | ICD-10-CM | POA: Diagnosis not present

## 2018-09-02 DIAGNOSIS — I340 Nonrheumatic mitral (valve) insufficiency: Secondary | ICD-10-CM | POA: Diagnosis not present

## 2018-09-02 DIAGNOSIS — E1122 Type 2 diabetes mellitus with diabetic chronic kidney disease: Secondary | ICD-10-CM | POA: Diagnosis not present

## 2018-09-02 DIAGNOSIS — N183 Chronic kidney disease, stage 3 (moderate): Secondary | ICD-10-CM | POA: Diagnosis not present

## 2018-09-03 DIAGNOSIS — E1142 Type 2 diabetes mellitus with diabetic polyneuropathy: Secondary | ICD-10-CM | POA: Diagnosis not present

## 2018-09-03 DIAGNOSIS — B351 Tinea unguium: Secondary | ICD-10-CM | POA: Diagnosis not present

## 2018-09-04 ENCOUNTER — Encounter (HOSPITAL_COMMUNITY): Payer: Self-pay | Admitting: Internal Medicine

## 2018-09-06 ENCOUNTER — Ambulatory Visit (INDEPENDENT_AMBULATORY_CARE_PROVIDER_SITE_OTHER): Payer: Medicare Other | Admitting: *Deleted

## 2018-09-06 ENCOUNTER — Other Ambulatory Visit: Payer: Self-pay

## 2018-09-06 DIAGNOSIS — I63511 Cerebral infarction due to unspecified occlusion or stenosis of right middle cerebral artery: Secondary | ICD-10-CM

## 2018-09-06 LAB — CUP PACEART REMOTE DEVICE CHECK
Date Time Interrogation Session: 20191104172214
MDC IDC PG IMPLANT DT: 20191104

## 2018-09-06 NOTE — Patient Outreach (Signed)
[facility]) Care Management  09/06/2018  Rebecca Hardy 10-18-1933 [id number]     EMMI-Stroke RED ON EMMI ALERT Day # 9 Date: 09/03/18 Red Alert Reason: " Felling worse overall? Yes"   Outreach attempt # 1 to patient. A female relative answered the phone and reported patient was currently working with therapist in the home and unable to talk at present.      Plan: RN CM will make outreach attempt to patient within 3-4 business days.   Enzo Montgomery, RN,BSN,CCM [place] Management Telephonic Care Management Coordinator Direct Phone: [phone] Toll Free: [phone] Fax: [phone]

## 2018-09-06 NOTE — Progress Notes (Signed)
Loop wound check in clinic.  Steri-strips removed prior to OV. Wound without redness or edema. Incision edges approximated, wound well healed Battery status: Good. R-waves  0.31mV. 0 symptom episodes,  0 tachy episodes, 0 pause episodes, 0 brady episodes. 0 AF episodes. Monthly summary reports and ROV with GT PRN. Pt's family educated about home monitor, asked them to move monitor from behind the headboard and to send a manual transmission to ensure monitor is working properly, material provided to send manual transmission

## 2018-09-07 ENCOUNTER — Other Ambulatory Visit: Payer: Self-pay

## 2018-09-07 NOTE — Patient Outreach (Signed)
[facility]) Care Management  09/07/2018  MARNETTE PERKINS 1933/01/28 [id number]   EMMI-Stroke RED ON EMMI ALERT Day # 9 Date: 09/03/18 Red Alert Reason: " Felling worse overall? Yes"     Outreach attempt #2 to patient. Spoke with patient and caregiver. Patient is doing fine. Reviewed and addressed red alert. Machine misunderstood response again. Caregiver states patient is doing just fine and has not been feeling worse. Denies any further RN CM needs or concerns at this time. They are aware that patient will continue to get automated post discharge Stroke calls.       Plan: RN CM will close case at this time.    Enzo Montgomery, RN,BSN,CCM [place] Management Telephonic Care Management Coordinator Direct Phone: [phone] Toll Free: [phone] Fax: [phone]

## 2018-09-23 DIAGNOSIS — I471 Supraventricular tachycardia: Secondary | ICD-10-CM | POA: Diagnosis not present

## 2018-09-23 DIAGNOSIS — I639 Cerebral infarction, unspecified: Secondary | ICD-10-CM | POA: Diagnosis not present

## 2018-09-27 ENCOUNTER — Ambulatory Visit (INDEPENDENT_AMBULATORY_CARE_PROVIDER_SITE_OTHER): Payer: Medicare Other

## 2018-09-27 DIAGNOSIS — I10 Essential (primary) hypertension: Secondary | ICD-10-CM | POA: Diagnosis not present

## 2018-09-27 DIAGNOSIS — N183 Chronic kidney disease, stage 3 (moderate): Secondary | ICD-10-CM | POA: Diagnosis not present

## 2018-09-27 DIAGNOSIS — Z6826 Body mass index (BMI) 26.0-26.9, adult: Secondary | ICD-10-CM | POA: Diagnosis not present

## 2018-09-27 DIAGNOSIS — E1165 Type 2 diabetes mellitus with hyperglycemia: Secondary | ICD-10-CM | POA: Diagnosis not present

## 2018-09-27 DIAGNOSIS — E1122 Type 2 diabetes mellitus with diabetic chronic kidney disease: Secondary | ICD-10-CM | POA: Diagnosis not present

## 2018-09-27 DIAGNOSIS — R000 Tachycardia, unspecified: Secondary | ICD-10-CM | POA: Diagnosis not present

## 2018-09-27 DIAGNOSIS — E782 Mixed hyperlipidemia: Secondary | ICD-10-CM | POA: Diagnosis not present

## 2018-09-27 DIAGNOSIS — I63511 Cerebral infarction due to unspecified occlusion or stenosis of right middle cerebral artery: Secondary | ICD-10-CM | POA: Diagnosis not present

## 2018-09-29 NOTE — Progress Notes (Signed)
Carelink Summary Report / Loop Recorder 

## 2018-10-01 ENCOUNTER — Telehealth: Payer: Self-pay | Admitting: Cardiology

## 2018-10-01 NOTE — Telephone Encounter (Signed)
Spoke w/ pt son and requested that pt send a manual transmission w/ her home monitor. Pt son verbalized understanding.

## 2018-10-03 ENCOUNTER — Emergency Department (HOSPITAL_COMMUNITY)
Admission: EM | Admit: 2018-10-03 | Discharge: 2018-10-03 | Disposition: A | Discharge status: Home / Self Care | Payer: Medicare Other | Attending: Emergency Medicine | Admitting: Emergency Medicine

## 2018-10-03 ENCOUNTER — Encounter (HOSPITAL_COMMUNITY): Payer: Self-pay | Admitting: Emergency Medicine

## 2018-10-03 ENCOUNTER — Emergency Department (HOSPITAL_COMMUNITY): Payer: Medicare Other

## 2018-10-03 DIAGNOSIS — Z79899 Other long term (current) drug therapy: Secondary | ICD-10-CM | POA: Insufficient documentation

## 2018-10-03 DIAGNOSIS — Z7984 Long term (current) use of oral hypoglycemic drugs: Secondary | ICD-10-CM | POA: Insufficient documentation

## 2018-10-03 DIAGNOSIS — R000 Tachycardia, unspecified: Secondary | ICD-10-CM | POA: Diagnosis not present

## 2018-10-03 DIAGNOSIS — E119 Type 2 diabetes mellitus without complications: Secondary | ICD-10-CM | POA: Diagnosis not present

## 2018-10-03 DIAGNOSIS — I639 Cerebral infarction, unspecified: Secondary | ICD-10-CM | POA: Diagnosis not present

## 2018-10-03 DIAGNOSIS — I700 Atherosclerosis of aorta: Secondary | ICD-10-CM | POA: Diagnosis not present

## 2018-10-03 DIAGNOSIS — R531 Weakness: Secondary | ICD-10-CM

## 2018-10-03 DIAGNOSIS — N183 Chronic kidney disease, stage 3 (moderate): Secondary | ICD-10-CM | POA: Diagnosis not present

## 2018-10-03 DIAGNOSIS — W19XXXA Unspecified fall, initial encounter: Secondary | ICD-10-CM

## 2018-10-03 DIAGNOSIS — I129 Hypertensive chronic kidney disease with stage 1 through stage 4 chronic kidney disease, or unspecified chronic kidney disease: Secondary | ICD-10-CM | POA: Insufficient documentation

## 2018-10-03 DIAGNOSIS — I959 Hypotension, unspecified: Secondary | ICD-10-CM | POA: Diagnosis not present

## 2018-10-03 LAB — URINALYSIS, ROUTINE W REFLEX MICROSCOPIC
BILIRUBIN URINE: NEGATIVE
Glucose, UA: NEGATIVE mg/dL
Hgb urine dipstick: NEGATIVE
KETONES UR: NEGATIVE mg/dL
Leukocytes, UA: NEGATIVE
Nitrite: NEGATIVE
PROTEIN: NEGATIVE mg/dL
Specific Gravity, Urine: 1.006 (ref 1.005–1.030)
pH: 5 (ref 5.0–8.0)

## 2018-10-03 LAB — COMPREHENSIVE METABOLIC PANEL
ALT: 19 U/L (ref 0–44)
AST: 20 U/L (ref 15–41)
Albumin: 4 g/dL (ref 3.5–5.0)
Alkaline Phosphatase: 57 U/L (ref 38–126)
Anion gap: 8 (ref 5–15)
BILIRUBIN TOTAL: 0.5 mg/dL (ref 0.3–1.2)
BUN: 47 mg/dL — ABNORMAL HIGH (ref 8–23)
CALCIUM: 9.3 mg/dL (ref 8.9–10.3)
CO2: 24 mmol/L (ref 22–32)
CREATININE: 1.36 mg/dL — AB (ref 0.44–1.00)
Chloride: 103 mmol/L (ref 98–111)
GFR calc Af Amer: 42 mL/min — ABNORMAL LOW (ref >60)
GFR, EST NON AFRICAN AMERICAN: 36 mL/min — AB (ref >60)
Glucose, Bld: 180 mg/dL — ABNORMAL HIGH (ref 70–99)
Potassium: 4.1 mmol/L (ref 3.5–5.1)
Sodium: 135 mmol/L (ref 135–145)
TOTAL PROTEIN: 7 g/dL (ref 6.5–8.1)

## 2018-10-03 LAB — CBC WITH DIFFERENTIAL/PLATELET
ABS IMMATURE GRANULOCYTES: 0.02 10*3/uL (ref 0.00–0.07)
Basophils Absolute: 0 10*3/uL (ref 0.0–0.1)
Basophils Relative: 0 %
Eosinophils Absolute: 0.3 10*3/uL (ref 0.0–0.5)
Eosinophils Relative: 4 %
HEMATOCRIT: 39.9 % (ref 36.0–46.0)
Hemoglobin: 12.7 g/dL (ref 12.0–15.0)
IMMATURE GRANULOCYTES: 0 %
LYMPHS ABS: 3.3 10*3/uL (ref 0.7–4.0)
Lymphocytes Relative: 42 %
MCH: 28.5 pg (ref 26.0–34.0)
MCHC: 31.8 g/dL (ref 30.0–36.0)
MCV: 89.5 fL (ref 80.0–100.0)
MONO ABS: 0.7 10*3/uL (ref 0.1–1.0)
MONOS PCT: 8 %
NEUTROS ABS: 3.6 10*3/uL (ref 1.7–7.7)
NEUTROS PCT: 46 %
Platelets: 177 10*3/uL (ref 150–400)
RBC: 4.46 MIL/uL (ref 3.87–5.11)
RDW: 12.5 % (ref 11.5–15.5)
WBC: 7.9 10*3/uL (ref 4.0–10.5)
nRBC: 0 % (ref 0.0–0.2)

## 2018-10-03 LAB — MAGNESIUM: MAGNESIUM: 1.9 mg/dL (ref 1.7–2.4)

## 2018-10-03 LAB — TROPONIN I: Troponin I: <0.03 ng/mL (ref <0.03)

## 2018-10-03 MED ORDER — LACTATED RINGERS IV BOLUS
500.0000 mL | Freq: Once | INTRAVENOUS | Status: AC
  Administered 2018-10-03: 500 mL via INTRAVENOUS

## 2018-10-03 NOTE — ED Triage Notes (Signed)
Patient was attempting to ambulate to bathroom without assistance of walker or cane, had sudden weakness of legs and lost balance, no c/o pain or LOC per EMS, family wants patient evaluated for increased weakness in legs.

## 2018-10-03 NOTE — Progress Notes (Signed)
Removed bobbi pens from patients hair 24 for head ct and placed into plastic bag, returned with patient to ER given to patients family in room.

## 2018-10-03 NOTE — ED Provider Notes (Signed)
[facility] EMERGENCY DEPARTMENT Provider Note   CSN: [id number] Arrival date & time: 10/03/18  0346     History   Chief Complaint Chief Complaint  Patient presents with  . Fall    HPI [place] C Lemberger is a 84 y.o. female.  The history is provided by the patient.  Fall  This is a new problem. The current episode started less than 1 hour ago. The problem occurs constantly. The problem has not changed since onset.Pertinent negatives include no chest pain, no abdominal pain, no headaches and no shortness of breath. The symptoms are aggravated by walking. Nothing relieves the symptoms. She has tried nothing for the symptoms.    Past Medical History:  Diagnosis Date  . 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 Diabetes mellitus   . GERD (gastroesophageal reflux disease)   . High cholesterol   . History of TIA (transient ischemic attack)   . Hypertension   . Stroke [facility])     Patient Active Problem List   Diagnosis Date Noted  . Acute ischemic cerebrovascular accident (CVA) involving right middle cerebral artery territory ([place]) 08/20/2018  . Narrow complex tachycardia ([place]) 08/20/2018  . Left-sided weakness 08/19/2018  . HTN (hypertension) 08/19/2018  . HLD (hyperlipidemia) 08/19/2018  . CKD (chronic kidney disease) stage 3, GFR 30-59 ml/min ([facility]) 08/19/2018  . Type 2 diabetes mellitus with hyperlipidemia ([place]) 08/19/2018    Past Surgical History:  Procedure Laterality Date  . ABDOMINAL HYSTERECTOMY    . BREAST LUMPECTOMY    . CESAREAN SECTION    . LOOP RECORDER INSERTION N/A 08/23/2018   Procedure: LOOP RECORDER INSERTION;  Surgeon: Evans Lance, MD;  Location: [place] CV LAB;  Service: Cardiovascular;  Laterality: N/A;  . TEE WITHOUT CARDIOVERSION N/A 08/23/2018   Procedure: TRANSESOPHAGEAL ECHOCARDIOGRAM (TEE);  Surgeon: Dorothy Spark, MD;  Location: [facility] ENDOSCOPY;  Service: Cardiovascular;  Laterality: N/A;     OB History   No  obstetric history on file.      Home Medications    Prior to Admission medications   Medication Sig Start Date End Date Taking? Authorizing Provider  aspirin 325 MG tablet Take 1 tablet (325 mg total) by mouth daily. Patient not taking: Reported on 09/06/2018 08/24/18   Samuella Cota, MD  ezetimibe (ZETIA) 10 MG tablet Take 10 mg by mouth daily.  08/11/18   [provider]  furosemide (LASIX) 20 MG tablet Take 40 mg by mouth daily.     [provider]  GLIPIZIDE XL 5 MG 24 hr tablet Take 5 mg by mouth.  08/11/18   [provider]  lisinopril (PRINIVIL,ZESTRIL) 10 MG tablet Take 20 mg by mouth daily.     [provider]  rosuvastatin (CRESTOR) 20 MG tablet Take 20 mg by mouth daily.  07/16/18   [provider]  sertraline (ZOLOFT) 25 MG tablet Take 25 mg by mouth at bedtime.  03/27/15   [provider]  TOUJEO SOLOSTAR 300 UNIT/ML SOPN Inject 40 Units into the skin daily.  05/21/16   [provider]    Family History Family History  Problem Relation Age of Onset  . Cancer Mother   . Hypertension Father     Social History Social History   Tobacco Use  . Smoking status: Never Smoker  . Smokeless tobacco: Never Used  Substance Use Topics  . Alcohol use: No  . Drug use: No     Allergies  Codeine and Penicillins   Review of Systems Review of Systems  Respiratory: Negative for shortness of breath.   Cardiovascular: Negative for chest pain.  Gastrointestinal: Negative for abdominal pain.  Neurological: Negative for headaches.  All other systems reviewed and are negative.    Physical Exam Updated Vital Signs BP 106/84   Pulse 64   Temp 97.9 F (36.6 C) (Oral)   Resp 15   Ht 5\' 5"  (1.651 m)   Wt 82.6 kg   SpO2 100%   BMI 30.29 kg/m   Physical Exam Vitals signs and nursing note reviewed.  Constitutional:      Appearance: She is well-developed.  HENT:     Head: Normocephalic and atraumatic.    Neck:     Musculoskeletal: Normal range of motion.  Cardiovascular:     Rate and Rhythm: Normal rate and regular rhythm.  Pulmonary:     Effort: No respiratory distress.     Breath sounds: No stridor.  Abdominal:     General: There is no distension.  Neurological:     Mental Status: She is alert.      ED Treatments / Results  Labs (all labs ordered are listed, but only abnormal results are displayed) Labs Reviewed  COMPREHENSIVE METABOLIC PANEL - Abnormal; Notable for the following components:      Result Value   Glucose, Bld 180 (*)    BUN 47 (*)    Creatinine, Ser 1.36 (*)    GFR calc non Af Amer 36 (*)    GFR calc Af Amer 42 (*)    All other components within normal limits  URINALYSIS, ROUTINE W REFLEX MICROSCOPIC - Abnormal; Notable for the following components:   Color, Urine STRAW (*)    All other components within normal limits  CBC WITH DIFFERENTIAL/PLATELET  MAGNESIUM  TROPONIN I    EKG EKG Interpretation  Date/Time:  Sunday October 03 2018 03:55:36 EST Ventricular Rate:  117 PR Interval:    QRS Duration: 98 QT Interval:  376 QTC Calculation: 525 R Axis:   -19 Text Interpretation:  Sinus tachycardia Low voltage, extremity and precordial leads Prolonged QT interval overall siilar to multiple previous Confirmed by Merrily Pew [phone]) on 10/03/2018 5:36:50 AM   Radiology Dg Chest 2 View  Result Date: 10/03/2018 CLINICAL DATA:  Leg weakness and unsteady gait. EXAM: CHEST - 2 VIEW COMPARISON:  05/21/2016 FINDINGS: Implanted loop recorder projects over the left chest wall.The cardiomediastinal contours are normal. Aortic atherosclerosis. The lungs are clear. Pulmonary vasculature is normal. No consolidation, pleural effusion, or pneumothorax. No acute osseous abnormalities are seen. IMPRESSION: 1. No acute findings. 2.  Aortic Atherosclerosis (ICD10-I70.0). Electronically Signed   By: Keith Rake M.D.   On: 10/03/2018 05:54   Ct Head Wo  Contrast  Result Date: 10/03/2018 CLINICAL DATA:  Lower extremity weakness, unsteady gait. Follow up stroke. History of stroke, hypertension, hypercholesterolemia. EXAM: CT HEAD WITHOUT CONTRAST TECHNIQUE: Contiguous axial images were obtained from the base of the skull through the vertex without intravenous contrast. COMPARISON:  MRI of the head August 19, 2018 FINDINGS: BRAIN: No intraparenchymal hemorrhage, mass effect nor midline shift. Subacute to chronic RIGHT temporoparietal infarct, resolution of mass effect. Small area LEFT parietal and LEFT occipital lobe encephalomalacia. Patchy supratentorial white matter FLAIR T2 hyperintensities. Old bilateral cerebellar infarcts. No hydrocephalus. No acute large vascular territory infarct. No abnormal extra-axial fluid collections. VASCULAR: Moderate to severe calcific atherosclerosis of the carotid siphons. SKULL: No skull fracture. No significant scalp soft tissue  swelling. SINUSES/ORBITS: Moderate chronic sphenoid sinusitis with mucoperiosteal reaction. Mastoid air cells are well aerated.The included ocular globes and orbital contents are non-suspicious. Status post bilateral ocular lens implants. OTHER: None. IMPRESSION: 1. No acute intracranial process. 2. Subacute to chronic RIGHT temporoparietal/MCA territory infarct. 3. Old small LEFT parietal/MCA and LEFT occipital/PCA territory infarcts. Old small cerebellar infarcts. 4. Moderate chronic small vessel ischemic changes. Electronically Signed   By: Elon Alas M.D.   On: 10/03/2018 05:40    Procedures Procedures (including critical care time)  Medications Ordered in ED Medications  lactated ringers bolus 500 mL (0 mLs Intravenous Stopped 10/03/18 0624)     Initial Impression / Assessment and Plan / ED Course  I have reviewed the triage vital signs and the nursing notes.  Pertinent labs & imaging results that were available during my care of the patient were reviewed by me and considered  in my medical decision making (see chart for details).     I suspect patient is slightly dehydrated with some effect from metoprolol as her daughter states it seems to have started when she started that medication back. Pt with significant improvement in HR and symptoms with fluid bolus. No e/o infection or other indications for admission.  Will hold metoprlol for now.  Will watch urine output and consider decreasing lasix if continues to be weak.   Final Clinical Impressions(s) / ED Diagnoses   Final diagnoses:  Fall, initial encounter  Weakness    ED Discharge Orders    None       Hikaru Delorenzo, Corene Cornea, MD 10/03/18 [phone]

## 2018-10-05 DIAGNOSIS — I951 Orthostatic hypotension: Secondary | ICD-10-CM | POA: Diagnosis not present

## 2018-10-07 ENCOUNTER — Other Ambulatory Visit: Payer: Self-pay | Admitting: Internal Medicine

## 2018-10-11 ENCOUNTER — Ambulatory Visit (INDEPENDENT_AMBULATORY_CARE_PROVIDER_SITE_OTHER): Payer: Medicare Other | Admitting: Adult Health

## 2018-10-11 ENCOUNTER — Encounter: Payer: Self-pay | Admitting: Adult Health

## 2018-10-11 VITALS — BP 125/52 | HR 71 | Ht 66.0 in | Wt 164.0 lb

## 2018-10-11 DIAGNOSIS — E785 Hyperlipidemia, unspecified: Secondary | ICD-10-CM | POA: Diagnosis not present

## 2018-10-11 DIAGNOSIS — I10 Essential (primary) hypertension: Secondary | ICD-10-CM

## 2018-10-11 DIAGNOSIS — I69354 Hemiplegia and hemiparesis following cerebral infarction affecting left non-dominant side: Secondary | ICD-10-CM | POA: Diagnosis not present

## 2018-10-11 DIAGNOSIS — I63411 Cerebral infarction due to embolism of right middle cerebral artery: Secondary | ICD-10-CM

## 2018-10-11 DIAGNOSIS — F411 Generalized anxiety disorder: Secondary | ICD-10-CM

## 2018-10-11 DIAGNOSIS — E1169 Type 2 diabetes mellitus with other specified complication: Secondary | ICD-10-CM

## 2018-10-11 MED ORDER — BUSPIRONE HCL 5 MG PO TABS: 5.0000 mg | ORAL_TABLET | Freq: Two times a day (BID) | ORAL | 3 refills | Status: DC

## 2018-10-11 NOTE — Progress Notes (Signed)
[facility] [address] [place]. [place] [address] [phone]       OFFICE FOLLOW UP NOTE  Ms. AASHKA SALOMONE Date of Birth:  [birth year] Medical Record Number:  [id number]   Reason for Referral:  hospital stroke follow up  CHIEF COMPLAINT:  Chief Complaint  Patient presents with  . Follow-up    Hospital Stroke follow up room 9 pt with grandson Matt in room pt can walk but legs give out    HPI: [name] is being seen today for initial visit in the office for right MCA infarct embolic secondary to unknown source but highly suspicious for AF on 08/19/2018. History obtained from patient, grandson and chart review. Reviewed all radiology images and labs personally.  Ms. LATTIE RIEGE is a 84 y.o. female with history of HTN, DM, HLD, CKD stage 3, prior stroke presenting to [facility] 10/31 with slurred speech and L HP. CT showed a subacute infarct so not a candidate for tPA. Transferred to [facility] health for further management.  CT head reviewed and showed acute on chronic right MCA infarct, smaller left MCA infarct which is new since prior scan in 2017 along with old bilateral cerebellar infarcts.  MRI/MRA showed large right MCA M2 territory infarct with corresponding thrombosis.  Carotid Doppler showed bilateral ICA 1 to 39% stenosis.  2D echo showed an EF of 50 to 55% without cardiac source embolus identified.  TEE unremarkable no evidence of PFO therefore loop recorder placed as infarct likely embolic secondary to unknown source with high suspicion for AF.  Patient was not on antithrombotic PTA and recommended initiating aspirin 325 mg daily.  Initially found to be in hypertensive urgency and stabilized during admission with long-term BP goal normotensive range.  LDL 42 and recommended continued home statin regimen.  A1c 11.6 and recommended tight glycemic control with close PCP follow-up for continued DM management.   Patient is being seen today for hospital  follow up and is accompanied by her overly involved grandson. She does continue to have left hemiparesis. Her grandson has been living with her to assist with care and safety.  She did have some memory loss prior to the stroke and grandson does not believe this is worsened post stroke.  He does endorse occasional anxiety and patient states this is due to her not being active and independent like she used to and will get restless and frustrated easily.  She has had one fall since discharge.  She does use Rollator walker at home due to intermittent knees buckling and "wobbly legs".  Grandson believe this is due to metoprolol and it was recommended to stop this after hospitalization post fall.  She was seen by her PCP and recommended to restart at 25 mg extended release formula but they have not picked this up from the pharmacy at this time.  He does state since discontinuing metoprolol, her episodes of knee buckling has subsided some and occurs only on occasion.  She was being seen by home PT/OT/ST through [facility] home [facility] but was recently released.  She has continued on aspirin 325 mg without side effects of bleeding or bruising.  Continues on Crestor and Zetia without side effects myalgias.  She does complain of bilateral heel pain and grandson believes this is due to neuropathy.  She is currently sleeping in a hospital bed and he does state that he places 2 pillows under her feet at night. Loop recorder has not shown atrial fibrillation this  far.  No further concerns at this time.  Denies new or worsening stroke/TIA symptoms.   ROS:   14 system review of systems performed and negative with exception of fatigue, cough, snoring, feeling cold, joint pain, aching muscles, runny nose, memory loss, confusion, weakness, dizziness, anxiety, decreased energy, racing thoughts, sleepiness, snoring and masslike  PMH:  Past Medical History:  Diagnosis Date  . DCIS (ductal carcinoma in situ) of breast    left breast    . Diabetes mellitus   . GERD (gastroesophageal reflux disease)   . High cholesterol   . History of TIA (transient ischemic attack)   . Hypertension   . Stroke [facility])     PSH:  Past Surgical History:  Procedure Laterality Date  . ABDOMINAL HYSTERECTOMY    . BREAST LUMPECTOMY    . CESAREAN SECTION    . LOOP RECORDER INSERTION N/A 08/23/2018   Procedure: LOOP RECORDER INSERTION;  Surgeon: Evans Lance, MD;  Location: [place] CV LAB;  Service: Cardiovascular;  Laterality: N/A;  . TEE WITHOUT CARDIOVERSION N/A 08/23/2018   Procedure: TRANSESOPHAGEAL ECHOCARDIOGRAM (TEE);  Surgeon: Dorothy Spark, MD;  Location: [facility] ENDOSCOPY;  Service: Cardiovascular;  Laterality: N/A;    Social History:  Social History   Socioeconomic History  . Marital status: Widowed    Spouse name: Not on file  . Number of children: Not on file  . Years of education: Not on file  . Highest education level: Not on file  Occupational History  . Not on file  Social Needs  . Financial resource strain: Not on file  . Food insecurity:    Worry: Not on file    Inability: Not on file  . Transportation needs:    Medical: Not on file    Non-medical: Not on file  Tobacco Use  . Smoking status: Never Smoker  . Smokeless tobacco: Never Used  Substance and Sexual Activity  . Alcohol use: No  . Drug use: No  . Sexual activity: Not on file  Lifestyle  . Physical activity:    Days per week: Not on file    Minutes per session: Not on file  . Stress: Not on file  Relationships  . Social connections:    Talks on phone: Not on file    Gets together: Not on file    Attends religious service: Not on file    Active member of club or organization: Not on file    Attends meetings of clubs or organizations: Not on file    Relationship status: Not on file  . Intimate partner violence:    Fear of current or ex partner: Not on file    Emotionally abused: Not on file    Physically abused: Not on file    Forced  sexual activity: Not on file  Other Topics Concern  . Not on file  Social History Narrative  . Not on file    Family History:  Family History  Problem Relation Age of Onset  . Cancer Mother   . Hypertension Father     Medications:   Current Outpatient Medications on File Prior to Visit  Medication Sig Dispense Refill  . aspirin 325 MG tablet Take 1 tablet (325 mg total) by mouth daily.    Marland Kitchen ezetimibe (ZETIA) 10 MG tablet Take 10 mg by mouth daily.   0  . furosemide (LASIX) 20 MG tablet Take 20 mg by mouth daily.     Marland Kitchen lisinopril (PRINIVIL,ZESTRIL) 10 MG  tablet Take 20 mg by mouth daily.     . metoprolol tartrate (LOPRESSOR) 25 MG tablet Take 25 mg by mouth daily.    . rosuvastatin (CRESTOR) 20 MG tablet Take 20 mg by mouth daily.   1  . sertraline (ZOLOFT) 25 MG tablet Take 25 mg by mouth at bedtime.   2  . TOUJEO SOLOSTAR 300 UNIT/ML SOPN Inject 45 Units into the skin daily.   2   No current facility-administered medications on file prior to visit.     Allergies:   Allergies  Allergen Reactions  . Codeine Other (See Comments)    Unknown reaction  . Penicillins Other (See Comments)    Unknown reaction  Has patient had a PCN reaction causing immediate rash, facial/tongue/throat swelling, SOB or lightheadedness with hypotension: Uknown Has patient had a PCN reaction causing severe rash involving mucus membranes or skin necrosis: Uknown Has patient had a PCN reaction that required hospitalization: Unknown Has patient had a PCN reaction occurring within the last 10 years: No If all of the above answers are "NO", then may proceed with Cephalosporin use.     Physical Exam  Vitals:   10/11/18 1454  BP: (!) 125/52  Pulse: 71  Weight: 164 lb (74.4 kg)  Height: 5\' 6"  (1.676 m)   Body mass index is 26.47 kg/m. No exam data present  General: well developed, well nourished, pleasant elderly Caucasian female, seated, in no evident distress Head: head normocephalic and  atraumatic.   Neck: supple with no carotid or supraclavicular bruits Cardiovascular: regular rate and rhythm, no murmurs Musculoskeletal: no deformity Skin:  no rash/petichiae; no evidence of pressure ulcers or sores on her bilateral heels Vascular:  Normal pulses all extremities  Neurologic Exam Mental Status: Awake and fully alert. Oriented to place and time. Recent and remote memory intact. Attention span, concentration and fund of knowledge appropriate. Mood and affect appropriate.  Cranial Nerves: Fundoscopic exam reveals sharp disc margins. Pupils equal, briskly reactive to light. Extraocular movements full without nystagmus. Visual fields full to confrontation. Hearing intact. Facial sensation intact. Face, tongue, palate moves normally and symmetrically.  Motor: Normal bulk and tone.  Left hemiparesis 4/5 strength Sensory.: intact to touch , pinprick , position and vibratory sensation.  Coordination: Rapid alternating movements normal in all extremities. Finger-to-nose and heel-to-shin performed accurately bilaterally. Gait and Station: Arises from chair without difficulty. Stance is normal.  Patient is sitting in a wheelchair and Rollator walker not present at today's appointment therefore gait assessment deferred Reflexes: 1+ and symmetric. Toes downgoing.    NIHSS  0 Modified Rankin  2    Diagnostic Data (Labs, Imaging, Testing)  Mr Jodene Nam Head/brain Wo Cm 08/19/2018 IMPRESSION:  Large, nonhemorrhagic, acute infarction involving RIGHT MCA posterior M2 division territory, with corresponding thrombosis of that vessel on MRA. No significant midline shift or brain swelling at this time. Superimposed chronic ischemia in the brain, and chronic microvascular ischemic change.    Ct Head Code Stroke Wo Contrast 08/19/2018 IMPRESSION:  1. Acute on chronic posterior Right MCA territory infarct. No hemorrhage or mass effect. ASPECTS 5.  2. Superimposed age indeterminate much smaller  posterior Left MCA territory ischemia (series 2, image 16), new since 2017.  3. Underlying chronic small vessel disease and chronic bilateral cerebellar infarcts.    2D Echocardiogram   - Left ventricle: The cavity size was normal. Wall thickness wasincreased in a pattern of moderate LVH. Systolic function wasnormal. The estimated ejection fraction was in the range of  50%to 55%. Wall motion was normal; there were no regional wallmotion abnormalities. Doppler parameters are consistent withabnormal left ventricular relaxation (grade 1 diastolicdysfunction). Doppler parameters are consistent with highventricular filling pressure. - Aortic valve: There was mild regurgitation. Valve area (VTI):2.27 cm^2. Valve area (Vmax): 2.01 cm^2. Valve area (Vmean): 2.14cm^2. - Atrial septum: No defect or patent foramen ovale was identified.   Carotid Doppler   There is 1-39% bilateral ICA stenosis. Vertebral artery flow is antegrade.       ASSESSMENT: YOSHI VICENCIO is a 84 y.o. year old female here with large right MCA infarct embolic on 08/09/10 secondary to unknown source. Vascular risk factors include HTN, HLD, DM, CKD, and prior infarct.     PLAN:  1. Right MCA infarct : Continue aspirin 325 mg daily  and lipitor  for secondary stroke prevention. Will continue to monitor loop recorder. Maintain strict control of hypertension with blood pressure goal below 130/90, diabetes with hemoglobin A1c goal below 6.5% and cholesterol with LDL cholesterol (bad cholesterol) goal below 70 mg/dL.  I also advised the patient to eat a healthy diet with plenty of whole grains, cereals, fruits and vegetables, exercise regularly with at least 30 minutes of continuous activity daily and maintain ideal body weight. 2. HTN: Advised to continue current treatment regimen.  Today's BP 125/52.  Advised to continue to monitor at home along with continued follow-up with PCP for management 3. HLD: Advised to  continue current treatment regimen along with continued follow-up with PCP for future prescribing and monitoring of lipid panel 4. DMII: Advised to continue to monitor glucose levels at home along with continued follow-up with PCP for management and monitoring 5. Do to continued deficits, referral placed for outpatient PT/OT/ST. Due to anxiety concerns, will start Buspar 5mg  BID.    Follow up in 3 months or call earlier if needed   Greater than 50% of time during this 25 minute visit was spent on counseling, explanation of diagnosis of right MCA infarct, reviewing risk factor management of HLD, HTN and DM, planning of further management along with potential future management, and discussion with patient and family answering all questions.    Venancio Poisson, AGNP-BC  [facility] [address] [place], [place] [address]  Phone [phone] Fax [phone] Note: This document was prepared with digital dictation and possible smart phrase technology. Any transcriptional errors that result from this process are unintentional.

## 2018-10-11 NOTE — Patient Instructions (Addendum)
Continue aspirin 325 mg daily  and Zetia and Crestor  for secondary stroke prevention  Start Buspar for increased anxiety post stroke  We will continue to monitor loop recorder   Continue to follow up with PCP regarding cholesterol, blood pressure and diabetes management   Restart PT/OT/ST outpatient - you will be called to schedule appointment   Continue to monitor blood pressure at home  Maintain strict control of hypertension with blood pressure goal below 130/90, diabetes with hemoglobin A1c goal below 6.5% and cholesterol with LDL cholesterol (bad cholesterol) goal below 70 mg/dL. I also advised the patient to eat a healthy diet with plenty of whole grains, cereals, fruits and vegetables, exercise regularly and maintain ideal body weight.  Followup in the future with me in 3 months or call earlier if needed       Thank you for coming to see [place] at [facility]. I hope we have been able to provide you high quality care today.  You may receive a patient satisfaction survey over the next few weeks. We would appreciate your feedback and comments so that we may continue to improve ourselves and the health of our patients.

## 2018-10-19 ENCOUNTER — Ambulatory Visit: Payer: Medicare Other | Admitting: Speech Pathology

## 2018-10-19 ENCOUNTER — Ambulatory Visit: Payer: Medicare Other | Admitting: Occupational Therapy

## 2018-10-21 ENCOUNTER — Telehealth: Payer: Self-pay

## 2018-10-21 NOTE — Telephone Encounter (Signed)
Manual transmission received. Will review with MD.

## 2018-10-21 NOTE — Telephone Encounter (Signed)
Pt's son to send in manual transmission.

## 2018-10-22 ENCOUNTER — Encounter (HOSPITAL_COMMUNITY): Payer: Self-pay

## 2018-10-22 ENCOUNTER — Ambulatory Visit (HOSPITAL_COMMUNITY): Payer: Medicare Other | Attending: Adult Health

## 2018-10-22 ENCOUNTER — Other Ambulatory Visit: Payer: Self-pay

## 2018-10-22 DIAGNOSIS — R2681 Unsteadiness on feet: Secondary | ICD-10-CM | POA: Insufficient documentation

## 2018-10-22 NOTE — Therapy (Signed)
[place], [place], [address] Phone: [phone]   Fax:  [phone]  Physical Therapy Evaluation/One Time Visit  Patient Details  Name: Rebecca Hardy MRN: [id number] Date of Birth: [birth year] Referring Provider (PT): Venancio Poisson, NP   Encounter Date: 10/22/2018  PT End of Session - 10/22/18 1535    Visit Number  1    Number of Visits  1    Date for PT Re-Evaluation  10/22/18    Authorization Type  [employer] Medicare    Authorization Time Period  one time visit    PT Start Time  1500    PT Stop Time  1531    PT Time Calculation (min)  31 min    Activity Tolerance  Patient tolerated treatment well;No increased pain    Behavior During Therapy  WFL for tasks assessed/performed       Past Medical History:  Diagnosis Date  . 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 Diabetes mellitus   . GERD (gastroesophageal reflux disease)   . High cholesterol   . History of TIA (transient ischemic attack)   . Hypertension   . Stroke [facility])     Past Surgical History:  Procedure Laterality Date  . ABDOMINAL HYSTERECTOMY    . BREAST LUMPECTOMY    . CESAREAN SECTION    . LOOP RECORDER INSERTION N/A 08/23/2018   Procedure: LOOP RECORDER INSERTION;  Surgeon: Evans Lance, MD;  Location: [place] CV LAB;  Service: Cardiovascular;  Laterality: N/A;  . TEE WITHOUT CARDIOVERSION N/A 08/23/2018   Procedure: TRANSESOPHAGEAL ECHOCARDIOGRAM (TEE);  Surgeon: Dorothy Spark, MD;  Location: [facility] ENDOSCOPY;  Service: Cardiovascular;  Laterality: N/A;    There were no vitals filed for this visit.   Subjective Assessment - 10/22/18 1506    Subjective  Pt reports having CVA right arond [place]. She had HHPT, OT, and SLP when she was d/c a couple weeks ago form all three. Per chart review, she had large nonhemorrhagic acute infarct R MCA. Her grandson states that she was doing great following the hospital but  then they changed her metroprolol; he called her MD and asked to stop changing that because it caused her legs to give out and she had difficutly walking. Since that has gotten out of her system, the pt and her grandson state that she is almost back to her baseline. She will intermittently use her RW if she feels like she needs it but does not always use it. She reports intermittent compliance with her HEP from HHPT.     Patient is accompained by:  Family member   Grandson   Currently in Pain?  No/denies         [facility] PT Assessment - 10/22/18 0001      Assessment   Medical Diagnosis  Cerebrovascular accident (CVA) due to embolism of right middle cerebral artery     Referring Provider (PT)  Venancio Poisson, NP    Onset Date/Surgical Date  08/19/18    Next MD Visit  f/u with neurologist and cardiologist in January      [place]   Has the patient fallen in the past 6 months  Yes    How many times?  1 fall before the CVA    Has the patient had a decrease in activity level because of a fear of falling?   No    Is the patient reluctant to  leave their home because of a fear of falling?   No      Prior Function   Level of Independence  Independent with household mobility without device;Independent with community mobility with device   grandson lives with her full time and cooks, cleans for her   Vocation  Retired      Observation/Other Assessments   Focus on Therapeutic Outcomes (FOTO)   n/a - one time visit      ROM / Strength   AROM / PROM / Strength  Strength      Strength   Strength Assessment Site  Hip;Knee;Ankle    Right Hip Flexion  4+/5    Left Hip Flexion  4+/5    Right Knee Extension  4+/5    Left Knee Extension  4+/5    Right Ankle Dorsiflexion  5/5    Left Ankle Dorsiflexion  5/5      Ambulation/Gait   Ambulation Distance (Feet)  226 Feet    Assistive device  None    Gait Comments  intermittent staggering R/L but maintained balance independently       Balance   Balance Assessed  Yes      Static Standing Balance   Static Standing - Balance Support  No upper extremity supported    Static Standing Balance -  Activities   Single Leg Stance - Right Leg;Single Leg Stance - Left Leg    Static Standing - Comment/# of Minutes  R: 5sec L: 6 sec      Standardized Balance Assessment   Standardized Balance Assessment  --           Objective measurements completed on examination: See above findings.         PT Education - 10/22/18 1534    Education Details  exam findings, HEP    Person(s) Educated  Patient;Caregiver(s)   Grandson   Methods  Explanation;Handout    Comprehension  Verbalized understanding;Returned demonstration       PT Short Term Goals - 10/22/18 1546      PT SHORT TERM GOAL #1   Title  Pt and her caregiver will verbalize understanding of HEP.    Time  1    Period  Days    Status  Achieved        PT Long Term Goals - 10/22/18 1546      PT LONG TERM GOAL #1   Title  n/a - one time visit             Plan - 10/22/18 1545    Clinical Impression Statement  Pt is pleasant 85YO F who presents to OPPT s/p R MCA acute infarct around 08/19/18. Her grandson was present during eval and provided supplemental history. He states that the pt was taking metroprolol when she got home and it was causing her to have increased weakness and difficulty walking due to her legs giving out. He cleared it with the MD to take her off of it and he said that once that medication was out of her system, her mobility and strength have been fine. PT assessed her MMT, balance, and gait this date. Her MMT was 4+ to 5/5 throughout BLE, she could perform bil SLS for 5 sec on RLE and 6 sec on LLE, and while she intermittently staggered, she maintained her balance independently without AD. Pt and her grandson express that she is at her baseline level of function and feel she does not need skilled PT intervention. PT  provided strengthening HEP  for pt to perform for her to maintain progress made and to further improve her balance and gait. Her grandson lives with her full-time and states that he can assist her with her HEP and ensure her safety when performing. They were educated that she could return with referral if balance or strength deficits worsened and they verbalized understanding.     Clinical Presentation  Stable    Clinical Decision Making  Low    PT Frequency  One time visit       Patient will benefit from skilled therapeutic intervention in order to improve the following deficits and impairments:     Visit Diagnosis: Unsteadiness on feet - Plan: PT plan of care cert/re-cert     Problem List Patient Active Problem List   Diagnosis Date Noted  . Acute ischemic cerebrovascular accident (CVA) involving right middle cerebral artery territory ([place]) 08/20/2018  . Narrow complex tachycardia ([place]) 08/20/2018  . Left-sided weakness 08/19/2018  . HTN (hypertension) 08/19/2018  . HLD (hyperlipidemia) 08/19/2018  . CKD (chronic kidney disease) stage 3, GFR 30-59 ml/min (H[facility]) 08/19/2018  . Type 2 diabetes mellitus with hyperlipidemia ([place]) 08/19/2018        Geraldine Solar PT, DPT  [facility] [address] [place], [place], [address] Phone: [phone]   Fax:  [phone]  Name: Rebecca Hardy MRN: [id number] Date of Birth: 28-Oct-1932

## 2018-10-22 NOTE — Telephone Encounter (Signed)
Dr. Lovena Le reviewed patient's "tachy" episodes from 12/26-1/2. Per Dr. Lovena Le, plan to bring patient in to the DC to reprogram tachy detection to 135bpm to help determine tachy episode onset.   Spoke with patient's grandson, Public librarian ([place]). He is agreeable to a DC appointment on 11/05/18 at 11:00am. He agrees to send weekly manual transmissions for review in the interim. He denies additional questions or concerns at this time.

## 2018-10-24 DIAGNOSIS — I639 Cerebral infarction, unspecified: Secondary | ICD-10-CM | POA: Diagnosis not present

## 2018-10-24 DIAGNOSIS — I471 Supraventricular tachycardia: Secondary | ICD-10-CM | POA: Diagnosis not present

## 2018-10-24 NOTE — Progress Notes (Signed)
I agree with the above plan 

## 2018-10-28 ENCOUNTER — Ambulatory Visit (INDEPENDENT_AMBULATORY_CARE_PROVIDER_SITE_OTHER): Payer: Medicare Other

## 2018-10-28 DIAGNOSIS — I63511 Cerebral infarction due to unspecified occlusion or stenosis of right middle cerebral artery: Secondary | ICD-10-CM

## 2018-10-29 NOTE — Progress Notes (Signed)
Carelink Summary Report / Loop Recorder 

## 2018-10-30 DIAGNOSIS — J060 Acute laryngopharyngitis: Secondary | ICD-10-CM | POA: Diagnosis not present

## 2018-10-30 LAB — CUP PACEART REMOTE DEVICE CHECK
Date Time Interrogation Session: 20200109173639
Implantable Pulse Generator Implant Date: 20191104

## 2018-11-03 ENCOUNTER — Ambulatory Visit: Payer: Medicare Other | Admitting: Physical Therapy

## 2018-11-05 ENCOUNTER — Ambulatory Visit (INDEPENDENT_AMBULATORY_CARE_PROVIDER_SITE_OTHER): Payer: Medicare Other | Admitting: *Deleted

## 2018-11-05 DIAGNOSIS — I63511 Cerebral infarction due to unspecified occlusion or stenosis of right middle cerebral artery: Secondary | ICD-10-CM

## 2018-11-05 LAB — CUP PACEART INCLINIC DEVICE CHECK
Date Time Interrogation Session: 20200117161817
Implantable Pulse Generator Implant Date: 20191104

## 2018-11-05 NOTE — Progress Notes (Signed)
Loop check in clinic for tachy detection rate reprogramming. Battery status: good. R-waves 0.29mV. No symptom, pause, brady, or AF episodes. 30 tachy episodes--ECGs previously reviewed by GT and appear ST vs SVT vs ? A-flutter, median V rates 150-154bpm, longest 106min. Per GT order, lowered tachy detection rate to 133bpm to help determine episode onset and clarify rhythm. Monthly summary reports and ROV with GT PRN.  Per grandson, patient is not currently taking metoprolol tartrate or metoprolol succinate. She felt weak and fatigued with metoprolol tartrate, so they stopped it and her symptoms improved. Dr. Nevada Crane (PCP) recently prescribed metoprolol succinate (as per med list), but patient receives her meds in blister packs from the pharmacy and she has not yet started the new packs. Her grandson estimates she will start those in the next week or two. He agrees to contacting Dr. Nevada Crane for recommendations prior to starting/stopping any meds in the future.

## 2018-11-07 LAB — CUP PACEART REMOTE DEVICE CHECK
Date Time Interrogation Session: 20191207173904
Implantable Pulse Generator Implant Date: 20191104

## 2018-11-09 DIAGNOSIS — R05 Cough: Secondary | ICD-10-CM | POA: Diagnosis not present

## 2018-11-09 DIAGNOSIS — N183 Chronic kidney disease, stage 3 (moderate): Secondary | ICD-10-CM | POA: Diagnosis not present

## 2018-11-09 DIAGNOSIS — E1122 Type 2 diabetes mellitus with diabetic chronic kidney disease: Secondary | ICD-10-CM | POA: Diagnosis not present

## 2018-11-09 DIAGNOSIS — J060 Acute laryngopharyngitis: Secondary | ICD-10-CM | POA: Diagnosis not present

## 2018-11-12 DIAGNOSIS — B351 Tinea unguium: Secondary | ICD-10-CM | POA: Diagnosis not present

## 2018-11-12 DIAGNOSIS — E1142 Type 2 diabetes mellitus with diabetic polyneuropathy: Secondary | ICD-10-CM | POA: Diagnosis not present

## 2018-11-24 DIAGNOSIS — I471 Supraventricular tachycardia: Secondary | ICD-10-CM | POA: Diagnosis not present

## 2018-11-24 DIAGNOSIS — I639 Cerebral infarction, unspecified: Secondary | ICD-10-CM | POA: Diagnosis not present

## 2018-11-30 ENCOUNTER — Ambulatory Visit (INDEPENDENT_AMBULATORY_CARE_PROVIDER_SITE_OTHER): Payer: Medicare Other

## 2018-11-30 DIAGNOSIS — I63511 Cerebral infarction due to unspecified occlusion or stenosis of right middle cerebral artery: Secondary | ICD-10-CM | POA: Diagnosis not present

## 2018-11-30 LAB — CUP PACEART REMOTE DEVICE CHECK
Date Time Interrogation Session: 20200211174115
MDC IDC PG IMPLANT DT: 20191104

## 2018-12-04 DIAGNOSIS — E119 Type 2 diabetes mellitus without complications: Secondary | ICD-10-CM | POA: Diagnosis not present

## 2018-12-04 DIAGNOSIS — Z794 Long term (current) use of insulin: Secondary | ICD-10-CM | POA: Diagnosis not present

## 2018-12-09 DIAGNOSIS — E1122 Type 2 diabetes mellitus with diabetic chronic kidney disease: Secondary | ICD-10-CM | POA: Diagnosis not present

## 2018-12-09 DIAGNOSIS — R600 Localized edema: Secondary | ICD-10-CM | POA: Diagnosis not present

## 2018-12-09 DIAGNOSIS — G3184 Mild cognitive impairment, so stated: Secondary | ICD-10-CM | POA: Diagnosis not present

## 2018-12-09 DIAGNOSIS — N183 Chronic kidney disease, stage 3 (moderate): Secondary | ICD-10-CM | POA: Diagnosis not present

## 2018-12-13 NOTE — Progress Notes (Signed)
Carelink Summary Report / Loop Recorder 

## 2018-12-14 ENCOUNTER — Telehealth: Payer: Self-pay | Admitting: Cardiology

## 2018-12-14 DIAGNOSIS — I471 Supraventricular tachycardia: Secondary | ICD-10-CM

## 2018-12-14 NOTE — Telephone Encounter (Signed)
Confirmed with pharmacy that Toprol XL 25mg  is in patient's pill packs.

## 2018-12-14 NOTE — Telephone Encounter (Signed)
DPR on file to speak w/ daughter  Hulen Skains and spoke w/ pt daughter and requested that pt send a manual transmission w/ the home monitor. Pt daughter stated that she would do that today. I asked pt daughter if she was taking her Toprol XL. Pt daughter stated that pt meds are prepackaged so she is not 100% sure. I asked her how the patient is feeling. She stated that patient has been fine. No shortness of breath, no chest pain, no feelings of passing out. Informed her that once the transmission is received a Device Tech RN will review an call her back if anything is abnormal. Otherwise, no news is good. Pt daughter verbalized understanding.

## 2018-12-15 NOTE — Telephone Encounter (Signed)
Per Dr. Lovena Le, plan to increase metoprolol succinate to 50mg  daily if BP allows for SVT episodes noted on LINQ. Follow-up in 6 weeks with Dr. Lovena Le.  Attempted to reach patient. No answer, phone rang continuously, no answering machine.

## 2018-12-16 MED ORDER — METOPROLOL SUCCINATE ER 50 MG PO TB24: 50.0000 mg | ORAL_TABLET | Freq: Every day | ORAL | 11 refills | Status: DC

## 2018-12-16 NOTE — Telephone Encounter (Signed)
Spoke with Rebecca Hardy and Rebecca Hardy, patient's daughters. Per [facility] handles pt's meds.  Advised Robin of Dr. Tanna Furry recommendations to increase metoprolol succinate to 50mg  daily. Robin verbalizes understanding. Prescription sent electronically to preferred pharmacy. Pt does not have a way to check her BP at home. Educated about signs/symptoms of hypotension and encouraged them to call our office if any noted. 6 week f/u scheduled with Dr. Lovena Le in [place] on 01/25/19 at 9:45am. Rebecca Hardy denies additional questions or concerns at this time and thanked me for my call.

## 2018-12-23 DIAGNOSIS — I639 Cerebral infarction, unspecified: Secondary | ICD-10-CM | POA: Diagnosis not present

## 2018-12-23 DIAGNOSIS — I471 Supraventricular tachycardia: Secondary | ICD-10-CM | POA: Diagnosis not present

## 2018-12-24 ENCOUNTER — Other Ambulatory Visit: Payer: Self-pay | Admitting: Internal Medicine

## 2019-01-03 ENCOUNTER — Other Ambulatory Visit: Payer: Self-pay

## 2019-01-03 ENCOUNTER — Ambulatory Visit (INDEPENDENT_AMBULATORY_CARE_PROVIDER_SITE_OTHER): Payer: Medicare Other | Admitting: *Deleted

## 2019-01-03 DIAGNOSIS — I63511 Cerebral infarction due to unspecified occlusion or stenosis of right middle cerebral artery: Secondary | ICD-10-CM | POA: Diagnosis not present

## 2019-01-04 ENCOUNTER — Other Ambulatory Visit: Payer: Self-pay | Admitting: Internal Medicine

## 2019-01-06 LAB — CUP PACEART REMOTE DEVICE CHECK
Date Time Interrogation Session: 20200318040500
Implantable Pulse Generator Implant Date: 20191104

## 2019-01-10 ENCOUNTER — Telehealth: Payer: Self-pay | Admitting: Cardiology

## 2019-01-10 ENCOUNTER — Telehealth: Payer: Self-pay

## 2019-01-10 ENCOUNTER — Ambulatory Visit: Payer: Medicare Other | Admitting: Adult Health

## 2019-01-10 NOTE — Telephone Encounter (Signed)
Spoke w/ pt daughter and requested that pt send a manual transmission w/ her home monitor. Instructed pt daughter how to send a manual transmission. Transmission received.

## 2019-01-10 NOTE — Telephone Encounter (Signed)
Left vm for pts daughter Rebecca Hardy to request telephone visit with pt. I stated on vm we need consent from pt. Tried to call pts phone but it did not rang.

## 2019-01-11 NOTE — Progress Notes (Signed)
Carelink Summary Report / Loop Recorder 

## 2019-01-11 NOTE — Telephone Encounter (Signed)
Manual transmission sent in as requested by clinic. 4 tachy episodes appear SVT, similar to previous episodes. Avg V rates somewhat improved since metoprolol increased on 12/16/18. Will reassess at upcoming visit with Dr. Lovena Le.

## 2019-01-12 ENCOUNTER — Ambulatory Visit (INDEPENDENT_AMBULATORY_CARE_PROVIDER_SITE_OTHER): Payer: Medicare Other | Admitting: Adult Health

## 2019-01-12 ENCOUNTER — Other Ambulatory Visit: Payer: Self-pay

## 2019-01-12 ENCOUNTER — Encounter: Payer: Self-pay | Admitting: Adult Health

## 2019-01-12 DIAGNOSIS — E785 Hyperlipidemia, unspecified: Secondary | ICD-10-CM

## 2019-01-12 DIAGNOSIS — I63411 Cerebral infarction due to embolism of right middle cerebral artery: Secondary | ICD-10-CM | POA: Diagnosis not present

## 2019-01-12 DIAGNOSIS — E1169 Type 2 diabetes mellitus with other specified complication: Secondary | ICD-10-CM | POA: Diagnosis not present

## 2019-01-12 DIAGNOSIS — I10 Essential (primary) hypertension: Secondary | ICD-10-CM | POA: Diagnosis not present

## 2019-01-12 NOTE — Progress Notes (Signed)
[facility] [address] [place]. [place] [address] [phone]     Virtual Visit via Telephone Note  I connected with Rebecca Hardy on 01/12/19 at  1:45 PM EDT by telephone located at [facility] and verified that I am speaking with the correct person using two identifiers who reports being located in her home and accompanied by her daughter, Rebecca Hardy.    I discussed the limitations, risks, security and privacy concerns of performing an evaluation and management service by telephone and the availability of in person appointments. I also discussed with the patient that there may be a patient responsible charge related to this service. The patient expressed understanding and agreed to proceed.   History of Present Illness:  Rebecca Hardy is a 85 y.o. female who is being followed in this office after right MCA infarct in 07/2018.  She was initially scheduled for face-to-face office visit today at this time but due to [place], face-to-face office visit rescheduled for non-face-to-face telephone visit.   Please see prior visit note on 10/11/2018 for additional past information.  She states she has been doing well since prior visit and is ambulating without assistive device without any recent falls.  She denies any residual stroke weakness or any other deficits.  She feels as though her memory has been stable and slightly improved since prior visit.  She is currently living on her own as her grandson is currently out of state for work but her daughter checks on her daily and assist with any ADLs or IADLs such as medication administration.  She continues on aspirin and Crestor without reported side effects.  She does have a nurse that comes into her home 1 time monthly for routine assessment and checkup where BP is monitored and has been stable.  Loop recorder has not shown atrial fibrillation thus far.  No further concerns at this time.  Medication list  reviewed and updated and denies any changes in medical history or allergies.  Denies new or worsening stroke/TIA symptoms.    Observations/Objective:  Pleasant elderly Caucasian female with mild cognitive impairment but able to answer and ask questions appropriately  Unable to obtain vital signs or physical assessment  Assessment and Plan:  Rebecca Hardy is a 85 year old female who continues to be followed in this office after right MCA infarct in 07/2018.  She continues to do well from a stroke standpoint without residual deficits or reoccurring of symptoms.  1. Right MCA infarct: Continue aspirin 325 mg daily  and Crestor for secondary stroke prevention. Maintain strict control of hypertension with blood pressure goal below 130/90, diabetes with hemoglobin A1c goal below 6.5% and cholesterol with LDL cholesterol (bad cholesterol) goal below 70 mg/dL.  I also advised the patient to eat a healthy diet with plenty of whole grains, cereals, fruits and vegetables, exercise regularly with at least 30 minutes of continuous activity daily and maintain ideal body weight. 2. HTN: Advised to continue current treatment regimen.  Advised to continue to monitor at home along with continued follow-up with PCP for management 3. HLD: Advised to continue current treatment regimen along with continued follow-up with PCP for future prescribing and monitoring of lipid panel 4. DMII: Advised to continue to monitor glucose levels at home along with continued follow-up with PCP for management and monitoring   Follow Up Instructions:   Follow-up in 6 months or call earlier if needed    I discussed the assessment and treatment plan with the patient.  The patient was provided an opportunity to ask questions and all were answered to their satisfaction. The patient agreed with the plan and verbalized an understanding of the instructions.   I provided 20 minutes of non-face-to-face time during this encounter.     Venancio Poisson, AGNP-BC  [facility] [address] [place],  [address]  Phone [phone] Fax [phone] Note: This document was prepared with digital dictation and possible smart phrase technology. Any transcriptional errors that result from this process are unintentional.

## 2019-01-12 NOTE — Progress Notes (Signed)
I agree with the above plan 

## 2019-01-23 DIAGNOSIS — I471 Supraventricular tachycardia: Secondary | ICD-10-CM | POA: Diagnosis not present

## 2019-01-23 DIAGNOSIS — I639 Cerebral infarction, unspecified: Secondary | ICD-10-CM | POA: Diagnosis not present

## 2019-01-24 DIAGNOSIS — I10 Essential (primary) hypertension: Secondary | ICD-10-CM | POA: Diagnosis not present

## 2019-01-24 DIAGNOSIS — E1122 Type 2 diabetes mellitus with diabetic chronic kidney disease: Secondary | ICD-10-CM | POA: Diagnosis not present

## 2019-01-24 DIAGNOSIS — E1165 Type 2 diabetes mellitus with hyperglycemia: Secondary | ICD-10-CM | POA: Diagnosis not present

## 2019-01-24 DIAGNOSIS — N183 Chronic kidney disease, stage 3 (moderate): Secondary | ICD-10-CM | POA: Diagnosis not present

## 2019-01-25 ENCOUNTER — Telehealth (INDEPENDENT_AMBULATORY_CARE_PROVIDER_SITE_OTHER): Payer: Medicare Other | Admitting: Internal Medicine

## 2019-01-25 DIAGNOSIS — I471 Supraventricular tachycardia: Secondary | ICD-10-CM

## 2019-01-25 DIAGNOSIS — I63511 Cerebral infarction due to unspecified occlusion or stenosis of right middle cerebral artery: Secondary | ICD-10-CM

## 2019-01-25 NOTE — Patient Instructions (Signed)
Medication Instructions:  Your physician recommends that you continue on your current medications as directed. Please refer to the Current Medication list given to you today.   Labwork: NONE  Testing/Procedures: NONE   Follow-Up: Your physician recommends that you schedule a follow-up appointment in: 6 Months with Dr. Lovena Le  Remote monitoring is used to monitor your Pacemaker of ICD from home. This monitoring reduces the number of office visits required to check your device to one time per year. It allows [place] to keep an eye on the functioning of your device to ensure it is working properly. You are scheduled for a device check from home on 02/07/19. You may send your transmission at any time that day. If you have a wireless device, the transmission will be sent automatically. After your physician reviews your transmission, you will receive a postcard with your next transmission date.   Any Other Special Instructions Will Be Listed Below (If Applicable).     If you need a refill on your cardiac medications before your next appointment, please call your pharmacy. Thank you for choosing [facility]!

## 2019-01-25 NOTE — Progress Notes (Signed)
Electrophysiology TeleHealth Note   Due to national recommendations of social distancing due to COVID 19, an audio/video telehealth visit is felt to be most appropriate for this patient at this time.  See MyChart message from today for the patient's consent to telehealth for [facility].   Date:  01/25/2019   ID:  Rebecca Hardy, DOB 20-Jun-1933, MRN [id number]  Location: patient's home  Provider location: [address], [place]  Evaluation Performed: Follow-up visit  PCP:  Celene Squibb, MD  Cardiologist:  No primary care provider on file.  Electrophysiologist:  Dr Lovena Le  Chief Complaint:  "I'm doing ok"  History of Present Illness:    Rebecca Hardy is a 85 y.o. female who presents via audio/video conferencing for a telehealth visit today.  Since last being seen in our clinic, the patient reports doing very well. She could not perform the video portion due to technical difficulties.  Today, she denies symptoms of palpitations, chest pain, shortness of breath, dizziness, presyncope, or syncope.  She has some lower extremity edema.The patient is otherwise without complaint today.  The patient denies symptoms of fevers, chills, cough, or new SOB worrisome for COVID 19.  Past Medical History:  Diagnosis Date  . 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 Diabetes mellitus   . GERD (gastroesophageal reflux disease)   . High cholesterol   . History of TIA (transient ischemic attack)   . Hypertension   . Stroke [facility])     Past Surgical History:  Procedure Laterality Date  . ABDOMINAL HYSTERECTOMY    . BREAST LUMPECTOMY    . CESAREAN SECTION    . LOOP RECORDER INSERTION N/A 08/23/2018   Procedure: LOOP RECORDER INSERTION;  Surgeon: Evans Lance, MD;  Location: [facility] CV LAB;  Service: Cardiovascular;  Laterality: N/A;  . TEE WITHOUT CARDIOVERSION N/A 08/23/2018   Procedure: TRANSESOPHAGEAL ECHOCARDIOGRAM (TEE);  Surgeon: Dorothy Spark, MD;  Location: [facility] ENDOSCOPY;  Service: Cardiovascular;  Laterality: N/A;    Current Outpatient Medications  Medication Sig Dispense Refill  . aspirin 325 MG tablet Take 1 tablet (325 mg total) by mouth daily.    Marland Kitchen ezetimibe (ZETIA) 10 MG tablet Take 10 mg by mouth daily.   0  . furosemide (LASIX) 40 MG tablet Take 40 mg by mouth daily.    Marland Kitchen lisinopril (PRINIVIL,ZESTRIL) 20 MG tablet     . metoprolol succinate (TOPROL-XL) 25 MG 24 hr tablet Take 25 mg by mouth daily.    . rosuvastatin (CRESTOR) 20 MG tablet Take 20 mg by mouth daily.   1  . sertraline (ZOLOFT) 25 MG tablet Take 25 mg by mouth at bedtime.   2  . TOUJEO SOLOSTAR 300 UNIT/ML SOPN Inject 45 Units into the skin daily.   2   No current facility-administered medications for this visit.     Allergies:   Codeine and Penicillins   Social History:  The patient  reports that she has never smoked. She has never used smokeless tobacco. She reports that she does not drink alcohol or use drugs.   Family History:  The patient's family history includes Cancer in her mother; Hypertension in her father.   ROS:  Please see the history of present illness.   All other systems are personally reviewed and negative.    Exam:    Vital Signs:  There were no vitals taken for this visit.  Well appearing,  alert and conversant, regular work of breathing,  good skin color Eyes- anicteric, neuro- grossly intact, skin- no apparent rash or lesions or cyanosis, mouth- oral mucosa is pink   Labs/Other Tests and Data Reviewed:    Recent Labs: 10/03/2018: ALT 19; BUN 47; Creatinine, Ser 1.36; Hemoglobin 12.7; Magnesium 1.9; Platelets 177; Potassium 4.1; Sodium 135   Wt Readings from Last 3 Encounters:  10/11/18 164 lb (74.4 kg)  10/03/18 182 lb (82.6 kg)  08/19/18 182 lb (82.6 kg)     Other studies personally reviewed:  Last device remote is reviewed from [place] PDF dated 3/19 which reveals normal device function, no arrhythmias except for  a brief episode of SVT (?atrial tachy)   ASSESSMENT & PLAN:    1.  Cryptogenic stroke - she has had no atrial fib. She will undergo watchful waiting and continue 2. COVID 19 screen The patient denies symptoms of COVID 19 at this time.  The importance of social distancing was discussed today.  Follow-up:  6 months Next remote: 4/20  Current medicines are reviewed at length with the patient today.   The patient does not have concerns regarding her medicines.  The following changes were made today:  none  Labs/ tests ordered today include: none No orders of the defined types were placed in this encounter.    Patient Risk:  after full review of this patients clinical status, I feel that they are at moderate risk at this time.  Today, I have spent 15 minutes with the patient with telehealth technology discussing the above problems. Loma Messing, MD  01/25/2019 9:45 AM     [facility] [address] [place]  [address] [phone] (office) [phone] (fax)

## 2019-02-04 ENCOUNTER — Other Ambulatory Visit: Payer: Self-pay

## 2019-02-04 ENCOUNTER — Ambulatory Visit (INDEPENDENT_AMBULATORY_CARE_PROVIDER_SITE_OTHER): Payer: Medicare Other | Admitting: *Deleted

## 2019-02-04 DIAGNOSIS — I471 Supraventricular tachycardia: Secondary | ICD-10-CM

## 2019-02-04 DIAGNOSIS — I63511 Cerebral infarction due to unspecified occlusion or stenosis of right middle cerebral artery: Secondary | ICD-10-CM | POA: Diagnosis not present

## 2019-02-04 LAB — CUP PACEART REMOTE DEVICE CHECK
Date Time Interrogation Session: 20200417165051
Implantable Pulse Generator Implant Date: 20191104

## 2019-02-09 NOTE — Progress Notes (Signed)
Carelink Summary Report / Loop Recorder 

## 2019-02-11 DIAGNOSIS — Z0000 Encounter for general adult medical examination without abnormal findings: Secondary | ICD-10-CM | POA: Diagnosis not present

## 2019-02-22 DIAGNOSIS — I471 Supraventricular tachycardia: Secondary | ICD-10-CM | POA: Diagnosis not present

## 2019-02-22 DIAGNOSIS — I639 Cerebral infarction, unspecified: Secondary | ICD-10-CM | POA: Diagnosis not present

## 2019-03-04 DIAGNOSIS — E119 Type 2 diabetes mellitus without complications: Secondary | ICD-10-CM | POA: Diagnosis not present

## 2019-03-04 DIAGNOSIS — Z794 Long term (current) use of insulin: Secondary | ICD-10-CM | POA: Diagnosis not present

## 2019-03-09 ENCOUNTER — Ambulatory Visit (INDEPENDENT_AMBULATORY_CARE_PROVIDER_SITE_OTHER): Payer: Medicare Other | Admitting: *Deleted

## 2019-03-09 DIAGNOSIS — I63511 Cerebral infarction due to unspecified occlusion or stenosis of right middle cerebral artery: Secondary | ICD-10-CM | POA: Diagnosis not present

## 2019-03-10 LAB — CUP PACEART REMOTE DEVICE CHECK
Date Time Interrogation Session: 20200520183930
Implantable Pulse Generator Implant Date: 20191104

## 2019-03-18 NOTE — Progress Notes (Signed)
Carelink Summary Report / Loop Recorder 

## 2019-03-21 ENCOUNTER — Telehealth: Payer: Self-pay | Admitting: Cardiology

## 2019-03-21 NOTE — Telephone Encounter (Signed)
Transmission interpretation was multiple episodes of ST/SVT similar to previous transmissions.   Toprol-XL dose was decreased to 25 mg daily by neurologist in 12/2018.Family reports med change was due to extreme weakness because pt was so weak she was unable to walk on Toprol-XL 50 mg dose.

## 2019-03-21 NOTE — Telephone Encounter (Signed)
Spoke w/ pt and her daughter and requested that she send a manual transmission. Transmission received.

## 2019-03-25 DIAGNOSIS — I471 Supraventricular tachycardia: Secondary | ICD-10-CM | POA: Diagnosis not present

## 2019-03-25 DIAGNOSIS — I639 Cerebral infarction, unspecified: Secondary | ICD-10-CM | POA: Diagnosis not present

## 2019-03-28 ENCOUNTER — Telehealth: Payer: Self-pay

## 2019-03-28 NOTE — Telephone Encounter (Signed)
I called pt to get her to send a manual transmission with her home monitor. No answer/no voicemail.

## 2019-03-29 NOTE — Telephone Encounter (Signed)
I spoke with the pt daughter and she sent the transmission for the pt. Transmission received 03/29/2019. I told her if the nurse see anything she will call her back. If everything looks normal she will not receive a phone call. The best number to call the pt daughter is  [phone]

## 2019-03-30 NOTE — Telephone Encounter (Signed)
Discussed with pt daughter. Pt DOES feel better with decreased dose of Toprol, but is having SVT/AT more often.  Declines re-increase of BB at this time.  Will see in Device clinic next week to change alarm settings of her LINQ.   Pt answered negative to all COVID screening questions.    Legrand Como [address] [place], PA-C 03/30/2019 10:39 AM

## 2019-04-03 NOTE — Telephone Encounter (Signed)
Dose reduction is reasonable. GT

## 2019-04-11 ENCOUNTER — Encounter: Payer: Medicare Other | Admitting: *Deleted

## 2019-04-12 DIAGNOSIS — G4459 Other complicated headache syndrome: Secondary | ICD-10-CM | POA: Diagnosis not present

## 2019-04-15 ENCOUNTER — Telehealth: Payer: Self-pay

## 2019-04-15 NOTE — Telephone Encounter (Signed)
Spoke with patient to remind of missed remote transmission 

## 2019-04-20 ENCOUNTER — Telehealth: Payer: Self-pay

## 2019-04-20 NOTE — Telephone Encounter (Signed)
Appointment 04-21-2019 at 2pm.      COVID-19 Pre-Screening Questions:  . In the past 7 to 10 days have you had a cough,  shortness of breath, headache, congestion, fever (100 or greater) body aches, chills, sore throat, or sudden loss of taste or sense of smell? A migraine but she is fine doctor gave her a shot. No . Have you been around anyone with known Covid 19. No . Have you been around anyone who is awaiting Covid 19 test results in the past 7 to 10 days? No . Have you been around anyone who has been exposed to Covid 19, or has mentioned symptoms of Covid 19 within the past 7 to 10 days? No  If you have any concerns/questions about symptoms patients report during screening (either on the phone or at threshold). Contact the provider seeing the patient or DOD for further guidance.  If neither are available contact a member of the leadership team.         Pt daughter answered No to all Covid-19 prescreening questions. Pt daughter had me reschedule the appointment for 04-26-2019 at 10:30 am. I did tell the pt daughter that the pt will need to wear a mask for the appointment. I also told her if the pt can physically come alone to please do so because we are limiting the amt of people coming into the office. Pt daughter states that she can not walk long by herself and will need help. I told her if the pt physically needs help that 1 person can come to the appointment and will also need to wear a mask. Pt daughter verbalized understanding.

## 2019-04-24 DIAGNOSIS — I639 Cerebral infarction, unspecified: Secondary | ICD-10-CM | POA: Diagnosis not present

## 2019-04-24 DIAGNOSIS — I471 Supraventricular tachycardia: Secondary | ICD-10-CM | POA: Diagnosis not present

## 2019-04-25 DIAGNOSIS — N183 Chronic kidney disease, stage 3 (moderate): Secondary | ICD-10-CM | POA: Diagnosis not present

## 2019-04-25 DIAGNOSIS — E1122 Type 2 diabetes mellitus with diabetic chronic kidney disease: Secondary | ICD-10-CM | POA: Diagnosis not present

## 2019-04-25 DIAGNOSIS — I10 Essential (primary) hypertension: Secondary | ICD-10-CM | POA: Diagnosis not present

## 2019-04-25 DIAGNOSIS — E1165 Type 2 diabetes mellitus with hyperglycemia: Secondary | ICD-10-CM | POA: Diagnosis not present

## 2019-04-25 DIAGNOSIS — E785 Hyperlipidemia, unspecified: Secondary | ICD-10-CM | POA: Diagnosis not present

## 2019-04-25 DIAGNOSIS — E663 Overweight: Secondary | ICD-10-CM | POA: Diagnosis not present

## 2019-05-15 LAB — CUP PACEART REMOTE DEVICE CHECK
Date Time Interrogation Session: 20200725204053
Implantable Pulse Generator Implant Date: 20191104

## 2019-05-16 ENCOUNTER — Ambulatory Visit (INDEPENDENT_AMBULATORY_CARE_PROVIDER_SITE_OTHER): Payer: Medicare Other | Admitting: *Deleted

## 2019-05-16 ENCOUNTER — Telehealth: Payer: Self-pay

## 2019-05-16 DIAGNOSIS — I63511 Cerebral infarction due to unspecified occlusion or stenosis of right middle cerebral artery: Secondary | ICD-10-CM

## 2019-05-16 NOTE — Telephone Encounter (Signed)
Called to request manual transmission d/t alert received for  17 tachy events. Pt states she needs help sending manual transmission, she is unsure when her daughter will be back at her house. Pt requested call back this afternoon.

## 2019-05-17 NOTE — Telephone Encounter (Signed)
I called pt for a transmission with her home monitor. I got an answering service asking for a remote code.

## 2019-05-18 NOTE — Telephone Encounter (Signed)
I spoke with the pt and she do not know how to send a transmission. She asked me to call her daughter Shirlean Mylar.  I Left a voicemail on Robin phone to send a transmission with the pt home monitor and I also left my direct office number if she have any questions.

## 2019-05-19 NOTE — Telephone Encounter (Signed)
I spoke with the pt daughter and she agreed to send a transmission later today.

## 2019-05-23 NOTE — Telephone Encounter (Signed)
LMOVM for pt daughter to call DC back to schedule DC appointment for reprogramming.

## 2019-05-25 DIAGNOSIS — I639 Cerebral infarction, unspecified: Secondary | ICD-10-CM | POA: Diagnosis not present

## 2019-05-25 DIAGNOSIS — I471 Supraventricular tachycardia: Secondary | ICD-10-CM | POA: Diagnosis not present

## 2019-05-25 NOTE — Telephone Encounter (Signed)
Forwarded to scheduler for assistance rescheduling missed DC appointment.

## 2019-06-02 NOTE — Progress Notes (Signed)
Carelink Summary Report / Loop Recorder 

## 2019-06-16 ENCOUNTER — Ambulatory Visit (INDEPENDENT_AMBULATORY_CARE_PROVIDER_SITE_OTHER): Payer: Medicare Other | Admitting: *Deleted

## 2019-06-16 DIAGNOSIS — I639 Cerebral infarction, unspecified: Secondary | ICD-10-CM

## 2019-06-17 LAB — CUP PACEART REMOTE DEVICE CHECK
Date Time Interrogation Session: 20200827214019
Implantable Pulse Generator Implant Date: 20191104

## 2019-06-23 NOTE — Progress Notes (Signed)
Carelink Summary Report / Loop Recorder 

## 2019-07-11 ENCOUNTER — Telehealth: Payer: Self-pay | Admitting: *Deleted

## 2019-07-11 NOTE — Telephone Encounter (Signed)
Spoke with Judson Roch, patient's sitter, to schedule DC appointment to increase tachy detection rate due to frequent ST/SVT episodes. Pt historically asymptomatic with episodes, did not tolerate Toprol XL dose increase due to fatigue/weekness, currently prescribed 25mg  daily. Judson Roch accepted appointment on 07/12/19 at 09:30 at [place] office (time okay per A. Lynnell Jude, NP). Sarah aware of office location, she is approved to attend appointment with pt. Judson Roch will make pt's daughters aware of appointment. No further questions at this time. Direct number given if she needs to reschedule.  LMOVM (DPR) for Shirlean Mylar advising appointment has been scheduled for tomorrow, provided explanation of visit purposed. [place] phone number for any questions/concerns.

## 2019-07-12 ENCOUNTER — Other Ambulatory Visit: Payer: Self-pay

## 2019-07-12 ENCOUNTER — Ambulatory Visit (INDEPENDENT_AMBULATORY_CARE_PROVIDER_SITE_OTHER): Payer: Medicare Other | Admitting: *Deleted

## 2019-07-12 DIAGNOSIS — I639 Cerebral infarction, unspecified: Secondary | ICD-10-CM

## 2019-07-12 LAB — CUP PACEART INCLINIC DEVICE CHECK
Date Time Interrogation Session: 20200922093225
Implantable Pulse Generator Implant Date: 20191104

## 2019-07-12 NOTE — Progress Notes (Signed)
See PaceArt report.  Rebecca Marshall, NP 07/12/2019 9:32 AM

## 2019-07-18 DIAGNOSIS — E1122 Type 2 diabetes mellitus with diabetic chronic kidney disease: Secondary | ICD-10-CM | POA: Diagnosis not present

## 2019-07-18 DIAGNOSIS — G3184 Mild cognitive impairment, so stated: Secondary | ICD-10-CM | POA: Diagnosis not present

## 2019-07-18 DIAGNOSIS — N183 Chronic kidney disease, stage 3 (moderate): Secondary | ICD-10-CM | POA: Diagnosis not present

## 2019-07-18 DIAGNOSIS — R600 Localized edema: Secondary | ICD-10-CM | POA: Diagnosis not present

## 2019-07-19 ENCOUNTER — Ambulatory Visit (INDEPENDENT_AMBULATORY_CARE_PROVIDER_SITE_OTHER): Payer: Medicare Other | Admitting: *Deleted

## 2019-07-19 DIAGNOSIS — I639 Cerebral infarction, unspecified: Secondary | ICD-10-CM

## 2019-07-20 LAB — CUP PACEART REMOTE DEVICE CHECK
Date Time Interrogation Session: 20200929214334
Implantable Pulse Generator Implant Date: 20191104

## 2019-07-26 ENCOUNTER — Other Ambulatory Visit: Payer: Self-pay

## 2019-07-29 DIAGNOSIS — E1165 Type 2 diabetes mellitus with hyperglycemia: Secondary | ICD-10-CM | POA: Diagnosis not present

## 2019-07-29 DIAGNOSIS — E785 Hyperlipidemia, unspecified: Secondary | ICD-10-CM | POA: Diagnosis not present

## 2019-07-29 DIAGNOSIS — E1122 Type 2 diabetes mellitus with diabetic chronic kidney disease: Secondary | ICD-10-CM | POA: Diagnosis not present

## 2019-07-29 NOTE — Progress Notes (Signed)
Carelink Summary Report / Loop Recorder 

## 2019-08-01 DIAGNOSIS — E1122 Type 2 diabetes mellitus with diabetic chronic kidney disease: Secondary | ICD-10-CM | POA: Diagnosis not present

## 2019-08-01 DIAGNOSIS — R600 Localized edema: Secondary | ICD-10-CM | POA: Diagnosis not present

## 2019-08-01 DIAGNOSIS — G3184 Mild cognitive impairment, so stated: Secondary | ICD-10-CM | POA: Diagnosis not present

## 2019-08-01 DIAGNOSIS — Z8673 Personal history of transient ischemic attack (TIA), and cerebral infarction without residual deficits: Secondary | ICD-10-CM | POA: Diagnosis not present

## 2019-08-09 DIAGNOSIS — N1831 Chronic kidney disease, stage 3a: Secondary | ICD-10-CM | POA: Diagnosis not present

## 2019-08-09 DIAGNOSIS — E1122 Type 2 diabetes mellitus with diabetic chronic kidney disease: Secondary | ICD-10-CM | POA: Diagnosis not present

## 2019-08-09 DIAGNOSIS — I129 Hypertensive chronic kidney disease with stage 1 through stage 4 chronic kidney disease, or unspecified chronic kidney disease: Secondary | ICD-10-CM | POA: Diagnosis not present

## 2019-08-09 DIAGNOSIS — E1165 Type 2 diabetes mellitus with hyperglycemia: Secondary | ICD-10-CM | POA: Diagnosis not present

## 2019-08-21 LAB — CUP PACEART REMOTE DEVICE CHECK
Date Time Interrogation Session: 20201101185021
Implantable Pulse Generator Implant Date: 20191104

## 2019-08-22 ENCOUNTER — Ambulatory Visit (INDEPENDENT_AMBULATORY_CARE_PROVIDER_SITE_OTHER): Payer: Medicare Other | Admitting: *Deleted

## 2019-08-22 DIAGNOSIS — I63511 Cerebral infarction due to unspecified occlusion or stenosis of right middle cerebral artery: Secondary | ICD-10-CM | POA: Diagnosis not present

## 2019-09-03 DIAGNOSIS — R410 Disorientation, unspecified: Secondary | ICD-10-CM | POA: Diagnosis not present

## 2019-09-14 NOTE — Progress Notes (Signed)
Carelink Summary Report / Loop Recorder 

## 2019-09-19 DIAGNOSIS — Z8673 Personal history of transient ischemic attack (TIA), and cerebral infarction without residual deficits: Secondary | ICD-10-CM | POA: Diagnosis not present

## 2019-09-19 DIAGNOSIS — E1122 Type 2 diabetes mellitus with diabetic chronic kidney disease: Secondary | ICD-10-CM | POA: Diagnosis not present

## 2019-09-19 DIAGNOSIS — R600 Localized edema: Secondary | ICD-10-CM | POA: Diagnosis not present

## 2019-09-19 DIAGNOSIS — G3184 Mild cognitive impairment, so stated: Secondary | ICD-10-CM | POA: Diagnosis not present

## 2019-09-23 ENCOUNTER — Ambulatory Visit (INDEPENDENT_AMBULATORY_CARE_PROVIDER_SITE_OTHER): Payer: Medicare Other | Admitting: *Deleted

## 2019-09-23 DIAGNOSIS — I63511 Cerebral infarction due to unspecified occlusion or stenosis of right middle cerebral artery: Secondary | ICD-10-CM

## 2019-09-24 LAB — CUP PACEART REMOTE DEVICE CHECK
Date Time Interrogation Session: 20201204164745
Implantable Pulse Generator Implant Date: 20191104

## 2019-10-11 DIAGNOSIS — E1122 Type 2 diabetes mellitus with diabetic chronic kidney disease: Secondary | ICD-10-CM | POA: Diagnosis not present

## 2019-10-11 DIAGNOSIS — I129 Hypertensive chronic kidney disease with stage 1 through stage 4 chronic kidney disease, or unspecified chronic kidney disease: Secondary | ICD-10-CM | POA: Diagnosis not present

## 2019-10-11 DIAGNOSIS — E7849 Other hyperlipidemia: Secondary | ICD-10-CM | POA: Diagnosis not present

## 2019-10-11 DIAGNOSIS — N189 Chronic kidney disease, unspecified: Secondary | ICD-10-CM | POA: Diagnosis not present

## 2019-10-26 ENCOUNTER — Ambulatory Visit (INDEPENDENT_AMBULATORY_CARE_PROVIDER_SITE_OTHER): Payer: Medicare Other | Admitting: *Deleted

## 2019-10-26 DIAGNOSIS — I63511 Cerebral infarction due to unspecified occlusion or stenosis of right middle cerebral artery: Secondary | ICD-10-CM | POA: Diagnosis not present

## 2019-10-27 LAB — CUP PACEART REMOTE DEVICE CHECK
Date Time Interrogation Session: 20210106164616
Implantable Pulse Generator Implant Date: 20191104

## 2019-11-10 ENCOUNTER — Other Ambulatory Visit: Payer: Self-pay | Admitting: Internal Medicine

## 2019-11-10 NOTE — Telephone Encounter (Signed)
This is a Rebecca Hardy pt.  °

## 2019-11-11 DIAGNOSIS — E7849 Other hyperlipidemia: Secondary | ICD-10-CM | POA: Diagnosis not present

## 2019-11-11 DIAGNOSIS — E1122 Type 2 diabetes mellitus with diabetic chronic kidney disease: Secondary | ICD-10-CM | POA: Diagnosis not present

## 2019-11-11 DIAGNOSIS — I129 Hypertensive chronic kidney disease with stage 1 through stage 4 chronic kidney disease, or unspecified chronic kidney disease: Secondary | ICD-10-CM | POA: Diagnosis not present

## 2019-11-11 DIAGNOSIS — N189 Chronic kidney disease, unspecified: Secondary | ICD-10-CM | POA: Diagnosis not present

## 2019-11-28 ENCOUNTER — Ambulatory Visit (INDEPENDENT_AMBULATORY_CARE_PROVIDER_SITE_OTHER): Payer: Medicare Other | Admitting: *Deleted

## 2019-11-28 DIAGNOSIS — I63511 Cerebral infarction due to unspecified occlusion or stenosis of right middle cerebral artery: Secondary | ICD-10-CM | POA: Diagnosis not present

## 2019-11-28 LAB — CUP PACEART REMOTE DEVICE CHECK
Date Time Interrogation Session: 20210207234458
Implantable Pulse Generator Implant Date: 20191104

## 2019-11-29 NOTE — Progress Notes (Signed)
ILR Remote 

## 2019-12-29 ENCOUNTER — Ambulatory Visit (INDEPENDENT_AMBULATORY_CARE_PROVIDER_SITE_OTHER): Payer: Medicare Other | Admitting: *Deleted

## 2019-12-29 DIAGNOSIS — I63511 Cerebral infarction due to unspecified occlusion or stenosis of right middle cerebral artery: Secondary | ICD-10-CM | POA: Diagnosis not present

## 2019-12-29 LAB — CUP PACEART REMOTE DEVICE CHECK
Date Time Interrogation Session: 20210311001635
Implantable Pulse Generator Implant Date: 20191104

## 2019-12-30 NOTE — Progress Notes (Signed)
ILR Remote 

## 2020-01-30 ENCOUNTER — Ambulatory Visit (INDEPENDENT_AMBULATORY_CARE_PROVIDER_SITE_OTHER): Payer: Medicare Other | Admitting: *Deleted

## 2020-01-30 DIAGNOSIS — I63511 Cerebral infarction due to unspecified occlusion or stenosis of right middle cerebral artery: Secondary | ICD-10-CM | POA: Diagnosis not present

## 2020-01-30 LAB — CUP PACEART REMOTE DEVICE CHECK
Date Time Interrogation Session: 20210411032936
Implantable Pulse Generator Implant Date: 20191104

## 2020-01-31 NOTE — Progress Notes (Signed)
ILR Remote 

## 2020-03-01 LAB — CUP PACEART REMOTE DEVICE CHECK
Date Time Interrogation Session: 20210512032252
Implantable Pulse Generator Implant Date: 20191104

## 2020-03-05 ENCOUNTER — Ambulatory Visit (INDEPENDENT_AMBULATORY_CARE_PROVIDER_SITE_OTHER): Payer: Medicare Other | Admitting: *Deleted

## 2020-03-05 DIAGNOSIS — I63511 Cerebral infarction due to unspecified occlusion or stenosis of right middle cerebral artery: Secondary | ICD-10-CM | POA: Diagnosis not present

## 2020-03-06 NOTE — Progress Notes (Signed)
Carelink Summary Report / Loop Recorder 

## 2020-04-09 ENCOUNTER — Ambulatory Visit (INDEPENDENT_AMBULATORY_CARE_PROVIDER_SITE_OTHER): Payer: Medicare Other | Admitting: *Deleted

## 2020-04-09 DIAGNOSIS — I63511 Cerebral infarction due to unspecified occlusion or stenosis of right middle cerebral artery: Secondary | ICD-10-CM

## 2020-04-09 LAB — CUP PACEART REMOTE DEVICE CHECK
Date Time Interrogation Session: 20210620235909
Implantable Pulse Generator Implant Date: 20191104

## 2020-04-10 NOTE — Progress Notes (Signed)
Carelink Summary Report / Loop Recorder 

## 2020-05-14 ENCOUNTER — Ambulatory Visit (INDEPENDENT_AMBULATORY_CARE_PROVIDER_SITE_OTHER): Payer: Medicare Other | Admitting: *Deleted

## 2020-05-14 DIAGNOSIS — I63511 Cerebral infarction due to unspecified occlusion or stenosis of right middle cerebral artery: Secondary | ICD-10-CM | POA: Diagnosis not present

## 2020-05-15 LAB — CUP PACEART REMOTE DEVICE CHECK
Date Time Interrogation Session: 20210725232723
Implantable Pulse Generator Implant Date: 20191104

## 2020-05-17 NOTE — Progress Notes (Signed)
Carelink Summary Report / Loop Recorder 

## 2020-08-08 DIAGNOSIS — R279 Unspecified lack of coordination: Secondary | ICD-10-CM | POA: Diagnosis not present

## 2020-08-08 DIAGNOSIS — M6281 Muscle weakness (generalized): Secondary | ICD-10-CM | POA: Diagnosis not present

## 2020-10-10 DIAGNOSIS — Z20828 Contact with and (suspected) exposure to other viral communicable diseases: Secondary | ICD-10-CM | POA: Diagnosis not present

## 2020-10-10 DIAGNOSIS — U071 COVID-19: Secondary | ICD-10-CM | POA: Diagnosis not present

## 2020-10-24 DIAGNOSIS — R278 Other lack of coordination: Secondary | ICD-10-CM | POA: Diagnosis not present

## 2020-10-24 DIAGNOSIS — N189 Chronic kidney disease, unspecified: Secondary | ICD-10-CM | POA: Diagnosis not present

## 2020-10-24 DIAGNOSIS — G459 Transient cerebral ischemic attack, unspecified: Secondary | ICD-10-CM | POA: Diagnosis not present

## 2020-10-24 DIAGNOSIS — M6281 Muscle weakness (generalized): Secondary | ICD-10-CM | POA: Diagnosis not present

## 2020-10-24 DIAGNOSIS — R262 Difficulty in walking, not elsewhere classified: Secondary | ICD-10-CM | POA: Diagnosis not present

## 2020-10-24 DIAGNOSIS — E1142 Type 2 diabetes mellitus with diabetic polyneuropathy: Secondary | ICD-10-CM | POA: Diagnosis not present

## 2020-10-26 DIAGNOSIS — E1142 Type 2 diabetes mellitus with diabetic polyneuropathy: Secondary | ICD-10-CM | POA: Diagnosis not present

## 2020-10-26 DIAGNOSIS — R262 Difficulty in walking, not elsewhere classified: Secondary | ICD-10-CM | POA: Diagnosis not present

## 2020-10-26 DIAGNOSIS — N189 Chronic kidney disease, unspecified: Secondary | ICD-10-CM | POA: Diagnosis not present

## 2020-10-26 DIAGNOSIS — G459 Transient cerebral ischemic attack, unspecified: Secondary | ICD-10-CM | POA: Diagnosis not present

## 2020-10-26 DIAGNOSIS — M6281 Muscle weakness (generalized): Secondary | ICD-10-CM | POA: Diagnosis not present

## 2020-10-26 DIAGNOSIS — R278 Other lack of coordination: Secondary | ICD-10-CM | POA: Diagnosis not present

## 2020-10-29 DIAGNOSIS — M6281 Muscle weakness (generalized): Secondary | ICD-10-CM | POA: Diagnosis not present

## 2020-10-29 DIAGNOSIS — R262 Difficulty in walking, not elsewhere classified: Secondary | ICD-10-CM | POA: Diagnosis not present

## 2020-10-29 DIAGNOSIS — G459 Transient cerebral ischemic attack, unspecified: Secondary | ICD-10-CM | POA: Diagnosis not present

## 2020-10-29 DIAGNOSIS — N189 Chronic kidney disease, unspecified: Secondary | ICD-10-CM | POA: Diagnosis not present

## 2020-10-29 DIAGNOSIS — E1142 Type 2 diabetes mellitus with diabetic polyneuropathy: Secondary | ICD-10-CM | POA: Diagnosis not present

## 2020-10-29 DIAGNOSIS — R278 Other lack of coordination: Secondary | ICD-10-CM | POA: Diagnosis not present

## 2020-10-31 DIAGNOSIS — N189 Chronic kidney disease, unspecified: Secondary | ICD-10-CM | POA: Diagnosis not present

## 2020-10-31 DIAGNOSIS — M6281 Muscle weakness (generalized): Secondary | ICD-10-CM | POA: Diagnosis not present

## 2020-10-31 DIAGNOSIS — G459 Transient cerebral ischemic attack, unspecified: Secondary | ICD-10-CM | POA: Diagnosis not present

## 2020-10-31 DIAGNOSIS — R262 Difficulty in walking, not elsewhere classified: Secondary | ICD-10-CM | POA: Diagnosis not present

## 2020-10-31 DIAGNOSIS — E1142 Type 2 diabetes mellitus with diabetic polyneuropathy: Secondary | ICD-10-CM | POA: Diagnosis not present

## 2020-10-31 DIAGNOSIS — R278 Other lack of coordination: Secondary | ICD-10-CM | POA: Diagnosis not present

## 2020-11-01 DIAGNOSIS — G459 Transient cerebral ischemic attack, unspecified: Secondary | ICD-10-CM | POA: Diagnosis not present

## 2020-11-01 DIAGNOSIS — E1142 Type 2 diabetes mellitus with diabetic polyneuropathy: Secondary | ICD-10-CM | POA: Diagnosis not present

## 2020-11-01 DIAGNOSIS — N189 Chronic kidney disease, unspecified: Secondary | ICD-10-CM | POA: Diagnosis not present

## 2020-11-02 DIAGNOSIS — R410 Disorientation, unspecified: Secondary | ICD-10-CM | POA: Diagnosis not present

## 2020-11-03 DIAGNOSIS — R262 Difficulty in walking, not elsewhere classified: Secondary | ICD-10-CM | POA: Diagnosis not present

## 2020-11-03 DIAGNOSIS — M6281 Muscle weakness (generalized): Secondary | ICD-10-CM | POA: Diagnosis not present

## 2020-11-03 DIAGNOSIS — G459 Transient cerebral ischemic attack, unspecified: Secondary | ICD-10-CM | POA: Diagnosis not present

## 2020-11-03 DIAGNOSIS — E1142 Type 2 diabetes mellitus with diabetic polyneuropathy: Secondary | ICD-10-CM | POA: Diagnosis not present

## 2020-11-03 DIAGNOSIS — R278 Other lack of coordination: Secondary | ICD-10-CM | POA: Diagnosis not present

## 2020-11-03 DIAGNOSIS — N189 Chronic kidney disease, unspecified: Secondary | ICD-10-CM | POA: Diagnosis not present

## 2020-11-04 DIAGNOSIS — F329 Major depressive disorder, single episode, unspecified: Secondary | ICD-10-CM | POA: Diagnosis not present

## 2020-11-04 DIAGNOSIS — F0390 Unspecified dementia without behavioral disturbance: Secondary | ICD-10-CM | POA: Diagnosis not present

## 2020-11-04 DIAGNOSIS — E119 Type 2 diabetes mellitus without complications: Secondary | ICD-10-CM | POA: Diagnosis not present

## 2020-11-04 DIAGNOSIS — D518 Other vitamin B12 deficiency anemias: Secondary | ICD-10-CM | POA: Diagnosis not present

## 2020-11-04 DIAGNOSIS — E559 Vitamin D deficiency, unspecified: Secondary | ICD-10-CM | POA: Diagnosis not present

## 2020-11-04 DIAGNOSIS — N189 Chronic kidney disease, unspecified: Secondary | ICD-10-CM | POA: Diagnosis not present

## 2020-11-04 DIAGNOSIS — I10 Essential (primary) hypertension: Secondary | ICD-10-CM | POA: Diagnosis not present

## 2020-11-04 DIAGNOSIS — E038 Other specified hypothyroidism: Secondary | ICD-10-CM | POA: Diagnosis not present

## 2020-11-06 DIAGNOSIS — N189 Chronic kidney disease, unspecified: Secondary | ICD-10-CM | POA: Diagnosis not present

## 2020-11-06 DIAGNOSIS — L309 Dermatitis, unspecified: Secondary | ICD-10-CM | POA: Diagnosis not present

## 2020-11-06 DIAGNOSIS — I10 Essential (primary) hypertension: Secondary | ICD-10-CM | POA: Diagnosis not present

## 2020-11-06 DIAGNOSIS — F0390 Unspecified dementia without behavioral disturbance: Secondary | ICD-10-CM | POA: Diagnosis not present

## 2020-11-07 DIAGNOSIS — G459 Transient cerebral ischemic attack, unspecified: Secondary | ICD-10-CM | POA: Diagnosis not present

## 2020-11-07 DIAGNOSIS — R262 Difficulty in walking, not elsewhere classified: Secondary | ICD-10-CM | POA: Diagnosis not present

## 2020-11-07 DIAGNOSIS — R278 Other lack of coordination: Secondary | ICD-10-CM | POA: Diagnosis not present

## 2020-11-07 DIAGNOSIS — E1142 Type 2 diabetes mellitus with diabetic polyneuropathy: Secondary | ICD-10-CM | POA: Diagnosis not present

## 2020-11-07 DIAGNOSIS — M6281 Muscle weakness (generalized): Secondary | ICD-10-CM | POA: Diagnosis not present

## 2020-11-07 DIAGNOSIS — N189 Chronic kidney disease, unspecified: Secondary | ICD-10-CM | POA: Diagnosis not present

## 2020-11-09 DIAGNOSIS — R278 Other lack of coordination: Secondary | ICD-10-CM | POA: Diagnosis not present

## 2020-11-09 DIAGNOSIS — G459 Transient cerebral ischemic attack, unspecified: Secondary | ICD-10-CM | POA: Diagnosis not present

## 2020-11-09 DIAGNOSIS — N189 Chronic kidney disease, unspecified: Secondary | ICD-10-CM | POA: Diagnosis not present

## 2020-11-09 DIAGNOSIS — R262 Difficulty in walking, not elsewhere classified: Secondary | ICD-10-CM | POA: Diagnosis not present

## 2020-11-09 DIAGNOSIS — M6281 Muscle weakness (generalized): Secondary | ICD-10-CM | POA: Diagnosis not present

## 2020-11-09 DIAGNOSIS — E1142 Type 2 diabetes mellitus with diabetic polyneuropathy: Secondary | ICD-10-CM | POA: Diagnosis not present

## 2020-11-12 DIAGNOSIS — N189 Chronic kidney disease, unspecified: Secondary | ICD-10-CM | POA: Diagnosis not present

## 2020-11-12 DIAGNOSIS — E1142 Type 2 diabetes mellitus with diabetic polyneuropathy: Secondary | ICD-10-CM | POA: Diagnosis not present

## 2020-11-12 DIAGNOSIS — M6281 Muscle weakness (generalized): Secondary | ICD-10-CM | POA: Diagnosis not present

## 2020-11-12 DIAGNOSIS — R278 Other lack of coordination: Secondary | ICD-10-CM | POA: Diagnosis not present

## 2020-11-12 DIAGNOSIS — R262 Difficulty in walking, not elsewhere classified: Secondary | ICD-10-CM | POA: Diagnosis not present

## 2020-11-12 DIAGNOSIS — G459 Transient cerebral ischemic attack, unspecified: Secondary | ICD-10-CM | POA: Diagnosis not present

## 2020-11-14 DIAGNOSIS — R278 Other lack of coordination: Secondary | ICD-10-CM | POA: Diagnosis not present

## 2020-11-14 DIAGNOSIS — G459 Transient cerebral ischemic attack, unspecified: Secondary | ICD-10-CM | POA: Diagnosis not present

## 2020-11-14 DIAGNOSIS — R262 Difficulty in walking, not elsewhere classified: Secondary | ICD-10-CM | POA: Diagnosis not present

## 2020-11-14 DIAGNOSIS — M6281 Muscle weakness (generalized): Secondary | ICD-10-CM | POA: Diagnosis not present

## 2020-11-14 DIAGNOSIS — E1142 Type 2 diabetes mellitus with diabetic polyneuropathy: Secondary | ICD-10-CM | POA: Diagnosis not present

## 2020-11-14 DIAGNOSIS — N189 Chronic kidney disease, unspecified: Secondary | ICD-10-CM | POA: Diagnosis not present

## 2020-11-16 DIAGNOSIS — R262 Difficulty in walking, not elsewhere classified: Secondary | ICD-10-CM | POA: Diagnosis not present

## 2020-11-16 DIAGNOSIS — R278 Other lack of coordination: Secondary | ICD-10-CM | POA: Diagnosis not present

## 2020-11-16 DIAGNOSIS — M6281 Muscle weakness (generalized): Secondary | ICD-10-CM | POA: Diagnosis not present

## 2020-11-16 DIAGNOSIS — N189 Chronic kidney disease, unspecified: Secondary | ICD-10-CM | POA: Diagnosis not present

## 2020-11-16 DIAGNOSIS — G459 Transient cerebral ischemic attack, unspecified: Secondary | ICD-10-CM | POA: Diagnosis not present

## 2020-11-16 DIAGNOSIS — E1142 Type 2 diabetes mellitus with diabetic polyneuropathy: Secondary | ICD-10-CM | POA: Diagnosis not present

## 2020-11-19 DIAGNOSIS — G459 Transient cerebral ischemic attack, unspecified: Secondary | ICD-10-CM | POA: Diagnosis not present

## 2020-11-19 DIAGNOSIS — M6281 Muscle weakness (generalized): Secondary | ICD-10-CM | POA: Diagnosis not present

## 2020-11-19 DIAGNOSIS — E1142 Type 2 diabetes mellitus with diabetic polyneuropathy: Secondary | ICD-10-CM | POA: Diagnosis not present

## 2020-11-19 DIAGNOSIS — N189 Chronic kidney disease, unspecified: Secondary | ICD-10-CM | POA: Diagnosis not present

## 2020-11-20 ENCOUNTER — Other Ambulatory Visit: Payer: Self-pay

## 2020-11-20 ENCOUNTER — Encounter (HOSPITAL_COMMUNITY): Payer: Self-pay

## 2020-11-20 ENCOUNTER — Inpatient Hospital Stay (HOSPITAL_COMMUNITY)
Admission: EM | Admit: 2020-11-20 | Discharge: 2020-11-22 | DRG: 682 | Disposition: A | Discharge status: Skilled Nursing Facility | Payer: Medicare Other | Source: Skilled Nursing Facility | Attending: Internal Medicine | Admitting: Internal Medicine

## 2020-11-20 DIAGNOSIS — I10 Essential (primary) hypertension: Secondary | ICD-10-CM

## 2020-11-20 DIAGNOSIS — Z79899 Other long term (current) drug therapy: Secondary | ICD-10-CM

## 2020-11-20 DIAGNOSIS — I959 Hypotension, unspecified: Secondary | ICD-10-CM | POA: Diagnosis not present

## 2020-11-20 DIAGNOSIS — F419 Anxiety disorder, unspecified: Secondary | ICD-10-CM | POA: Diagnosis present

## 2020-11-20 DIAGNOSIS — I129 Hypertensive chronic kidney disease with stage 1 through stage 4 chronic kidney disease, or unspecified chronic kidney disease: Secondary | ICD-10-CM | POA: Diagnosis present

## 2020-11-20 DIAGNOSIS — E871 Hypo-osmolality and hyponatremia: Secondary | ICD-10-CM | POA: Diagnosis present

## 2020-11-20 DIAGNOSIS — E11649 Type 2 diabetes mellitus with hypoglycemia without coma: Secondary | ICD-10-CM | POA: Diagnosis present

## 2020-11-20 DIAGNOSIS — E7800 Pure hypercholesterolemia, unspecified: Secondary | ICD-10-CM | POA: Diagnosis present

## 2020-11-20 DIAGNOSIS — Z880 Allergy status to penicillin: Secondary | ICD-10-CM

## 2020-11-20 DIAGNOSIS — N179 Acute kidney failure, unspecified: Secondary | ICD-10-CM | POA: Diagnosis not present

## 2020-11-20 DIAGNOSIS — R404 Transient alteration of awareness: Secondary | ICD-10-CM | POA: Diagnosis not present

## 2020-11-20 DIAGNOSIS — I69354 Hemiplegia and hemiparesis following cerebral infarction affecting left non-dominant side: Secondary | ICD-10-CM

## 2020-11-20 DIAGNOSIS — N1832 Chronic kidney disease, stage 3b: Secondary | ICD-10-CM | POA: Diagnosis present

## 2020-11-20 DIAGNOSIS — Z8249 Family history of ischemic heart disease and other diseases of the circulatory system: Secondary | ICD-10-CM

## 2020-11-20 DIAGNOSIS — Z7982 Long term (current) use of aspirin: Secondary | ICD-10-CM

## 2020-11-20 DIAGNOSIS — E1122 Type 2 diabetes mellitus with diabetic chronic kidney disease: Secondary | ICD-10-CM | POA: Diagnosis present

## 2020-11-20 DIAGNOSIS — F32A Depression, unspecified: Secondary | ICD-10-CM | POA: Diagnosis present

## 2020-11-20 DIAGNOSIS — D696 Thrombocytopenia, unspecified: Secondary | ICD-10-CM | POA: Diagnosis present

## 2020-11-20 DIAGNOSIS — I4891 Unspecified atrial fibrillation: Secondary | ICD-10-CM | POA: Diagnosis not present

## 2020-11-20 DIAGNOSIS — Z794 Long term (current) use of insulin: Secondary | ICD-10-CM

## 2020-11-20 DIAGNOSIS — U071 COVID-19: Secondary | ICD-10-CM | POA: Diagnosis present

## 2020-11-20 DIAGNOSIS — E785 Hyperlipidemia, unspecified: Secondary | ICD-10-CM | POA: Diagnosis present

## 2020-11-20 DIAGNOSIS — E860 Dehydration: Secondary | ICD-10-CM | POA: Diagnosis present

## 2020-11-20 DIAGNOSIS — R0902 Hypoxemia: Secondary | ICD-10-CM | POA: Diagnosis not present

## 2020-11-20 DIAGNOSIS — K219 Gastro-esophageal reflux disease without esophagitis: Secondary | ICD-10-CM | POA: Diagnosis present

## 2020-11-20 DIAGNOSIS — Z885 Allergy status to narcotic agent status: Secondary | ICD-10-CM

## 2020-11-20 DIAGNOSIS — R000 Tachycardia, unspecified: Secondary | ICD-10-CM | POA: Diagnosis not present

## 2020-11-20 DIAGNOSIS — Z86000 Personal history of in-situ neoplasm of breast: Secondary | ICD-10-CM

## 2020-11-20 DIAGNOSIS — Z8673 Personal history of transient ischemic attack (TIA), and cerebral infarction without residual deficits: Secondary | ICD-10-CM

## 2020-11-20 DIAGNOSIS — D649 Anemia, unspecified: Secondary | ICD-10-CM | POA: Diagnosis present

## 2020-11-20 DIAGNOSIS — E1165 Type 2 diabetes mellitus with hyperglycemia: Secondary | ICD-10-CM | POA: Diagnosis not present

## 2020-11-20 LAB — URINALYSIS, ROUTINE W REFLEX MICROSCOPIC
Bilirubin Urine: NEGATIVE
Glucose, UA: 50 mg/dL — AB
Hgb urine dipstick: NEGATIVE
Ketones, ur: NEGATIVE mg/dL
Nitrite: NEGATIVE
Protein, ur: NEGATIVE mg/dL
Specific Gravity, Urine: 1.009 (ref 1.005–1.030)
pH: 5 (ref 5.0–8.0)

## 2020-11-20 LAB — CBC WITH DIFFERENTIAL/PLATELET
Abs Immature Granulocytes: 0.03 10*3/uL (ref 0.00–0.07)
Basophils Absolute: 0 10*3/uL (ref 0.0–0.1)
Basophils Relative: 0 %
Eosinophils Absolute: 0.1 10*3/uL (ref 0.0–0.5)
Eosinophils Relative: 2 %
HCT: 36.4 % (ref 36.0–46.0)
Hemoglobin: 11.1 g/dL — ABNORMAL LOW (ref 12.0–15.0)
Immature Granulocytes: 0 %
Lymphocytes Relative: 23 %
Lymphs Abs: 1.9 10*3/uL (ref 0.7–4.0)
MCH: 28.8 pg (ref 26.0–34.0)
MCHC: 30.5 g/dL (ref 30.0–36.0)
MCV: 94.5 fL (ref 80.0–100.0)
Monocytes Absolute: 0.8 10*3/uL (ref 0.1–1.0)
Monocytes Relative: 10 %
Neutro Abs: 5.4 10*3/uL (ref 1.7–7.7)
Neutrophils Relative %: 65 %
Platelets: 99 10*3/uL — ABNORMAL LOW (ref 150–400)
RBC: 3.85 MIL/uL — ABNORMAL LOW (ref 3.87–5.11)
RDW: 12.8 % (ref 11.5–15.5)
WBC: 8.3 10*3/uL (ref 4.0–10.5)
nRBC: 0 % (ref 0.0–0.2)

## 2020-11-20 LAB — COMPREHENSIVE METABOLIC PANEL
ALT: 20 U/L (ref 0–44)
AST: 29 U/L (ref 15–41)
Albumin: 3.4 g/dL — ABNORMAL LOW (ref 3.5–5.0)
Alkaline Phosphatase: 56 U/L (ref 38–126)
Anion gap: 8 (ref 5–15)
BUN: 66 mg/dL — ABNORMAL HIGH (ref 8–23)
CO2: 18 mmol/L — ABNORMAL LOW (ref 22–32)
Calcium: 8.4 mg/dL — ABNORMAL LOW (ref 8.9–10.3)
Chloride: 104 mmol/L (ref 98–111)
Creatinine, Ser: 3.05 mg/dL — ABNORMAL HIGH (ref 0.44–1.00)
GFR, Estimated: 14 mL/min — ABNORMAL LOW (ref >60)
Glucose, Bld: 212 mg/dL — ABNORMAL HIGH (ref 70–99)
Potassium: 4.4 mmol/L (ref 3.5–5.1)
Sodium: 130 mmol/L — ABNORMAL LOW (ref 135–145)
Total Bilirubin: 0.7 mg/dL (ref 0.3–1.2)
Total Protein: 6.1 g/dL — ABNORMAL LOW (ref 6.5–8.1)

## 2020-11-20 LAB — HEMOGLOBIN A1C
Hgb A1c MFr Bld: 7.9 % — ABNORMAL HIGH (ref 4.8–5.6)
Mean Plasma Glucose: 180.03 mg/dL

## 2020-11-20 LAB — RETICULOCYTES
Immature Retic Fract: 6.6 % (ref 2.3–15.9)
RBC.: 3.71 MIL/uL — ABNORMAL LOW (ref 3.87–5.11)
Retic Count, Absolute: 20 10*3/uL (ref 19.0–186.0)
Retic Ct Pct: 0.5 % (ref 0.4–3.1)

## 2020-11-20 LAB — IRON AND TIBC
Iron: 74 ug/dL (ref 28–170)
Saturation Ratios: 37 % — ABNORMAL HIGH (ref 10.4–31.8)
TIBC: 200 ug/dL — ABNORMAL LOW (ref 250–450)
UIBC: 126 ug/dL

## 2020-11-20 LAB — CBG MONITORING, ED: Glucose-Capillary: 146 mg/dL — ABNORMAL HIGH (ref 70–99)

## 2020-11-20 LAB — LACTIC ACID, PLASMA: Lactic Acid, Venous: 1.4 mmol/L (ref 0.5–1.9)

## 2020-11-20 LAB — FERRITIN: Ferritin: 212 ng/mL (ref 11–307)

## 2020-11-20 LAB — FOLATE: Folate: 17.9 ng/mL (ref >5.9)

## 2020-11-20 LAB — TSH: TSH: 0.835 u[IU]/mL (ref 0.350–4.500)

## 2020-11-20 LAB — GLUCOSE, CAPILLARY: Glucose-Capillary: 150 mg/dL — ABNORMAL HIGH (ref 70–99)

## 2020-11-20 LAB — VITAMIN B12: Vitamin B-12: 145 pg/mL — ABNORMAL LOW (ref 180–914)

## 2020-11-20 LAB — SARS CORONAVIRUS 2 BY RT PCR (HOSPITAL ORDER, PERFORMED IN ~~LOC~~ HOSPITAL LAB): SARS Coronavirus 2: POSITIVE — AB

## 2020-11-20 MED ORDER — SODIUM CHLORIDE 0.9 % IV SOLN: 100.0000 mg | Freq: Every day | INTRAVENOUS | Status: DC

## 2020-11-20 MED ORDER — ROSUVASTATIN CALCIUM 20 MG PO TABS
20.0000 mg | ORAL_TABLET | Freq: Every day | ORAL | Status: DC
  Administered 2020-11-20 – 2020-11-21 (×2): 20 mg via ORAL
  Filled 2020-11-20 (×2): qty 1

## 2020-11-20 MED ORDER — LACTATED RINGERS IV BOLUS
1000.0000 mL | Freq: Once | INTRAVENOUS | Status: AC
  Administered 2020-11-20: 1000 mL via INTRAVENOUS

## 2020-11-20 MED ORDER — INSULIN ASPART 100 UNIT/ML ~~LOC~~ SOLN: 0.0000 [IU] | Freq: Three times a day (TID) | SUBCUTANEOUS | Status: DC

## 2020-11-20 MED ORDER — LORAZEPAM 0.5 MG PO TABS
0.5000 mg | ORAL_TABLET | Freq: Every day | ORAL | Status: DC | PRN
  Administered 2020-11-20 – 2020-11-22 (×2): 0.5 mg via ORAL
  Filled 2020-11-20 (×2): qty 1

## 2020-11-20 MED ORDER — ACETAMINOPHEN 325 MG PO TABS: 650.0000 mg | ORAL_TABLET | Freq: Four times a day (QID) | ORAL | Status: DC | PRN

## 2020-11-20 MED ORDER — ONDANSETRON HCL 4 MG/2ML IJ SOLN
4.0000 mg | Freq: Four times a day (QID) | INTRAMUSCULAR | Status: DC | PRN
  Filled 2020-11-20: qty 2

## 2020-11-20 MED ORDER — SERTRALINE HCL 50 MG PO TABS
25.0000 mg | ORAL_TABLET | Freq: Every day | ORAL | Status: DC
  Administered 2020-11-20 – 2020-11-21 (×2): 25 mg via ORAL
  Filled 2020-11-20 (×3): qty 1

## 2020-11-20 MED ORDER — ACETAMINOPHEN 650 MG RE SUPP: 650.0000 mg | Freq: Four times a day (QID) | RECTAL | Status: DC | PRN

## 2020-11-20 MED ORDER — SODIUM CHLORIDE 0.9 % IV SOLN
100.0000 mg | INTRAVENOUS | Status: AC
  Administered 2020-11-20 (×2): 100 mg via INTRAVENOUS
  Filled 2020-11-20: qty 20

## 2020-11-20 MED ORDER — SODIUM CHLORIDE 0.9 % IV SOLN: INTRAVENOUS | Status: DC

## 2020-11-20 MED ORDER — SODIUM CHLORIDE 0.9 % IV SOLN
100.0000 mg | Freq: Every day | INTRAVENOUS | Status: AC
  Administered 2020-11-21 – 2020-11-22 (×2): 100 mg via INTRAVENOUS
  Filled 2020-11-20 (×2): qty 20

## 2020-11-20 MED ORDER — SODIUM CHLORIDE 0.9 % IV SOLN
200.0000 mg | Freq: Once | INTRAVENOUS | Status: DC
  Filled 2020-11-20: qty 40

## 2020-11-20 MED ORDER — INSULIN ASPART 100 UNIT/ML ~~LOC~~ SOLN: 0.0000 [IU] | Freq: Every day | SUBCUTANEOUS | Status: DC

## 2020-11-20 MED ORDER — ONDANSETRON HCL 4 MG PO TABS: 4.0000 mg | ORAL_TABLET | Freq: Four times a day (QID) | ORAL | Status: DC | PRN

## 2020-11-20 NOTE — H&P (Signed)
History and Physical    Rebecca Hardy TDD:[id number] DOB: 08/26/1933 DOA: 11/20/2020  PCP: Bonnita Nasuti, MD   [facility] from: [facility]  Chief Complaint: Hypotension  HPI: Rebecca Hardy is a 87 y.o. female with medical history significant for type 2 diabetes, dyslipidemia, hypertension, and prior TIA, who was brought to the ED via EMS from her ALF on account of hypotension noted on vital signs check this a.m.  She has otherwise been in her normal state of health with no other symptoms identified.  Currently she has been eating and drinking well, but did have one loose bowel movement overnight.  She otherwise remains on her home Lasix daily as prescribed.  No complaint of chest pain, shortness of breath, abdominal pain, nausea, vomiting, fevers, or chills noted.   ED Course: Vital signs have demonstrated improvement in blood pressure readings after 800 cc of normal saline given in route by EMS.  Her blood pressures have otherwise remained stable, but creatinine is elevated at 3.05 with baseline normally 1.3.  Sodium is 130, BUN 66 and hemoglobin 11.1.  Platelets are 99,000.  She was given 1 L LR bolus in the ED.  Covid testing and lactic acid pending. UA pending.  Review of Systems: Reviewed as noted above, otherwise negative.  Past Medical History:  Diagnosis Date  . 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 Diabetes mellitus   . GERD (gastroesophageal reflux disease)   . High cholesterol   . History of TIA (transient ischemic attack)   . Hypertension   . Stroke [facility])     Past Surgical History:  Procedure Laterality Date  . ABDOMINAL HYSTERECTOMY    . BREAST LUMPECTOMY    . CESAREAN SECTION    . LOOP RECORDER INSERTION N/A 08/23/2018   Procedure: LOOP RECORDER INSERTION;  Surgeon: Evans Lance, MD;  Location: [facility];  Service: Cardiovascular;  Laterality: N/A;  . TEE WITHOUT CARDIOVERSION N/A 08/23/2018   Procedure: TRANSESOPHAGEAL  ECHOCARDIOGRAM (TEE);  Surgeon: Dorothy Spark, MD;  Location: [facility] ENDOSCOPY;  Service: Cardiovascular;  Laterality: N/A;     reports that she has never smoked. She has never used smokeless tobacco. She reports that she does not drink alcohol and does not use drugs.  Allergies  Allergen Reactions  . Codeine Other (See Comments)    Unknown reaction  . Penicillins Other (See Comments)    Unknown reaction  Has patient had a PCN reaction causing immediate rash, facial/tongue/throat swelling, SOB or lightheadedness with hypotension: Uknown Has patient had a PCN reaction causing severe rash involving mucus membranes or skin necrosis: Uknown Has patient had a PCN reaction that required hospitalization: Unknown Has patient had a PCN reaction occurring within the last 10 years: No If all of the above answers are "NO", then may proceed with Cephalosporin use.    Family History  Problem Relation Age of Onset  . Cancer Mother   . Hypertension Father     Prior to Admission medications   Medication Sig Start Date End Date Taking? Authorizing Provider  Ergocalciferol (VITAMIN D2) 50 MCG (2000 UT) TABS Take 1 capsule by mouth daily.   Yes [provider]  furosemide (LASIX) 40 MG tablet Take 20 mg by mouth daily.   Yes [provider]  linagliptin (TRADJENTA) 5 MG TABS tablet Take 5 mg by mouth daily.   Yes [provider]  lisinopril (PRINIVIL,ZESTRIL) 20 MG tablet Take 20  mg by mouth daily. 12/01/18  Yes [provider]  loratadine (CLARITIN) 10 MG tablet Take 10 mg by mouth daily.   Yes [provider]  LORazepam (ATIVAN) 0.5 MG tablet Take 0.5 mg by mouth daily as needed for anxiety.   Yes [provider]  metoprolol succinate (TOPROL-XL) 25 MG 24 hr tablet Take 25 mg by mouth daily.   Yes [provider]  rosuvastatin (CRESTOR) 20 MG tablet Take 20 mg by mouth daily.  07/16/18  Yes [provider]  sertraline (ZOLOFT) 25  MG tablet Take 25 mg by mouth at bedtime.  03/27/15  Yes [provider]  aspirin 325 MG tablet Take 1 tablet (325 mg total) by mouth daily. Patient not taking: No sig reported 08/24/18   Samuella Cota, MD  metoprolol succinate (TOPROL-XL) 50 MG 24 hr tablet TAKE (1) TABLET BY MOUTH DAILY. TAKE WITH OR IMMEDIATELY FOLLOWING A MEAL. Patient not taking: No sig reported 11/10/19   Evans Lance, MD  TOUJEO SOLOSTAR 300 UNIT/ML SOPN Inject 42 Units into the skin daily. Prime pen with 3u prior to each use 05/21/16   [provider]    Physical Exam: Vitals:   11/20/20 1100 11/20/20 1110 11/20/20 1115 11/20/20 1204  BP: (!) 89/37  (!) 101/56 (!) 102/49  Pulse: 72  72 74  Resp: 14  20 17   Temp:  98 F (36.7 C)    TempSrc:  Oral    SpO2: 99%  96% 100%    Constitutional: NAD, calm, comfortable Vitals:   11/20/20 1100 11/20/20 1110 11/20/20 1115 11/20/20 1204  BP: (!) 89/37  (!) 101/56 (!) 102/49  Pulse: 72  72 74  Resp: 14  20 17   Temp:  98 F (36.7 C)    TempSrc:  Oral    SpO2: 99%  96% 100%   Eyes: lids and conjunctivae normal Mucous membranes appear dry Neck: normal, supple Respiratory: clear to auscultation bilaterally. Normal respiratory effort. No accessory muscle use.  Cardiovascular: Regular rate and rhythm, no murmurs. Abdomen: no tenderness, no distention. Bowel sounds positive.  Musculoskeletal:  No edema. Skin: no rashes, lesions, ulcers.  Psychiatric: Flat affect  Labs on Admission: I have personally reviewed following labs and imaging studies  CBC: Recent Labs  Lab 11/20/20 1138  WBC 8.3  NEUTROABS 5.4  HGB 11.1*  HCT 36.4  MCV 94.5  PLT 99*   Basic Metabolic Panel: Recent Labs  Lab 11/20/20 1138  NA 130*  K 4.4  CL 104  CO2 18*  GLUCOSE 212*  BUN 66*  CREATININE 3.05*  CALCIUM 8.4*   GFR: CrCl cannot be calculated (Unknown ideal weight.). Liver Function Tests: Recent Labs  Lab 11/20/20 1138  AST 29  ALT 20  ALKPHOS 56   BILITOT 0.7  PROT 6.1*  ALBUMIN 3.4*   No results for input(s): LIPASE, AMYLASE in the last 168 hours. No results for input(s): AMMONIA in the last 168 hours. Coagulation Profile: No results for input(s): INR, PROTIME in the last 168 hours. Cardiac Enzymes: No results for input(s): CKTOTAL, CKMB, CKMBINDEX, TROPONINI in the last 168 hours. BNP (last 3 results) No results for input(s): PROBNP in the last 8760 hours. HbA1C: No results for input(s): HGBA1C in the last 72 hours. CBG: No results for input(s): GLUCAP in the last 168 hours. Lipid Profile: No results for input(s): CHOL, HDL, LDLCALC, TRIG, CHOLHDL, LDLDIRECT in the last 72 hours. Thyroid Function Tests: No results for input(s): TSH, T4TOTAL, FREET4,  T3FREE, THYROIDAB in the last 72 hours. Anemia Panel: No results for input(s): VITAMINB12, FOLATE, FERRITIN, TIBC, IRON, RETICCTPCT in the last 72 hours. Urine analysis:    Component Value Date/Time   COLORURINE STRAW (A) 10/03/2018 0512   APPEARANCEUR CLEAR 10/03/2018 0512   LABSPEC 1.006 10/03/2018 0512   PHURINE 5.0 10/03/2018 0512   GLUCOSEU NEGATIVE 10/03/2018 0512   HGBUR NEGATIVE 10/03/2018 0512   BILIRUBINUR NEGATIVE 10/03/2018 0512   KETONESUR NEGATIVE 10/03/2018 0512   PROTEINUR NEGATIVE 10/03/2018 0512   UROBILINOGEN 0.2 04/29/2011 1847   NITRITE NEGATIVE 10/03/2018 0512   LEUKOCYTESUR NEGATIVE 10/03/2018 0512    Radiological Exams on Admission: No results found.   Assessment/Plan Active Problems:   AKI (acute kidney injury) ([place])    Hypotension related to dehydration -Currently corrected with IV fluid -Avoid home antihypertensive agents in the meantime -Continue to monitor closely  AKI related to above -Avoid home Lasix and ACE inhibitor -IV fluid -Strict input and output -Daily labs -Urine electrolytes -UA pending  Mild anemia -Plan to check anemia panel -Could consider FOBT if further downward trending CBC  noted  Thrombocytopenia -Avoid heparin agents -Monitor CBC  Hyponatremia -Likely related to dehydration -Maintain on IV normal saline -Follow labs  History of type 2 diabetes -SSI -Carb modified diet  History of hypertension -Plan to hold Lasix and ACE inhibitor as noted above -Currently with soft blood pressure readings, continue to monitor  Dyslipidemia/prior TIA -Maintain on rosuvastatin   DVT prophylaxis: SCDs Code Status: Full Family Communication: Discussed with daughter [name] on phone 2/1 Disposition Plan:Admit for IVF hydration Consults called:None Admission status: Obs, Tele  Margherita Collyer D Manuella Ghazi DO [facility]  If 7PM-7AM, please contact night-coverage [email or web address]  11/20/2020, 1:12 PM

## 2020-11-20 NOTE — ED Provider Notes (Signed)
[facility] EMERGENCY DEPARTMENT Provider Note  CSN: [id number] Arrival date & time: 11/20/20 1036    History Chief Complaint  Patient presents with  . Hypotension    HPI  Rebecca Hardy is a 87 y.o. female brought to the ED from local SNF after she was noted to be hypotensive at the facility this morning. By EMS report she has been in her normal state of health recently. The patient does not provide much additional history but she denies any current symptoms. She was given 800cc NS prior to arrival with improvement in pressure.    Past Medical History:  Diagnosis Date  . 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 Diabetes mellitus   . GERD (gastroesophageal reflux disease)   . High cholesterol   . History of TIA (transient ischemic attack)   . Hypertension   . Stroke [facility])     Past Surgical History:  Procedure Laterality Date  . ABDOMINAL HYSTERECTOMY    . BREAST LUMPECTOMY    . CESAREAN SECTION    . LOOP RECORDER INSERTION N/A 08/23/2018   Procedure: LOOP RECORDER INSERTION;  Surgeon: Evans Lance, MD;  Location: [facility];  Service: Cardiovascular;  Laterality: N/A;  . TEE WITHOUT CARDIOVERSION N/A 08/23/2018   Procedure: TRANSESOPHAGEAL ECHOCARDIOGRAM (TEE);  Surgeon: Dorothy Spark, MD;  Location: [facility] ENDOSCOPY;  Service: Cardiovascular;  Laterality: N/A;    Family History  Problem Relation Age of Onset  . Cancer Mother   . Hypertension Father     Social History   Tobacco Use  . Smoking status: Never Smoker  . Smokeless tobacco: Never Used  Substance Use Topics  . Alcohol use: No  . Drug use: No     Home Medications Prior to Admission medications   Medication Sig Start Date End Date Taking? Authorizing Provider  aspirin 325 MG tablet Take 1 tablet (325 mg total) by mouth daily. 08/24/18   Samuella Cota, MD  ezetimibe (ZETIA) 10 MG tablet Take 10 mg by mouth daily.  08/11/18   [provider]  furosemide  (LASIX) 40 MG tablet Take 40 mg by mouth daily.    [provider]  lisinopril (PRINIVIL,ZESTRIL) 20 MG tablet  12/01/18   [provider]  metoprolol succinate (TOPROL-XL) 25 MG 24 hr tablet Take 25 mg by mouth daily.    [provider]  metoprolol succinate (TOPROL-XL) 50 MG 24 hr tablet TAKE (1) TABLET BY MOUTH DAILY. TAKE WITH OR IMMEDIATELY FOLLOWING A MEAL. 11/10/19   Evans Lance, MD  rosuvastatin (CRESTOR) 20 MG tablet Take 20 mg by mouth daily.  07/16/18   [provider]  sertraline (ZOLOFT) 25 MG tablet Take 25 mg by mouth at bedtime.  03/27/15   [provider]  TOUJEO SOLOSTAR 300 UNIT/ML SOPN Inject 45 Units into the skin daily.  05/21/16   [provider]     Allergies    Codeine and Penicillins   Review of Systems   Review of Systems Unable to assess due to mental status.   Physical Exam BP (!) 102/49   Pulse 74   Temp 98 F (36.7 C) (Oral)   Resp 17   SpO2 100%   Physical Exam Vitals and nursing note reviewed.  Constitutional:      Appearance: Normal appearance.  HENT:     Head: Normocephalic and atraumatic.     Nose: Nose normal.     Mouth/Throat:  Mouth: Mucous membranes are dry.  Eyes:     Extraocular Movements: Extraocular movements intact.     Conjunctiva/sclera: Conjunctivae normal.  Cardiovascular:     Rate and Rhythm: Normal rate.  Pulmonary:     Effort: Pulmonary effort is normal.     Breath sounds: Normal breath sounds.  Abdominal:     General: Abdomen is flat.     Palpations: Abdomen is soft.     Tenderness: There is no abdominal tenderness.  Musculoskeletal:        General: No swelling. Normal range of motion.     Cervical back: Neck supple.  Skin:    General: Skin is warm and dry.  Neurological:     General: No focal deficit present.     Mental Status: She is alert.  Psychiatric:        Mood and Affect: Mood normal.      ED Results / Procedures / Treatments   Labs (all  labs ordered are listed, but only abnormal results are displayed) Labs Reviewed  COMPREHENSIVE METABOLIC PANEL - Abnormal; Notable for the following components:      Result Value   Sodium 130 (*)    CO2 18 (*)    Glucose, Bld 212 (*)    BUN 66 (*)    Creatinine, Ser 3.05 (*)    Calcium 8.4 (*)    Total Protein 6.1 (*)    Albumin 3.4 (*)    GFR, Estimated 14 (*)    All other components within normal limits  CBC WITH DIFFERENTIAL/PLATELET - Abnormal; Notable for the following components:   RBC 3.85 (*)    Hemoglobin 11.1 (*)    Platelets 99 (*)    All other components within normal limits  SARS CORONAVIRUS 2 (TAT 6-24 HRS)  URINALYSIS, ROUTINE W REFLEX MICROSCOPIC  LACTIC ACID, PLASMA  LACTIC ACID, PLASMA    EKG None  Radiology No results found.  Procedures Procedures  Medications Ordered in the ED Medications  lactated ringers bolus 1,000 mL (1,000 mLs Intravenous New Bag/Given 11/20/20 1123)     MDM Rules/Calculators/A&P MDM Patient with low BP of unclear etiology. She denies any recent illness. She has dry mucous membranes and repeat BP after arrival is lower. Will give additional IVF and check basic labs.  ED Course  I have reviewed the triage vital signs and the nursing notes.  Pertinent labs & imaging results that were available during my care of the patient were reviewed by me and considered in my medical decision making (see chart for details).  Clinical Course as of 11/20/20 1227  Tue Nov 20, 2020  1203 CMP shows AKI and hyponatremia, likely pre-renal given her dry mouth. Will continue with IVF and anticipate admission.  [CS]  1205 CBC with mild anemia, about at baseline  [CS]  1216 Per SNF staff, patient has been in her normal state of health, eating and drinking normally. Had one reported loose stool this morning. Per their records her Cr was 1.7. Will discuss with hospitalist for admission.  [CS]  1227 Spoke with Dr. Manuella Ghazi, Hospitalist, who will evaluate  for admission. Covid ordered. Dr. Manuella Ghazi also requests a lactic acid.  [CS]    Clinical Course User Index [CS] Truddie Hidden, MD    Final Clinical Impression(s) / ED Diagnoses Final diagnoses:  AKI (acute kidney injury) [facility])    Rx / [place] Orders ED Discharge Orders    None       Truddie Hidden, MD  11/20/20 1227  

## 2020-11-20 NOTE — ED Triage Notes (Signed)
Per EMS pt was hypotensive upon arrival to [facility] assisted living. Pts BP was 65/34. Pt received 864ml Nacl en route by EMS.

## 2020-11-20 NOTE — ED Notes (Signed)
Date and time results received: 11/20/20 2:51 PM   Test: COVID Critical Value: covid positive   Name of Provider Notified: Manuella Ghazi DO  Orders Received? Or Actions Taken? MD notified

## 2020-11-21 DIAGNOSIS — Z8249 Family history of ischemic heart disease and other diseases of the circulatory system: Secondary | ICD-10-CM | POA: Diagnosis not present

## 2020-11-21 DIAGNOSIS — E1122 Type 2 diabetes mellitus with diabetic chronic kidney disease: Secondary | ICD-10-CM | POA: Diagnosis present

## 2020-11-21 DIAGNOSIS — D696 Thrombocytopenia, unspecified: Secondary | ICD-10-CM | POA: Diagnosis not present

## 2020-11-21 DIAGNOSIS — U071 COVID-19: Secondary | ICD-10-CM | POA: Diagnosis not present

## 2020-11-21 DIAGNOSIS — Z7401 Bed confinement status: Secondary | ICD-10-CM | POA: Diagnosis not present

## 2020-11-21 DIAGNOSIS — D649 Anemia, unspecified: Secondary | ICD-10-CM | POA: Diagnosis present

## 2020-11-21 DIAGNOSIS — E785 Hyperlipidemia, unspecified: Secondary | ICD-10-CM | POA: Diagnosis present

## 2020-11-21 DIAGNOSIS — R5381 Other malaise: Secondary | ICD-10-CM | POA: Diagnosis not present

## 2020-11-21 DIAGNOSIS — F419 Anxiety disorder, unspecified: Secondary | ICD-10-CM | POA: Diagnosis not present

## 2020-11-21 DIAGNOSIS — R404 Transient alteration of awareness: Secondary | ICD-10-CM | POA: Diagnosis not present

## 2020-11-21 DIAGNOSIS — I129 Hypertensive chronic kidney disease with stage 1 through stage 4 chronic kidney disease, or unspecified chronic kidney disease: Secondary | ICD-10-CM | POA: Diagnosis present

## 2020-11-21 DIAGNOSIS — N179 Acute kidney failure, unspecified: Secondary | ICD-10-CM | POA: Diagnosis not present

## 2020-11-21 DIAGNOSIS — E871 Hypo-osmolality and hyponatremia: Secondary | ICD-10-CM | POA: Diagnosis present

## 2020-11-21 DIAGNOSIS — N1832 Chronic kidney disease, stage 3b: Secondary | ICD-10-CM

## 2020-11-21 DIAGNOSIS — E860 Dehydration: Secondary | ICD-10-CM | POA: Diagnosis present

## 2020-11-21 DIAGNOSIS — Z8673 Personal history of transient ischemic attack (TIA), and cerebral infarction without residual deficits: Secondary | ICD-10-CM | POA: Diagnosis not present

## 2020-11-21 DIAGNOSIS — I959 Hypotension, unspecified: Secondary | ICD-10-CM | POA: Diagnosis not present

## 2020-11-21 DIAGNOSIS — Z885 Allergy status to narcotic agent status: Secondary | ICD-10-CM | POA: Diagnosis not present

## 2020-11-21 DIAGNOSIS — Z880 Allergy status to penicillin: Secondary | ICD-10-CM | POA: Diagnosis not present

## 2020-11-21 DIAGNOSIS — E11649 Type 2 diabetes mellitus with hypoglycemia without coma: Secondary | ICD-10-CM | POA: Diagnosis present

## 2020-11-21 DIAGNOSIS — Z7982 Long term (current) use of aspirin: Secondary | ICD-10-CM | POA: Diagnosis not present

## 2020-11-21 DIAGNOSIS — E7800 Pure hypercholesterolemia, unspecified: Secondary | ICD-10-CM | POA: Diagnosis present

## 2020-11-21 DIAGNOSIS — Z86000 Personal history of in-situ neoplasm of breast: Secondary | ICD-10-CM | POA: Diagnosis not present

## 2020-11-21 DIAGNOSIS — I69354 Hemiplegia and hemiparesis following cerebral infarction affecting left non-dominant side: Secondary | ICD-10-CM | POA: Diagnosis not present

## 2020-11-21 DIAGNOSIS — F32A Depression, unspecified: Secondary | ICD-10-CM | POA: Diagnosis not present

## 2020-11-21 DIAGNOSIS — Z794 Long term (current) use of insulin: Secondary | ICD-10-CM | POA: Diagnosis not present

## 2020-11-21 DIAGNOSIS — Z79899 Other long term (current) drug therapy: Secondary | ICD-10-CM | POA: Diagnosis not present

## 2020-11-21 DIAGNOSIS — K219 Gastro-esophageal reflux disease without esophagitis: Secondary | ICD-10-CM | POA: Diagnosis present

## 2020-11-21 DIAGNOSIS — I10 Essential (primary) hypertension: Secondary | ICD-10-CM | POA: Diagnosis not present

## 2020-11-21 LAB — CBC WITH DIFFERENTIAL/PLATELET
Abs Immature Granulocytes: 0.03 10*3/uL (ref 0.00–0.07)
Basophils Absolute: 0 10*3/uL (ref 0.0–0.1)
Basophils Relative: 1 %
Eosinophils Absolute: 0.3 10*3/uL (ref 0.0–0.5)
Eosinophils Relative: 3 %
HCT: 32.5 % — ABNORMAL LOW (ref 36.0–46.0)
Hemoglobin: 10.5 g/dL — ABNORMAL LOW (ref 12.0–15.0)
Immature Granulocytes: 0 %
Lymphocytes Relative: 34 %
Lymphs Abs: 2.7 10*3/uL (ref 0.7–4.0)
MCH: 28.8 pg (ref 26.0–34.0)
MCHC: 32.3 g/dL (ref 30.0–36.0)
MCV: 89.3 fL (ref 80.0–100.0)
Monocytes Absolute: 0.7 10*3/uL (ref 0.1–1.0)
Monocytes Relative: 8 %
Neutro Abs: 4.3 10*3/uL (ref 1.7–7.7)
Neutrophils Relative %: 54 %
Platelets: 114 10*3/uL — ABNORMAL LOW (ref 150–400)
RBC: 3.64 MIL/uL — ABNORMAL LOW (ref 3.87–5.11)
RDW: 12.5 % (ref 11.5–15.5)
WBC: 7.9 10*3/uL (ref 4.0–10.5)
nRBC: 0 % (ref 0.0–0.2)

## 2020-11-21 LAB — COMPREHENSIVE METABOLIC PANEL
ALT: 18 U/L (ref 0–44)
AST: 24 U/L (ref 15–41)
Albumin: 3 g/dL — ABNORMAL LOW (ref 3.5–5.0)
Alkaline Phosphatase: 50 U/L (ref 38–126)
Anion gap: 9 (ref 5–15)
BUN: 58 mg/dL — ABNORMAL HIGH (ref 8–23)
CO2: 20 mmol/L — ABNORMAL LOW (ref 22–32)
Calcium: 8.5 mg/dL — ABNORMAL LOW (ref 8.9–10.3)
Chloride: 107 mmol/L (ref 98–111)
Creatinine, Ser: 2.15 mg/dL — ABNORMAL HIGH (ref 0.44–1.00)
GFR, Estimated: 22 mL/min — ABNORMAL LOW (ref >60)
Glucose, Bld: 126 mg/dL — ABNORMAL HIGH (ref 70–99)
Potassium: 4.1 mmol/L (ref 3.5–5.1)
Sodium: 136 mmol/L (ref 135–145)
Total Bilirubin: 0.5 mg/dL (ref 0.3–1.2)
Total Protein: 5.6 g/dL — ABNORMAL LOW (ref 6.5–8.1)

## 2020-11-21 LAB — SODIUM, URINE, RANDOM: Sodium, Ur: 51 mmol/L

## 2020-11-21 LAB — GLUCOSE, CAPILLARY
Glucose-Capillary: 107 mg/dL — ABNORMAL HIGH (ref 70–99)
Glucose-Capillary: 94 mg/dL (ref 70–99)
Glucose-Capillary: 77 mg/dL (ref 70–99)
Glucose-Capillary: 84 mg/dL (ref 70–99)

## 2020-11-21 LAB — CREATININE, URINE, RANDOM: Creatinine, Urine: 106.12 mg/dL

## 2020-11-21 LAB — D-DIMER, QUANTITATIVE: D-Dimer, Quant: 9.93 ug/mL-FEU — ABNORMAL HIGH (ref 0.00–0.50)

## 2020-11-21 LAB — MAGNESIUM: Magnesium: 1.8 mg/dL (ref 1.7–2.4)

## 2020-11-21 LAB — C-REACTIVE PROTEIN: CRP: 0.5 mg/dL (ref <1.0)

## 2020-11-21 LAB — FERRITIN: Ferritin: 195 ng/mL (ref 11–307)

## 2020-11-21 MED ORDER — SACCHAROMYCES BOULARDII 250 MG PO CAPS
250.0000 mg | ORAL_CAPSULE | Freq: Two times a day (BID) | ORAL | Status: DC
  Administered 2020-11-21 – 2020-11-22 (×2): 250 mg via ORAL
  Filled 2020-11-21 (×3): qty 1

## 2020-11-21 NOTE — Plan of Care (Signed)

## 2020-11-21 NOTE — Progress Notes (Signed)
PROGRESS NOTE    [name]  [id number] DOB: 31-Jan-1933 DOA: 11/20/2020 PCP: Bonnita Nasuti, MD   Chief Complaint  Patient presents with  . Hypotension    Brief admission narrative:  Rebecca Hardy is a 87 y.o. female with medical history significant for type 2 diabetes, dyslipidemia, hypertension, and prior TIA, who was brought to the ED via EMS from her ALF on account of hypotension noted on vital signs check this a.m.  She has otherwise been in her normal state of health with no other symptoms identified.  Currently she has been eating and drinking well, but did have one loose bowel movement overnight.  She otherwise remains on her home Lasix daily as prescribed.  No complaint of chest pain, shortness of breath, abdominal pain, nausea, vomiting, fevers, or chills noted.   Assessment & Plan: 1-dehydration/hypotension -Improve with IV fluids -Continue to follow vital signs -Continue to hold home antihypertensive agents -Continue to maintain adequate hydration.  2-acute on chronic renal failure: Patient with stage IIIb at baseline -Creatinine 1.3-1.6. -On presentation creatinine peaked at 3.05 -Continue holding nephrotoxic agents, avoid the use of contrast, avoid hypotension and maintain adequate hydration -Patient denies dysuria -Close monitoring of patient's urine output. -Follow-up renal function trend.;  Creatinine trending down, currently 2.15  3-essential hypertension -Patient presented with hypotension -Continue holding antihypertensive agents at this time.  4-incidental Covid infection -No complaining of shortness of breath, no having fevers or any respiratory distress -Good saturation on room air -Patient will complete a total of 3 days of remdesivir infusion -No steroids required.  5-hyponatremia -In the setting of dehydration -Continue IV fluids and follow electrolytes trend.  6-type 2 diabetes with nephropathy -Continue sliding scale  insulin -Modified carbohydrate diet has been ordered -Follow CBGs.  7-thrombocytopenia -Follow platelets count -No overt bleeding -Avoid the use of heparin products  8-Dyslipidemia/prior hx of TIA -will resume ASA in am (allowing further improvement in renal function)  -continue statins  9-depression/anxiety -continue zoloft   DVT prophylaxis: SCDs Code Status: Full code. Family Communication: No family at bedside. Disposition:   Status is: Inpatient  Dispo: The patient is from: ALF              Anticipated d/c is to: Back to ALF with home health services              Anticipated d/c date is: 1-2 days.              Patient currently no medically stable for discharge; renal function even improve still above baseline and the patient will continue requiring IV fluids.  Continue minimizing nephrotoxic agents and complete remdesivir infusion (3 days dosages for incidental positive Covid).  Continue to follow renal function trend and maintain adequate hydration.  Physical therapy evaluation has been requested.    Consultants:   None   Procedures:  See below for x-ray reports.   Antimicrobials/antiviral Remdesivir   Subjective: Afebrile, no chest pain, no nausea or vomiting.  Good saturation on room air.  Reports feeling weak and tired.  Objective: Vitals:   11/21/20 0217 11/21/20 0522 11/21/20 0652 11/21/20 1416  BP: 111/66 124/65 106/74 (!) 101/40  Pulse: 99 (!) 129 (!) 127 77  Resp: 19 20 20    Temp:   98.5 F (36.9 C) 99.3 F (37.4 C)  TempSrc:    Oral  SpO2: 100% 98% 98% 100%  Weight:        Intake/Output Summary (Last 24 hours) at 11/21/2020 1559 Last  data filed at 11/21/2020 1500 Gross per 24 hour  Intake 1800.88 ml  Output -  Net 1800.88 ml   Filed Weights   11/20/20 2236  Weight: 74 kg    Examination:  General exam: Appears calm and comfortable, no chest pain, no nausea, no vomiting.  Reports feeling weak and tired.  No requiring oxygen  supplementation.  Good saturation on room air.  Per nursing reports still having some intermittent loose stools. Respiratory system: Clear to auscultation. Respiratory effort normal.  No using accessory muscles. Cardiovascular system: S1 & S2 heard, rate controlled; no rubs, no gallops, no JVD. Gastrointestinal system: Abdomen is nondistended, soft and nontender. No organomegaly or masses felt. Normal bowel sounds heard. Central nervous system: No focal neurological deficits.  Following commands appropriately. Extremities: No cyanosis or clubbing. Skin: No petechiae. Psychiatry: Mood & affect appropriate.     Data Reviewed: I have personally reviewed following labs and imaging studies  CBC: Recent Labs  Lab 11/20/20 1138 11/21/20 0519  WBC 8.3 7.9  NEUTROABS 5.4 4.3  HGB 11.1* 10.5*  HCT 36.4 32.5*  MCV 94.5 89.3  PLT 99* 114*    Basic Metabolic Panel: Recent Labs  Lab 11/20/20 1138 11/21/20 0519  NA 130* 136  K 4.4 4.1  CL 104 107  CO2 18* 20*  GLUCOSE 212* 126*  BUN 66* 58*  CREATININE 3.05* 2.15*  CALCIUM 8.4* 8.5*  MG  --  1.8    GFR: CrCl cannot be calculated (Unknown ideal weight.).  Liver Function Tests: Recent Labs  Lab 11/20/20 1138 11/21/20 0519  AST 29 24  ALT 20 18  ALKPHOS 56 50  BILITOT 0.7 0.5  PROT 6.1* 5.6*  ALBUMIN 3.4* 3.0*    CBG: Recent Labs  Lab 11/20/20 1802 11/20/20 2227 11/21/20 0758 11/21/20 1129  GLUCAP 146* 150* 107* 94     Recent Results (from the past 240 hour(s))  SARS Coronavirus 2 by RT PCR (hospital order, performed in [place] hospital lab) Nasopharyngeal Nasopharyngeal Swab     Status: Abnormal   Collection Time: 11/20/20  1:41 PM   Specimen: Nasopharyngeal Swab  Result Value Ref Range Status   SARS Coronavirus 2 POSITIVE (A) NEGATIVE Final    Comment: CRITICAL RESULT CALLED TO, READ BACK BY AND VERIFIED WITH: HASTINGS,K. RN @1452  11/20/20 BILLINGSLEY,L (NOTE) SARS-CoV-2 target nucleic acids are  DETECTED  SARS-CoV-2 RNA is generally detectable in upper respiratory specimens  during the acute phase of infection.  Positive results are indicative  of the presence of the identified virus, but do not rule out bacterial infection or co-infection with other pathogens not detected by the test.  Clinical correlation with patient history and  other diagnostic information is necessary to determine patient infection status.  The expected result is negative.  Fact Sheet for Patients:   StrictlyIdeas.no   Fact Sheet for Healthcare Providers:   BankingDealers.co.za    This test is not yet approved or cleared by the [place] FDA and  has been authorized for detection and/or diagnosis of SARS-CoV-2 by FDA under an Emergency Use Authorization (EUA).  This EUA will remain in effect  (meaning this test can be used) for the duration of  the COVID-19 declaration under Section 564(b)(1) of the Act, 21 U.S.C. section 360-bbb-3(b)(1), unless the authorization is terminated or revoked sooner.  Performed at [facility], [address], [place], [place] [address]      Radiology Studies: No results found.   Scheduled Meds: . insulin aspart  0-5 Units Subcutaneous QHS  . insulin aspart  0-9 Units Subcutaneous TID WC  . rosuvastatin  20 mg Oral q1800  . sertraline  25 mg Oral QHS   Continuous Infusions: . sodium chloride 75 mL/hr at 11/21/20 0831  . remdesivir 100 mg in NS 100 mL 100 mg (11/21/20 0841)     LOS: 0 days    Time spent: 30 minutes    Barton Dubois, MD [facility]   To contact the attending provider between 7A-7P or the covering provider during after hours 7P-7A, please log into the web site [email or web address] and access using universal [place] password for that web site. If you do not have the password, please call the hospital operator.  11/21/2020, 3:59 PM

## 2020-11-21 NOTE — Care Management Obs Status (Addendum)
[place] NOTIFICATION   Patient Details  Name: Rebecca Hardy MRN: [id number] Date of Birth: 09-09-1933   Medicare Observation Status Notification Given:  Yes Spoke with daughter [name], mailed to [place], [place] [address]   [name] 11/21/2020, 3:49 PM

## 2020-11-22 DIAGNOSIS — U071 COVID-19: Secondary | ICD-10-CM

## 2020-11-22 DIAGNOSIS — N1832 Chronic kidney disease, stage 3b: Secondary | ICD-10-CM

## 2020-11-22 DIAGNOSIS — I10 Essential (primary) hypertension: Secondary | ICD-10-CM

## 2020-11-22 DIAGNOSIS — Z8673 Personal history of transient ischemic attack (TIA), and cerebral infarction without residual deficits: Secondary | ICD-10-CM

## 2020-11-22 DIAGNOSIS — D696 Thrombocytopenia, unspecified: Secondary | ICD-10-CM

## 2020-11-22 LAB — CBC WITH DIFFERENTIAL/PLATELET
Abs Immature Granulocytes: 0.02 10*3/uL (ref 0.00–0.07)
Basophils Absolute: 0 10*3/uL (ref 0.0–0.1)
Basophils Relative: 1 %
Eosinophils Absolute: 0.3 10*3/uL (ref 0.0–0.5)
Eosinophils Relative: 5 %
HCT: 31.6 % — ABNORMAL LOW (ref 36.0–46.0)
Hemoglobin: 10.2 g/dL — ABNORMAL LOW (ref 12.0–15.0)
Immature Granulocytes: 0 %
Lymphocytes Relative: 37 %
Lymphs Abs: 2.4 10*3/uL (ref 0.7–4.0)
MCH: 29 pg (ref 26.0–34.0)
MCHC: 32.3 g/dL (ref 30.0–36.0)
MCV: 89.8 fL (ref 80.0–100.0)
Monocytes Absolute: 0.6 10*3/uL (ref 0.1–1.0)
Monocytes Relative: 10 %
Neutro Abs: 3 10*3/uL (ref 1.7–7.7)
Neutrophils Relative %: 47 %
Platelets: 108 10*3/uL — ABNORMAL LOW (ref 150–400)
RBC: 3.52 MIL/uL — ABNORMAL LOW (ref 3.87–5.11)
RDW: 13 % (ref 11.5–15.5)
WBC: 6.4 10*3/uL (ref 4.0–10.5)
nRBC: 0 % (ref 0.0–0.2)

## 2020-11-22 LAB — COMPREHENSIVE METABOLIC PANEL
ALT: 16 U/L (ref 0–44)
AST: 22 U/L (ref 15–41)
Albumin: 2.9 g/dL — ABNORMAL LOW (ref 3.5–5.0)
Alkaline Phosphatase: 47 U/L (ref 38–126)
Anion gap: 5 (ref 5–15)
BUN: 45 mg/dL — ABNORMAL HIGH (ref 8–23)
CO2: 21 mmol/L — ABNORMAL LOW (ref 22–32)
Calcium: 8.4 mg/dL — ABNORMAL LOW (ref 8.9–10.3)
Chloride: 113 mmol/L — ABNORMAL HIGH (ref 98–111)
Creatinine, Ser: 1.49 mg/dL — ABNORMAL HIGH (ref 0.44–1.00)
GFR, Estimated: 34 mL/min — ABNORMAL LOW (ref >60)
Glucose, Bld: 88 mg/dL (ref 70–99)
Potassium: 4 mmol/L (ref 3.5–5.1)
Sodium: 139 mmol/L (ref 135–145)
Total Bilirubin: 0.5 mg/dL (ref 0.3–1.2)
Total Protein: 5.3 g/dL — ABNORMAL LOW (ref 6.5–8.1)

## 2020-11-22 LAB — D-DIMER, QUANTITATIVE: D-Dimer, Quant: 9.8 ug/mL-FEU — ABNORMAL HIGH (ref 0.00–0.50)

## 2020-11-22 LAB — GLUCOSE, CAPILLARY
Glucose-Capillary: 83 mg/dL (ref 70–99)
Glucose-Capillary: 107 mg/dL — ABNORMAL HIGH (ref 70–99)
Glucose-Capillary: 92 mg/dL (ref 70–99)
Glucose-Capillary: 103 mg/dL — ABNORMAL HIGH (ref 70–99)

## 2020-11-22 LAB — FERRITIN: Ferritin: 293 ng/mL (ref 11–307)

## 2020-11-22 LAB — C-REACTIVE PROTEIN: CRP: 0.6 mg/dL (ref <1.0)

## 2020-11-22 LAB — MAGNESIUM: Magnesium: 1.6 mg/dL — ABNORMAL LOW (ref 1.7–2.4)

## 2020-11-22 MED ORDER — FUROSEMIDE 40 MG PO TABS: 20.0000 mg | ORAL_TABLET | Freq: Every day | ORAL | Status: DC

## 2020-11-22 MED ORDER — SACCHAROMYCES BOULARDII 250 MG PO CAPS: 250.0000 mg | ORAL_CAPSULE | Freq: Two times a day (BID) | ORAL | 0 refills | Status: AC

## 2020-11-22 MED ORDER — METOPROLOL TARTRATE 5 MG/5ML IV SOLN
2.5000 mg | Freq: Once | INTRAVENOUS | Status: AC
  Administered 2020-11-22: 2.5 mg via INTRAVENOUS
  Filled 2020-11-22: qty 5

## 2020-11-22 MED ORDER — TOUJEO SOLOSTAR 300 UNIT/ML ~~LOC~~ SOPN: 25.0000 [IU] | PEN_INJECTOR | Freq: Every day | SUBCUTANEOUS | 2 refills | Status: DC

## 2020-11-22 NOTE — Progress Notes (Signed)
   11/22/20 1102  Assess: MEWS Score  Temp 98.5 F (36.9 C)  BP 136/62  Pulse Rate (!) 138  Resp 18  SpO2 100 %  O2 Device Room Air  Assess: MEWS Score  MEWS Temp 0  MEWS Systolic 0  MEWS Pulse 3  MEWS RR 0  MEWS LOC 0  MEWS Score 3  MEWS Score Color Yellow  Assess: if the MEWS score is Yellow or Red  Were vital signs taken at a resting state? Yes  Focused Assessment No change from prior assessment  Early Detection of Sepsis Score *See Row Information* Low  MEWS guidelines implemented *See Row Information* Yes  Treat  MEWS Interventions Administered prn meds/treatments  Pain Scale PAINAD  Breathing 0  Negative Vocalization 0  Facial Expression 0  Body Language 1  Consolability 0  PAINAD Score 1  Take Vital Signs  Increase Vital Sign Frequency  Yellow: Q 2hr X 2 then Q 4hr X 2, if remains yellow, continue Q 4hrs  Notify: Provider  Provider Name/Title Dr. Dyann Kief  Date Provider Notified 11/22/20  Notification Type Page  Notification Reason Change in status (pt maintaining heart rate in the 130's)  Response Other (Comment) (prn dose of ativan given, will restart her home meds)  Date of Provider Response 11/22/20

## 2020-11-22 NOTE — Plan of Care (Signed)

## 2020-11-22 NOTE — Discharge Summary (Signed)
Physician Discharge Summary  Rebecca Hardy:[id number] DOB: January 22, 1933 DOA: 11/20/2020  PCP: Bonnita Nasuti, MD  Admit date: 11/20/2020 Discharge date: 11/22/2020  Time spent: 35 minutes  Recommendations for Outpatient Follow-up:  Repeat basic metabolic panel in 5 days to follow electrolytes and renal function Maintain adequate hydration Arrange follow up with PCP in 10 days Reassess VS and adjust anti hypertensive meds as needed Follow-up blood sugar Levels and adjust hypoglycemic regimen as required   Discharge Diagnoses:  Active Problems:   Essential hypertension   AKI (acute kidney injury) ([place])   Acute renal failure superimposed on stage 3b chronic kidney disease ([place])   COVID-19 virus infection   Thrombocytopenia (HCC)   Hx of transient ischemic attack (TIA) History of nonhemorrhagic stroke Hyperlipidemia Type 2 diabetes with nephropathy.  Discharge Condition: Stable and improved.  Discharge back to assisted living facility supportive care and conditioning.  Outpatient follow-up with PCP recommended in 10 days.  CODE STATUS: Full code.  Diet recommendation: Modified carbohydrate/heart healthy diet.  Filed Weights   11/20/20 2236  Weight: 74 kg    History of present illness:   Rebecca C Mooreis a 87 y.o.femalewith medical history significant fortype 2 diabetes, dyslipidemia, hypertension, and prior TIA/CVA with left-sided residual deficits; who was brought to the ED via EMS from her ALF on account of hypotension noted on vital signs check this a.m. She has otherwise been in her normal state of health with no other symptoms identified. Currently she has been eating and drinking well, but did have one loose bowel movement overnight. She otherwise remains on her home Lasix daily as prescribed. No complaint of chest pain, shortness of breath, abdominal pain, nausea, vomiting, fevers, or chills noted.  Hospital Course:  1-dehydration/hypotension -Improved with IV  fluids -Continue to follow vital signs -Continue to maintain adequate hydration. -Follow adjusted dose of antihypertensive agents.  2-acute on chronic renal failure: Patient with stage IIIb at baseline -Creatinine 1.3-1.6. -On presentation creatinine peaked at 3.05 -Continue holding nephrotoxic agents, avoid the use of contrast, avoid hypotension and maintain adequate hydration. -Patient denies dysuria. -Close monitoring of patient's urine output and repeat BMET to follow renal function trend. Cr at discharge 1.49  3-essential hypertension -Patient presented with hypotension -Continue to hold some of her Antihypertensive medications at discharge -follow VS -heart healthy diet encouraged.  4-incidental Covid infection -No complaining of shortness of breath, no having fevers or any respiratory distress -Good saturation on room air. -Patient completed a total of 3 days of remdesivir infusion -No steroids required as patient was not hypoxic.Marland Kitchen  5-hyponatremia -In the setting of dehydration -Continue to maintain adequate hydration.  6-type 2 diabetes with nephropathy -Continue adjusted dose home insulin and resume Tradjenta. -Continue modified carbohydrate diet has been ordered -Follow CBGs and adjust hypoglycemic regimen as required..  7-thrombocytopenia -Continue to follow platelets count -No overt bleeding appreciated. -No heparin products were used during hospitalization.  8-Dyslipidemia/prior hx of TIA/history of nonhemorrhagic stroke  -Continue statins -Continue aspirin -Continue prophylactic medications  9-depression/anxiety -continue zoloft  10-physical deconditioning -Patient will be discharged back to assisted living facility for further care and conditioning.   Procedures: See below for x-ray reports. Consultations:  None  Discharge Exam: Vitals:   11/22/20 0552 11/22/20 1102  BP: (!) 98/54 136/62  Pulse: 70 (!) 138  Resp: 16 18  Temp: 98.1 F  (36.7 C) 98.5 F (36.9 C)  SpO2: 98% 100%    General: Afebrile, no chest pain, no nausea, no vomiting, no shortness  of breath. Nursing staff reported to stool overnight. No requiring oxygen supplementation otherwise feeling ready to return to her ALF. Cardiovascular: Rate controlled, no rubs, no gallops, no JVD on exam. Respiratory: Good air movement bilaterally; no using accessory muscles. Good saturation. Abdomen: Soft, nontender, nondistended,  Extremities: no cyanosis, no clubbing. Neurologic examination: demonstrated chronic left-sided residual deficit from previous stroke; no acute abnormalities. Poor balance and generalized deconditioning.  Discharge Instructions   Discharge Instructions    Diet - low sodium heart healthy   Complete by: As directed    Discharge instructions   Complete by: As directed    Repeat basic metabolic panel in 5 days to follow electrolytes and renal function Maintain adequate hydration Arrange follow up with PCP in 10 days Reassess VS and adjust anti hypertensive meds as needed Follow-up blood sugar Levels and adjust hypoglycemic regimen as required   Increase activity slowly   Complete by: As directed      Allergies as of 11/22/2020      Reactions   Codeine Other (See Comments)   Unknown reaction   Penicillins Other (See Comments)   Unknown reaction Has patient had a PCN reaction causing immediate rash, facial/tongue/throat swelling, SOB or lightheadedness with hypotension: Uknown Has patient had a PCN reaction causing severe rash involving mucus membranes or skin necrosis: Uknown Has patient had a PCN reaction that required hospitalization: Unknown Has patient had a PCN reaction occurring within the last 10 years: No If all of the above answers are "NO", then may proceed with Cephalosporin use.      Medication List    STOP taking these medications   lisinopril 20 MG tablet Commonly known as: ZESTRIL     TAKE these medications    aspirin 325 MG tablet Take 1 tablet (325 mg total) by mouth daily.   furosemide 40 MG tablet Commonly known as: LASIX Take 0.5 tablets (20 mg total) by mouth daily. Start taking on: November 26, 2020 What changed: These instructions start on November 26, 2020. If you are unsure what to do until then, ask your doctor or other care provider.   linagliptin 5 MG Tabs tablet Commonly known as: TRADJENTA Take 5 mg by mouth daily.   loratadine 10 MG tablet Commonly known as: CLARITIN Take 10 mg by mouth daily.   LORazepam 0.5 MG tablet Commonly known as: ATIVAN Take 0.5 mg by mouth daily as needed for anxiety.   metoprolol succinate 25 MG 24 hr tablet Commonly known as: TOPROL-XL Take 25 mg by mouth daily. What changed: Another medication with the same name was removed. Continue taking this medication, and follow the directions you see here.   rosuvastatin 20 MG tablet Commonly known as: CRESTOR Take 20 mg by mouth daily.   saccharomyces boulardii 250 MG capsule Commonly known as: FLORASTOR Take 1 capsule (250 mg total) by mouth 2 (two) times daily for 20 days.   sertraline 25 MG tablet Commonly known as: ZOLOFT Take 25 mg by mouth at bedtime.   Toujeo SoloStar 300 UNIT/ML Solostar Pen Generic drug: insulin glargine (1 Unit Dial) Inject 25 Units into the skin daily. Prime pen with 3u prior to each use What changed: how much to take   Vitamin D2 50 MCG (2000 UT) Tabs Take 1 capsule by mouth daily.      Allergies  Allergen Reactions  . Codeine Other (See Comments)    Unknown reaction  . Penicillins Other (See Comments)    Unknown reaction  Has patient had  a PCN reaction causing immediate rash, facial/tongue/throat swelling, SOB or lightheadedness with hypotension: Uknown Has patient had a PCN reaction causing severe rash involving mucus membranes or skin necrosis: Uknown Has patient had a PCN reaction that required hospitalization: Unknown Has patient had a PCN  reaction occurring within the last 10 years: No If all of the above answers are "NO", then may proceed with Cephalosporin use.    Follow-up Information    Hague, Rosalyn Charters, MD. Schedule an appointment as soon as possible for a visit in 10 day(s).   Specialty: Internal Medicine Contact information: [address] [place] [address] [phone]               The results of significant diagnostics from this hospitalization (including imaging, microbiology, ancillary and laboratory) are listed below for reference.    Significant Diagnostic Studies: No results found.  Microbiology: Recent Results (from the past 240 hour(s))  SARS Coronavirus 2 by RT PCR (hospital order, performed in [facility] hospital lab) Nasopharyngeal Nasopharyngeal Swab     Status: Abnormal   Collection Time: 11/20/20  1:41 PM   Specimen: Nasopharyngeal Swab  Result Value Ref Range Status   SARS Coronavirus 2 POSITIVE (A) NEGATIVE Final    Comment: CRITICAL RESULT CALLED TO, READ BACK BY AND VERIFIED WITH: HASTINGS,K. RN @1452  11/20/20 BILLINGSLEY,L (NOTE) SARS-CoV-2 target nucleic acids are DETECTED  SARS-CoV-2 RNA is generally detectable in upper respiratory specimens  during the acute phase of infection.  Positive results are indicative  of the presence of the identified virus, but do not rule out bacterial infection or co-infection with other pathogens not detected by the test.  Clinical correlation with patient history and  other diagnostic information is necessary to determine patient infection status.  The expected result is negative.  Fact Sheet for Patients:   StrictlyIdeas.no   Fact Sheet for Healthcare Providers:   BankingDealers.co.za    This test is not yet approved or cleared by the [place] FDA and  has been authorized for detection and/or diagnosis of SARS-CoV-2 by FDA under an Emergency Use Authorization (EUA).  This EUA  will remain in effect  (meaning this test can be used) for the duration of  the COVID-19 declaration under Section 564(b)(1) of the Act, 21 U.S.C. section 360-bbb-3(b)(1), unless the authorization is terminated or revoked sooner.  Performed at [facility], [address], [place], [place] [address]      Labs: Basic Metabolic Panel: Recent Labs  Lab 11/20/20 1138 11/21/20 0519 11/22/20 0759  NA 130* 136 139  K 4.4 4.1 4.0  CL 104 107 113*  CO2 18* 20* 21*  GLUCOSE 212* 126* 88  BUN 66* 58* 45*  CREATININE 3.05* 2.15* 1.49*  CALCIUM 8.4* 8.5* 8.4*  MG  --  1.8 1.6*   Liver Function Tests: Recent Labs  Lab 11/20/20 1138 11/21/20 0519 11/22/20 0759  AST 29 24 22   ALT 20 18 16   ALKPHOS 56 50 47  BILITOT 0.7 0.5 0.5  PROT 6.1* 5.6* 5.3*  ALBUMIN 3.4* 3.0* 2.9*   CBC: Recent Labs  Lab 11/20/20 1138 11/21/20 0519 11/22/20 0759  WBC 8.3 7.9 6.4  NEUTROABS 5.4 4.3 3.0  HGB 11.1* 10.5* 10.2*  HCT 36.4 32.5* 31.6*  MCV 94.5 89.3 89.8  PLT 99* 114* 108*   CBG: Recent Labs  Lab 11/21/20 1129 11/21/20 1633 11/21/20 2144 11/22/20 0807 11/22/20 1127  GLUCAP 94 77 84 83 107*    Signed:  Barton Dubois MD.  [facility] 11/22/2020, 1:28 PM

## 2020-11-22 NOTE — Plan of Care (Signed)
  Problem: Education: Goal: Knowledge of General Education information will improve Description: Including pain rating scale, medication(s)/side effects and non-pharmacologic comfort measures 11/22/2020 1422 by Melony Overly, RN Outcome: Adequate for Discharge 11/22/2020 [phone] by Melony Overly, RN Outcome: Progressing   Problem: Health Behavior/Discharge Planning: Goal: Ability to manage health-related needs will improve 11/22/2020 1422 by Melony Overly, RN Outcome: Adequate for Discharge 11/22/2020 [phone] by Melony Overly, RN Outcome: Progressing   Problem: Clinical Measurements: Goal: Ability to maintain clinical measurements within normal limits will improve 11/22/2020 1422 by Melony Overly, RN Outcome: Adequate for Discharge 11/22/2020 0948 by Melony Overly, RN Outcome: Progressing Goal: Will remain free from infection 11/22/2020 1422 by Melony Overly, RN Outcome: Adequate for Discharge 11/22/2020 [phone] by Melony Overly, RN Outcome: Progressing Goal: Diagnostic test results will improve 11/22/2020 1422 by Melony Overly, RN Outcome: Adequate for Discharge 11/22/2020 0948 by Melony Overly, RN Outcome: Progressing Goal: Respiratory complications will improve 11/22/2020 1422 by Melony Overly, RN Outcome: Adequate for Discharge 11/22/2020 0948 by Melony Overly, RN Outcome: Progressing Goal: Cardiovascular complication will be avoided 11/22/2020 1422 by Melony Overly, RN Outcome: Adequate for Discharge 11/22/2020 0948 by Melony Overly, RN Outcome: Progressing   Problem: Activity: Goal: Risk for activity intolerance will decrease 11/22/2020 1422 by Melony Overly, RN Outcome: Adequate for Discharge 11/22/2020 [phone] by Melony Overly, RN Outcome: Progressing   Problem: Nutrition: Goal: Adequate nutrition will be maintained 11/22/2020 1422 by Melony Overly, RN Outcome: Adequate for Discharge 11/22/2020 [phone] by Melony Overly, RN Outcome: Progressing   Problem:  Coping: Goal: Level of anxiety will decrease 11/22/2020 1422 by Melony Overly, RN Outcome: Adequate for Discharge 11/22/2020 [phone] by Melony Overly, RN Outcome: Progressing   Problem: Elimination: Goal: Will not experience complications related to bowel motility 11/22/2020 1422 by Melony Overly, RN Outcome: Adequate for Discharge 11/22/2020 [phone] by Melony Overly, RN Outcome: Progressing Goal: Will not experience complications related to urinary retention 11/22/2020 1422 by Melony Overly, RN Outcome: Adequate for Discharge 11/22/2020 [phone] by Melony Overly, RN Outcome: Progressing   Problem: Pain Managment: Goal: General experience of comfort will improve 11/22/2020 1422 by Melony Overly, RN Outcome: Adequate for Discharge 11/22/2020 0948 by Melony Overly, RN Outcome: Progressing   Problem: Safety: Goal: Ability to remain free from injury will improve 11/22/2020 1422 by Melony Overly, RN Outcome: Adequate for Discharge 11/22/2020 0948 by Melony Overly, RN Outcome: Progressing   Problem: Skin Integrity: Goal: Risk for impaired skin integrity will decrease 11/22/2020 1422 by Melony Overly, RN Outcome: Adequate for Discharge 11/22/2020 0948 by Melony Overly, RN Outcome: Progressing

## 2020-11-22 NOTE — TOC Initial Note (Signed)
Transition of Care [facility]) - Initial/Assessment Note    Patient Details  Name: Rebecca Hardy MRN: [id number] Date of Birth: Mar 29, 1933  Transition of [facility]) CM/SW Contact:    Ihor Gully, LCSW Phone Number: 11/22/2020, 12:14 PM  Clinical Narrative:                 Patient is a LTC resident at [facility]. Spoke with Reuben Likes at faciltiy who indicates that over the past month patient has experienced functional decline and is not doing anything for herself. Discussed PT recommendation. She previously ambulated with a RW. Facility is agreeable for patient to return.  Spoke with daughter, Ms. Sabra Heck, and advised of discharge today.   Expected Discharge Plan: Assisted Living Barriers to Discharge: No Barriers Identified   Patient Goals and CMS Choice Patient states their goals for this hospitalization and ongoing recovery are:: return to the facility      Expected Discharge Plan and Services Expected Discharge Plan: Assisted Living       Living arrangements for the past 2 months: [place]                                      Prior Living Arrangements/Services Living arrangements for the past 2 months: [place] Lives with:: Facility Resident   Do you feel safe going back to the place where you live?: Yes      Need for Family Participation in Patient Care: Yes (Comment) Care giver support system in place?: Yes (comment) Current home services: Home PT Criminal Activity/Legal Involvement Pertinent to Current Situation/Hospitalization: No - Comment as needed  Activities of Daily Living   ADL Screening (condition at time of admission) Patient's cognitive ability adequate to safely complete daily activities?: No Is the patient deaf or have difficulty hearing?: No Does the patient have difficulty seeing, even when wearing glasses/contacts?: No Does the patient have difficulty concentrating, remembering, or making decisions?:  Yes Patient able to express need for assistance with ADLs?: No Does the patient have difficulty dressing or bathing?: Yes Independently performs ADLs?: No  Permission Sought/Granted Permission sought to share information with : Family Supports    Share Information with NAME: [name]     Permission granted to share info w Relationship: daughter     Emotional Assessment     Affect (typically observed): Appropriate Orientation: : Oriented to Self Alcohol / Substance Use: Not Applicable Psych Involvement: No (comment)  Admission diagnosis:  AKI (acute kidney injury) ([place]) [N17.9] Acute renal failure (ARF) ([place]) [N17.9] Patient Active Problem List   Diagnosis Date Noted  . Acute renal failure (ARF) ([place]) 11/21/2020  . AKI (acute kidney injury) ([place]) 11/20/2020  . Acute ischemic cerebrovascular accident (CVA) involving right middle cerebral artery territory ([place]) 08/20/2018  . Narrow complex tachycardia ([place]) 08/20/2018  . Left-sided weakness 08/19/2018  . HTN (hypertension) 08/19/2018  . HLD (hyperlipidemia) 08/19/2018  . CKD (chronic kidney disease) stage 3, GFR 30-59 ml/min (HCC) 08/19/2018  . Type 2 diabetes mellitus with hyperlipidemia ([place]) 08/19/2018   PCP:  Bonnita Nasuti, MD Pharmacy:   [facility], [place] [address] Phone: [phone] Fax: [phone]     Social Determinants of Health (SDOH) Interventions    Readmission Risk Interventions No flowsheet data found.

## 2020-11-22 NOTE — NC FL2 (Signed)
[place] LEVEL OF CARE SCREENING TOOL     IDENTIFICATION  Patient Name: Rebecca Hardy Birthdate: [birth year] Sex: female Admission Date (Current Location): 11/20/2020  [place] and [place] Number:  [place] [facility] [id number] [place] and Address:  [facility] [address] [place]      Provider Number: [id number]  Attending Physician Name and Address:  Barton Dubois, MD  Relative Name and Phone Number:  [name] (Daughter)   [phone]    Current Level of Care: Hospital Recommended Level of Care: ALF  [facility] CARE UNIT) Prior Approval Number:    Date Approved/Denied:   PASRR Number:    Discharge Plan: ALF   [facility] CARE UNIT)    Current Diagnoses: Patient Active Problem List   Diagnosis Date Noted  . COVID-19 virus infection   . Thrombocytopenia ([place])   . Hx of transient ischemic attack (TIA)   . Acute renal failure superimposed on stage 3b chronic kidney disease ([place]) 11/21/2020  . AKI (acute kidney injury) ([place]) 11/20/2020  . Acute ischemic cerebrovascular accident (CVA) involving right middle cerebral artery territory ([place]) 08/20/2018  . Narrow complex tachycardia ([place]) 08/20/2018  . Left-sided weakness 08/19/2018  . Essential hypertension 08/19/2018  . HLD (hyperlipidemia) 08/19/2018  . CKD (chronic kidney disease) stage 3, GFR 30-59 ml/min (HCC) 08/19/2018  . Type 2 diabetes mellitus with hyperlipidemia ([place]) 08/19/2018    Orientation RESPIRATION BLADDER Height & Weight     Self    Incontinent Weight: 163 lb 2.3 oz (74 kg) Height:     BEHAVIORAL SYMPTOMS/MOOD NEUROLOGICAL BOWEL NUTRITION STATUS      Incontinent Diet (heart healthy/carb modified)  AMBULATORY STATUS COMMUNICATION OF NEEDS Skin   Extensive Assist Verbally                         Personal Care Assistance Level of Assistance  Bathing,Feeding,Dressing Bathing Assistance: Maximum assistance Feeding assistance: Maximum assistance Dressing  Assistance: Maximum assistance     Functional Limitations Info  Sight,Speech,Hearing Sight Info: Adequate Hearing Info: Adequate Speech Info: Adequate    SPECIAL CARE FACTORS FREQUENCY  PT (By licensed PT)     PT Frequency: 3x/week              Contractures Contractures Info: Not present    Additional Factors Info  Code Status,Allergies,Psychotropic,Isolation Precautions Code Status Info: Full Code Allergies Info: Codeine, Penicillins Psychotropic Info: Ativan, Zoloft   Isolation Precautions Info: 11/20/20 COVID+     Current Medications (11/22/2020):  This is the current hospital active medication list Current Facility-Administered Medications  Medication Dose Route Frequency Provider Last Rate Last Admin  . 0.9 %  sodium chloride infusion   Intravenous Continuous Heath Lark D, DO 75 mL/hr at 11/22/20 1107 New Bag at 11/22/20 1107  . acetaminophen (TYLENOL) tablet 650 mg  650 mg Oral Q6H PRN Manuella Ghazi, Pratik D, DO       Or  . acetaminophen (TYLENOL) suppository 650 mg  650 mg Rectal Q6H PRN Manuella Ghazi, Pratik D, DO      . insulin aspart (novoLOG) injection 0-5 Units  0-5 Units Subcutaneous QHS Shah, Pratik D, DO      . insulin aspart (novoLOG) injection 0-9 Units  0-9 Units Subcutaneous TID WC Shah, Pratik D, DO      . LORazepam (ATIVAN) tablet 0.5 mg  0.5 mg Oral Daily PRN Manuella Ghazi, Pratik D, DO   0.5 mg at 11/22/20 1104  . ondansetron (ZOFRAN) tablet 4 mg  4 mg Oral Q6H PRN Manuella Ghazi, Pratik D, DO       Or  . ondansetron (ZOFRAN) injection 4 mg  4 mg Intravenous Q6H PRN Manuella Ghazi, Pratik D, DO      . rosuvastatin (CRESTOR) tablet 20 mg  20 mg Oral q1800 Manuella Ghazi, Pratik D, DO   20 mg at 11/21/20 1655  . saccharomyces boulardii (FLORASTOR) capsule 250 mg  250 mg Oral BID Barton Dubois, MD   250 mg at 11/22/20 0848  . sertraline (ZOLOFT) tablet 25 mg  25 mg Oral QHS Manuella Ghazi, Pratik D, DO   25 mg at 11/21/20 2224     Discharge Medications: Medication List    STOP taking these medications    lisinopril 20 MG tablet Commonly known as: ZESTRIL     TAKE these medications   aspirin 325 MG tablet Take 1 tablet (325 mg total) by mouth daily.   furosemide 40 MG tablet Commonly known as: LASIX Take 0.5 tablets (20 mg total) by mouth daily. Start taking on: November 26, 2020 What changed: These instructions start on November 26, 2020. If you are unsure what to do until then, ask your doctor or other care provider.   linagliptin 5 MG Tabs tablet Commonly known as: TRADJENTA Take 5 mg by mouth daily.   loratadine 10 MG tablet Commonly known as: CLARITIN Take 10 mg by mouth daily.   LORazepam 0.5 MG tablet Commonly known as: ATIVAN Take 0.5 mg by mouth daily as needed for anxiety.   metoprolol succinate 25 MG 24 hr tablet Commonly known as: TOPROL-XL Take 25 mg by mouth daily. What changed: Another medication with the same name was removed. Continue taking this medication, and follow the directions you see here.   rosuvastatin 20 MG tablet Commonly known as: CRESTOR Take 20 mg by mouth daily.   saccharomyces boulardii 250 MG capsule Commonly known as: FLORASTOR Take 1 capsule (250 mg total) by mouth 2 (two) times daily for 20 days.   sertraline 25 MG tablet Commonly known as: ZOLOFT Take 25 mg by mouth at bedtime.   Toujeo SoloStar 300 UNIT/ML Solostar Pen Generic drug: insulin glargine (1 Unit Dial) Inject 25 Units into the skin daily. Prime pen with 3u prior to each use What changed: how much to take   Vitamin D2 50 MCG (2000 UT) Tabs Take 1 capsule by mouth daily.   Relevant Imaging Results:  Relevant Lab Results:   Additional Information    Santana Gosdin, Clydene Pugh, LCSW

## 2020-11-22 NOTE — Evaluation (Signed)
Physical Therapy Evaluation Patient Details Name: Rebecca Hardy MRN: [id number] DOB: Jul 03, 1933 Today's Date: 11/22/2020   History of Present Illness  Rebecca Hardy is a 87 y.o. female with medical history significant for type 2 diabetes, dyslipidemia, hypertension, and prior TIA, who was brought to the ED via EMS from her ALF on account of hypotension noted on vital signs check this a.m.  She has otherwise been in her normal state of health with no other symptoms identified.  Currently she has been eating and drinking well, but did have one loose bowel movement overnight.  She otherwise remains on her home Lasix daily as prescribed.  No complaint of chest pain, shortness of breath, abdominal pain, nausea, vomiting, fevers, or chills noted.    Clinical Impression  Patient demonstrates slow labored movement for sitting up at bedside with limited use of left side due to old CVA, unable to come to complete stand due to weakness and poor balance.  Patient required Max/total assist to reposition when put back to bed.  Patient will benefit from continued physical therapy in hospital and recommended venue below to increase strength, balance, endurance for safe ADLs and gait.     Follow Up Recommendations SNF;Supervision for mobility/OOB;Supervision - Intermittent    Equipment Recommendations  None recommended by PT    Recommendations for Other Services       Precautions / Restrictions Precautions Precautions: Fall Restrictions Weight Bearing Restrictions: No      Mobility  Bed Mobility Overal bed mobility: Needs Assistance Bed Mobility: Supine to Sit;Sit to Supine     Supine to sit: Max assist Sit to supine: Max assist   General bed mobility comments: unable to use LUE for bed mobility due to old CVA    Transfers Overall transfer level: Needs assistance Equipment used: Rolling walker (2 wheeled) Transfers: Sit to/from Stand Sit to Stand: Max assist;Total assist          General transfer comment: limited to liftig bottom off bed with Max/total assist using RUE on RW  Ambulation/Gait                Stairs            Wheelchair Mobility    Modified Rankin (Stroke Patients Only)       Balance Overall balance assessment: Needs assistance Sitting-balance support: Feet supported;No upper extremity supported Sitting balance-Leahy Scale: Fair Sitting balance - Comments: seated at EOB   Standing balance support: During functional activity;Single extremity supported Standing balance-Leahy Scale: Poor Standing balance comment: using right hand on RW                             Pertinent Vitals/Pain Pain Assessment: Faces Faces Pain Scale: Hurts even more Pain Location: BLE with pressure/movement Pain Descriptors / Indicators: Sore;Grimacing;Guarding Pain Intervention(s): Limited activity within patient's tolerance;Monitored during session;Repositioned    Home Living Family/patient expects to be discharged to:: Assisted living               Home Equipment: Walker - 2 wheels      Prior Function Level of Independence: Needs assistance   Gait / Transfers Assistance Needed: patient is poor historian  ADL's / Homemaking Assistance Needed: assisted by ALF staff        Hand Dominance   Dominant Hand: Right    Extremity/Trunk Assessment   Upper Extremity Assessment Upper Extremity Assessment: Generalized weakness;LUE deficits/detail;RUE deficits/detail RUE Deficits / Details: grossly -  4/5 RUE Sensation: WNL RUE Coordination: WNL LUE Deficits / Details: grossly 1/5 with left hand contracture LUE: Unable to fully assess due to immobilization LUE Sensation: decreased proprioception;decreased light touch LUE Coordination: decreased gross motor;decreased fine motor    Lower Extremity Assessment Lower Extremity Assessment: Generalized weakness;RLE deficits/detail;LLE deficits/detail RLE Deficits / Details:  grossly -3/5 RLE: Unable to fully assess due to pain RLE Sensation: WNL RLE Coordination: WNL LLE Deficits / Details: grossly 2/5 LLE: Unable to fully assess due to pain LLE Sensation: decreased light touch LLE Coordination: decreased fine motor;decreased gross motor    Cervical / Trunk Assessment Cervical / Trunk Assessment: Kyphotic  Communication   Communication: Other (comment) (appears confused repeating same phrase over and over)  Cognition Arousal/Alertness: Awake/alert Behavior During Therapy: WFL for tasks assessed/performed;Flat affect Overall Cognitive Status: History of cognitive impairments - at baseline                                        General Comments      Exercises     Assessment/Plan    PT Assessment Patient needs continued PT services  PT Problem List Decreased strength;Decreased range of motion;Decreased activity tolerance;Decreased balance;Decreased mobility       PT Treatment Interventions Functional mobility training;Therapeutic activities;Therapeutic exercise;Patient/family education;Balance training;DME instruction    PT Goals (Current goals can be found in the Care Plan section)  Acute Rehab PT Goals Patient Stated Goal: return home with ALF staff to assist PT Goal Formulation: With patient Time For Goal Achievement: 12/06/20 Potential to Achieve Goals: Fair    Frequency Min 2X/week   Barriers to discharge        Co-evaluation               AM-PAC PT "6 Clicks" Mobility  Outcome Measure Help needed turning from your back to your side while in a flat bed without using bedrails?: A Lot Help needed moving from lying on your back to sitting on the side of a flat bed without using bedrails?: A Lot Help needed moving to and from a bed to a chair (including a wheelchair)?: Total Help needed standing up from a chair using your arms (e.g., wheelchair or bedside chair)?: Total Help needed to walk in hospital room?:  Total Help needed climbing 3-5 steps with a railing? : Total 6 Click Score: 8    End of Session   Activity Tolerance: Patient tolerated treatment well;Patient limited by fatigue;Patient limited by pain Patient left: in bed;with call bell/phone within reach;with bed alarm set Nurse Communication: Mobility status PT Visit Diagnosis: Unsteadiness on feet (R26.81);Other abnormalities of gait and mobility (R26.89);Muscle weakness (generalized) (M62.81)    Time: 0076-2263 PT Time Calculation (min) (ACUTE ONLY): 24 min   Charges:   PT Evaluation $PT Eval Moderate Complexity: 1 Mod PT Treatments $Therapeutic Activity: 23-37 mins        11:22 AM, 11/22/20 Lonell Grandchild, MPT Physical Therapist with [facility] [phone] office [phone] mobile phone

## 2020-11-22 NOTE — Plan of Care (Signed)
  Problem: Acute Rehab PT Goals(only PT should resolve) Goal: Pt will Roll Supine to Side Outcome: Progressing Flowsheets (Taken 11/22/2020 1124) Pt will Roll Supine to Side:  with min assist  with mod assist Goal: Pt Will Go Supine/Side To Sit Outcome: Progressing Flowsheets (Taken 11/22/2020 1124) Pt will go Supine/Side to Sit:  with minimal assist  with moderate assist Goal: Pt Will Go Sit To Supine/Side Outcome: Progressing Flowsheets (Taken 11/22/2020 1124) Pt will go Sit to Supine/Side:  with minimal assist  with moderate assist Goal: Patient Will Perform Sitting Balance Outcome: Progressing Flowsheets (Taken 11/22/2020 1124) Patient will perform sitting balance: with modified independence Goal: Patient Will Transfer Sit To/From Stand Outcome: Progressing Flowsheets (Taken 11/22/2020 1124) Patient will transfer sit to/from stand: with moderate assist Goal: Pt Will Transfer Bed To Chair/Chair To Bed Outcome: Progressing Flowsheets (Taken 11/22/2020 1124) Pt will Transfer Bed to Chair/Chair to Bed:  with mod assist  with max assist   11:25 AM, 11/22/20 Lonell Grandchild, MPT Physical Therapist with [facility] [phone] office [phone] mobile phone

## 2020-11-22 NOTE — NC FL2 (Deleted)
[place] LEVEL OF CARE SCREENING TOOL     IDENTIFICATION  Patient Name: Rebecca Hardy Birthdate: July 08, 1933 Sex: female Admission Date (Current Location): 11/20/2020  [place] and [place] Number:  [facility] [id number] [place] and Address:  [facility] [address] [place]      Provider Number: [id number]  Attending Physician Name and Address:  Barton Dubois, MD  Relative Name and Phone Number:  [name] (Daughter)   [phone]    Current Level of Care: Hospital Recommended Level of Care: [place] Prior Approval Number:    Date Approved/Denied:   PASRR Number:    Discharge Plan: Domiciliary (Rest home) (ALF)    Current Diagnoses: Patient Active Problem List   Diagnosis Date Noted  . Acute renal failure (ARF) ([place]) 11/21/2020  . AKI (acute kidney injury) ([place]) 11/20/2020  . Acute ischemic cerebrovascular accident (CVA) involving right middle cerebral artery territory ([place]) 08/20/2018  . Narrow complex tachycardia ([place]) 08/20/2018  . Left-sided weakness 08/19/2018  . HTN (hypertension) 08/19/2018  . HLD (hyperlipidemia) 08/19/2018  . CKD (chronic kidney disease) stage 3, GFR 30-59 ml/min (HCC) 08/19/2018  . Type 2 diabetes mellitus with hyperlipidemia ([place]) 08/19/2018    Orientation RESPIRATION BLADDER Height & Weight     Self    Incontinent Weight: 163 lb 2.3 oz (74 kg) Height:     BEHAVIORAL SYMPTOMS/MOOD NEUROLOGICAL BOWEL NUTRITION STATUS      Incontinent Diet low sodium/heart healthy  AMBULATORY STATUS COMMUNICATION OF NEEDS Skin   Extensive Assist Verbally                         Personal Care Assistance Level of Assistance  Bathing,Feeding,Dressing Bathing Assistance: Maximum assistance Feeding assistance: Maximum assistance Dressing Assistance: Maximum assistance     Functional Limitations Info  Sight,Speech,Hearing Sight Info: Adequate Hearing Info: Adequate Speech Info:  Adequate    SPECIAL CARE FACTORS FREQUENCY  PT (By licensed PT)     PT Frequency: 3x/week              Contractures Contractures Info: Not present    Additional Factors Info  Code Status,Allergies,Psychotropic,Isolation Precautions Code Status Info: Full Code Allergies Info: Codeine, Penicillins Psychotropic Info: Ativan, Zoloft   Isolation Precautions Info: 11/20/20 COVID+     Current Medications (11/22/2020):  This is the current hospital active medication list Current Facility-Administered Medications  Medication Dose Route Frequency Provider Last Rate Last Admin  . 0.9 %  sodium chloride infusion   Intravenous Continuous Heath Lark D, DO 75 mL/hr at 11/22/20 1107 New Bag at 11/22/20 1107  . acetaminophen (TYLENOL) tablet 650 mg  650 mg Oral Q6H PRN Manuella Ghazi, Pratik D, DO       Or  . acetaminophen (TYLENOL) suppository 650 mg  650 mg Rectal Q6H PRN Manuella Ghazi, Pratik D, DO      . insulin aspart (novoLOG) injection 0-5 Units  0-5 Units Subcutaneous QHS Shah, Pratik D, DO      . insulin aspart (novoLOG) injection 0-9 Units  0-9 Units Subcutaneous TID WC Shah, Pratik D, DO      . LORazepam (ATIVAN) tablet 0.5 mg  0.5 mg Oral Daily PRN Manuella Ghazi, Pratik D, DO   0.5 mg at 11/22/20 1104  . ondansetron (ZOFRAN) tablet 4 mg  4 mg Oral Q6H PRN Manuella Ghazi, Pratik D, DO       Or  . ondansetron (ZOFRAN) injection 4 mg  4 mg Intravenous Q6H PRN  Manuella Ghazi, Pratik D, DO      . rosuvastatin (CRESTOR) tablet 20 mg  20 mg Oral q1800 Manuella Ghazi, Pratik D, DO   20 mg at 11/21/20 1655  . saccharomyces boulardii (FLORASTOR) capsule 250 mg  250 mg Oral BID Barton Dubois, MD   250 mg at 11/22/20 0848  . sertraline (ZOLOFT) tablet 25 mg  25 mg Oral QHS Manuella Ghazi, Pratik D, DO   25 mg at 11/21/20 2224     Discharge Medications: Medication List    STOP taking these medications   lisinopril 20 MG tablet Commonly known as: ZESTRIL     TAKE these medications   aspirin 325 MG tablet Take 1 tablet (325 mg total) by mouth  daily.   furosemide 40 MG tablet Commonly known as: LASIX Take 0.5 tablets (20 mg total) by mouth daily. Start taking on: November 26, 2020 What changed: These instructions start on November 26, 2020. If you are unsure what to do until then, ask your doctor or other care provider.   linagliptin 5 MG Tabs tablet Commonly known as: TRADJENTA Take 5 mg by mouth daily.   loratadine 10 MG tablet Commonly known as: CLARITIN Take 10 mg by mouth daily.   LORazepam 0.5 MG tablet Commonly known as: ATIVAN Take 0.5 mg by mouth daily as needed for anxiety.   metoprolol succinate 25 MG 24 hr tablet Commonly known as: TOPROL-XL Take 25 mg by mouth daily. What changed: Another medication with the same name was removed. Continue taking this medication, and follow the directions you see here.   rosuvastatin 20 MG tablet Commonly known as: CRESTOR Take 20 mg by mouth daily.   saccharomyces boulardii 250 MG capsule Commonly known as: FLORASTOR Take 1 capsule (250 mg total) by mouth 2 (two) times daily for 20 days.   sertraline 25 MG tablet Commonly known as: ZOLOFT Take 25 mg by mouth at bedtime.   Toujeo SoloStar 300 UNIT/ML Solostar Pen Generic drug: insulin glargine (1 Unit Dial) Inject 25 Units into the skin daily. Prime pen with 3u prior to each use What changed: how much to take   Vitamin D2 50 MCG (2000 UT) Tabs Take 1 capsule by mouth daily.     Relevant Imaging Results:  Relevant Lab Results:   Additional Information    Rosamary Boudreau, Clydene Pugh, LCSW

## 2020-11-27 DIAGNOSIS — N179 Acute kidney failure, unspecified: Secondary | ICD-10-CM | POA: Diagnosis not present

## 2020-11-27 DIAGNOSIS — I959 Hypotension, unspecified: Secondary | ICD-10-CM | POA: Diagnosis not present

## 2020-11-27 DIAGNOSIS — U071 COVID-19: Secondary | ICD-10-CM | POA: Diagnosis not present

## 2020-12-03 DIAGNOSIS — E119 Type 2 diabetes mellitus without complications: Secondary | ICD-10-CM | POA: Diagnosis not present

## 2020-12-03 DIAGNOSIS — D518 Other vitamin B12 deficiency anemias: Secondary | ICD-10-CM | POA: Diagnosis not present

## 2020-12-03 DIAGNOSIS — E7849 Other hyperlipidemia: Secondary | ICD-10-CM | POA: Diagnosis not present

## 2020-12-03 DIAGNOSIS — Z79899 Other long term (current) drug therapy: Secondary | ICD-10-CM | POA: Diagnosis not present

## 2020-12-07 DIAGNOSIS — F329 Major depressive disorder, single episode, unspecified: Secondary | ICD-10-CM | POA: Diagnosis not present

## 2020-12-07 DIAGNOSIS — I10 Essential (primary) hypertension: Secondary | ICD-10-CM | POA: Diagnosis not present

## 2020-12-07 DIAGNOSIS — F0390 Unspecified dementia without behavioral disturbance: Secondary | ICD-10-CM | POA: Diagnosis not present

## 2020-12-07 DIAGNOSIS — E119 Type 2 diabetes mellitus without complications: Secondary | ICD-10-CM | POA: Diagnosis not present

## 2020-12-07 DIAGNOSIS — E559 Vitamin D deficiency, unspecified: Secondary | ICD-10-CM | POA: Diagnosis not present

## 2020-12-07 DIAGNOSIS — D518 Other vitamin B12 deficiency anemias: Secondary | ICD-10-CM | POA: Diagnosis not present

## 2020-12-07 DIAGNOSIS — E038 Other specified hypothyroidism: Secondary | ICD-10-CM | POA: Diagnosis not present

## 2020-12-07 DIAGNOSIS — N189 Chronic kidney disease, unspecified: Secondary | ICD-10-CM | POA: Diagnosis not present

## 2020-12-10 DIAGNOSIS — N189 Chronic kidney disease, unspecified: Secondary | ICD-10-CM | POA: Diagnosis not present

## 2020-12-10 DIAGNOSIS — E1142 Type 2 diabetes mellitus with diabetic polyneuropathy: Secondary | ICD-10-CM | POA: Diagnosis not present

## 2020-12-10 DIAGNOSIS — G459 Transient cerebral ischemic attack, unspecified: Secondary | ICD-10-CM | POA: Diagnosis not present

## 2020-12-12 DIAGNOSIS — N189 Chronic kidney disease, unspecified: Secondary | ICD-10-CM | POA: Diagnosis not present

## 2020-12-12 DIAGNOSIS — E1142 Type 2 diabetes mellitus with diabetic polyneuropathy: Secondary | ICD-10-CM | POA: Diagnosis not present

## 2020-12-12 DIAGNOSIS — G459 Transient cerebral ischemic attack, unspecified: Secondary | ICD-10-CM | POA: Diagnosis not present

## 2020-12-14 DIAGNOSIS — E1142 Type 2 diabetes mellitus with diabetic polyneuropathy: Secondary | ICD-10-CM | POA: Diagnosis not present

## 2020-12-14 DIAGNOSIS — N189 Chronic kidney disease, unspecified: Secondary | ICD-10-CM | POA: Diagnosis not present

## 2020-12-14 DIAGNOSIS — G459 Transient cerebral ischemic attack, unspecified: Secondary | ICD-10-CM | POA: Diagnosis not present

## 2020-12-17 DIAGNOSIS — N189 Chronic kidney disease, unspecified: Secondary | ICD-10-CM | POA: Diagnosis not present

## 2020-12-17 DIAGNOSIS — E1142 Type 2 diabetes mellitus with diabetic polyneuropathy: Secondary | ICD-10-CM | POA: Diagnosis not present

## 2020-12-17 DIAGNOSIS — G459 Transient cerebral ischemic attack, unspecified: Secondary | ICD-10-CM | POA: Diagnosis not present

## 2020-12-19 DIAGNOSIS — H109 Unspecified conjunctivitis: Secondary | ICD-10-CM | POA: Diagnosis not present

## 2020-12-19 DIAGNOSIS — F0390 Unspecified dementia without behavioral disturbance: Secondary | ICD-10-CM | POA: Diagnosis not present

## 2020-12-28 DIAGNOSIS — N189 Chronic kidney disease, unspecified: Secondary | ICD-10-CM | POA: Diagnosis not present

## 2020-12-28 DIAGNOSIS — Z20828 Contact with and (suspected) exposure to other viral communicable diseases: Secondary | ICD-10-CM | POA: Diagnosis not present

## 2020-12-28 DIAGNOSIS — M79674 Pain in right toe(s): Secondary | ICD-10-CM | POA: Diagnosis not present

## 2020-12-28 DIAGNOSIS — U071 COVID-19: Secondary | ICD-10-CM | POA: Diagnosis not present

## 2020-12-28 DIAGNOSIS — E119 Type 2 diabetes mellitus without complications: Secondary | ICD-10-CM | POA: Diagnosis not present

## 2020-12-28 DIAGNOSIS — M79675 Pain in left toe(s): Secondary | ICD-10-CM | POA: Diagnosis not present

## 2021-01-02 DIAGNOSIS — F0390 Unspecified dementia without behavioral disturbance: Secondary | ICD-10-CM | POA: Diagnosis not present

## 2021-01-02 DIAGNOSIS — G459 Transient cerebral ischemic attack, unspecified: Secondary | ICD-10-CM | POA: Diagnosis not present

## 2021-01-02 DIAGNOSIS — I10 Essential (primary) hypertension: Secondary | ICD-10-CM | POA: Diagnosis not present

## 2021-01-02 DIAGNOSIS — J302 Other seasonal allergic rhinitis: Secondary | ICD-10-CM | POA: Diagnosis not present

## 2021-01-02 DIAGNOSIS — F329 Major depressive disorder, single episode, unspecified: Secondary | ICD-10-CM | POA: Diagnosis not present

## 2021-01-02 DIAGNOSIS — F419 Anxiety disorder, unspecified: Secondary | ICD-10-CM | POA: Diagnosis not present

## 2021-01-02 DIAGNOSIS — N189 Chronic kidney disease, unspecified: Secondary | ICD-10-CM | POA: Diagnosis not present

## 2021-01-02 DIAGNOSIS — E1142 Type 2 diabetes mellitus with diabetic polyneuropathy: Secondary | ICD-10-CM | POA: Diagnosis not present

## 2021-01-02 DIAGNOSIS — E119 Type 2 diabetes mellitus without complications: Secondary | ICD-10-CM | POA: Diagnosis not present

## 2021-01-04 DIAGNOSIS — N189 Chronic kidney disease, unspecified: Secondary | ICD-10-CM | POA: Diagnosis not present

## 2021-01-04 DIAGNOSIS — E1142 Type 2 diabetes mellitus with diabetic polyneuropathy: Secondary | ICD-10-CM | POA: Diagnosis not present

## 2021-01-04 DIAGNOSIS — G459 Transient cerebral ischemic attack, unspecified: Secondary | ICD-10-CM | POA: Diagnosis not present

## 2021-01-07 DIAGNOSIS — E1142 Type 2 diabetes mellitus with diabetic polyneuropathy: Secondary | ICD-10-CM | POA: Diagnosis not present

## 2021-01-07 DIAGNOSIS — G459 Transient cerebral ischemic attack, unspecified: Secondary | ICD-10-CM | POA: Diagnosis not present

## 2021-01-07 DIAGNOSIS — N189 Chronic kidney disease, unspecified: Secondary | ICD-10-CM | POA: Diagnosis not present

## 2021-01-09 DIAGNOSIS — G459 Transient cerebral ischemic attack, unspecified: Secondary | ICD-10-CM | POA: Diagnosis not present

## 2021-01-09 DIAGNOSIS — N189 Chronic kidney disease, unspecified: Secondary | ICD-10-CM | POA: Diagnosis not present

## 2021-01-09 DIAGNOSIS — E1142 Type 2 diabetes mellitus with diabetic polyneuropathy: Secondary | ICD-10-CM | POA: Diagnosis not present

## 2021-01-10 DIAGNOSIS — D518 Other vitamin B12 deficiency anemias: Secondary | ICD-10-CM | POA: Diagnosis not present

## 2021-01-10 DIAGNOSIS — E559 Vitamin D deficiency, unspecified: Secondary | ICD-10-CM | POA: Diagnosis not present

## 2021-01-10 DIAGNOSIS — E038 Other specified hypothyroidism: Secondary | ICD-10-CM | POA: Diagnosis not present

## 2021-01-10 DIAGNOSIS — F0390 Unspecified dementia without behavioral disturbance: Secondary | ICD-10-CM | POA: Diagnosis not present

## 2021-01-10 DIAGNOSIS — N189 Chronic kidney disease, unspecified: Secondary | ICD-10-CM | POA: Diagnosis not present

## 2021-01-10 DIAGNOSIS — F329 Major depressive disorder, single episode, unspecified: Secondary | ICD-10-CM | POA: Diagnosis not present

## 2021-01-10 DIAGNOSIS — E119 Type 2 diabetes mellitus without complications: Secondary | ICD-10-CM | POA: Diagnosis not present

## 2021-01-10 DIAGNOSIS — I10 Essential (primary) hypertension: Secondary | ICD-10-CM | POA: Diagnosis not present

## 2021-01-11 DIAGNOSIS — G459 Transient cerebral ischemic attack, unspecified: Secondary | ICD-10-CM | POA: Diagnosis not present

## 2021-01-11 DIAGNOSIS — N189 Chronic kidney disease, unspecified: Secondary | ICD-10-CM | POA: Diagnosis not present

## 2021-01-11 DIAGNOSIS — E1142 Type 2 diabetes mellitus with diabetic polyneuropathy: Secondary | ICD-10-CM | POA: Diagnosis not present

## 2021-01-14 DIAGNOSIS — E1142 Type 2 diabetes mellitus with diabetic polyneuropathy: Secondary | ICD-10-CM | POA: Diagnosis not present

## 2021-01-14 DIAGNOSIS — G459 Transient cerebral ischemic attack, unspecified: Secondary | ICD-10-CM | POA: Diagnosis not present

## 2021-01-14 DIAGNOSIS — N189 Chronic kidney disease, unspecified: Secondary | ICD-10-CM | POA: Diagnosis not present

## 2021-01-16 DIAGNOSIS — G459 Transient cerebral ischemic attack, unspecified: Secondary | ICD-10-CM | POA: Diagnosis not present

## 2021-01-16 DIAGNOSIS — N189 Chronic kidney disease, unspecified: Secondary | ICD-10-CM | POA: Diagnosis not present

## 2021-01-16 DIAGNOSIS — E1142 Type 2 diabetes mellitus with diabetic polyneuropathy: Secondary | ICD-10-CM | POA: Diagnosis not present

## 2021-01-18 DIAGNOSIS — E1142 Type 2 diabetes mellitus with diabetic polyneuropathy: Secondary | ICD-10-CM | POA: Diagnosis not present

## 2021-01-18 DIAGNOSIS — G459 Transient cerebral ischemic attack, unspecified: Secondary | ICD-10-CM | POA: Diagnosis not present

## 2021-01-18 DIAGNOSIS — N189 Chronic kidney disease, unspecified: Secondary | ICD-10-CM | POA: Diagnosis not present

## 2021-01-23 DIAGNOSIS — N189 Chronic kidney disease, unspecified: Secondary | ICD-10-CM | POA: Diagnosis not present

## 2021-01-23 DIAGNOSIS — G459 Transient cerebral ischemic attack, unspecified: Secondary | ICD-10-CM | POA: Diagnosis not present

## 2021-01-23 DIAGNOSIS — E1142 Type 2 diabetes mellitus with diabetic polyneuropathy: Secondary | ICD-10-CM | POA: Diagnosis not present

## 2021-01-25 DIAGNOSIS — G459 Transient cerebral ischemic attack, unspecified: Secondary | ICD-10-CM | POA: Diagnosis not present

## 2021-01-25 DIAGNOSIS — N189 Chronic kidney disease, unspecified: Secondary | ICD-10-CM | POA: Diagnosis not present

## 2021-01-25 DIAGNOSIS — E1142 Type 2 diabetes mellitus with diabetic polyneuropathy: Secondary | ICD-10-CM | POA: Diagnosis not present

## 2021-01-28 DIAGNOSIS — E1142 Type 2 diabetes mellitus with diabetic polyneuropathy: Secondary | ICD-10-CM | POA: Diagnosis not present

## 2021-01-28 DIAGNOSIS — N189 Chronic kidney disease, unspecified: Secondary | ICD-10-CM | POA: Diagnosis not present

## 2021-01-28 DIAGNOSIS — G459 Transient cerebral ischemic attack, unspecified: Secondary | ICD-10-CM | POA: Diagnosis not present

## 2021-01-30 DIAGNOSIS — F0390 Unspecified dementia without behavioral disturbance: Secondary | ICD-10-CM | POA: Diagnosis not present

## 2021-01-30 DIAGNOSIS — F419 Anxiety disorder, unspecified: Secondary | ICD-10-CM | POA: Diagnosis not present

## 2021-01-30 DIAGNOSIS — G459 Transient cerebral ischemic attack, unspecified: Secondary | ICD-10-CM | POA: Diagnosis not present

## 2021-01-30 DIAGNOSIS — I10 Essential (primary) hypertension: Secondary | ICD-10-CM | POA: Diagnosis not present

## 2021-01-30 DIAGNOSIS — J302 Other seasonal allergic rhinitis: Secondary | ICD-10-CM | POA: Diagnosis not present

## 2021-01-30 DIAGNOSIS — E119 Type 2 diabetes mellitus without complications: Secondary | ICD-10-CM | POA: Diagnosis not present

## 2021-01-30 DIAGNOSIS — E1142 Type 2 diabetes mellitus with diabetic polyneuropathy: Secondary | ICD-10-CM | POA: Diagnosis not present

## 2021-01-30 DIAGNOSIS — N189 Chronic kidney disease, unspecified: Secondary | ICD-10-CM | POA: Diagnosis not present

## 2021-01-30 DIAGNOSIS — F329 Major depressive disorder, single episode, unspecified: Secondary | ICD-10-CM | POA: Diagnosis not present

## 2021-02-01 DIAGNOSIS — G459 Transient cerebral ischemic attack, unspecified: Secondary | ICD-10-CM | POA: Diagnosis not present

## 2021-02-01 DIAGNOSIS — N189 Chronic kidney disease, unspecified: Secondary | ICD-10-CM | POA: Diagnosis not present

## 2021-02-01 DIAGNOSIS — E1142 Type 2 diabetes mellitus with diabetic polyneuropathy: Secondary | ICD-10-CM | POA: Diagnosis not present

## 2021-02-12 DIAGNOSIS — E559 Vitamin D deficiency, unspecified: Secondary | ICD-10-CM | POA: Diagnosis not present

## 2021-02-12 DIAGNOSIS — D518 Other vitamin B12 deficiency anemias: Secondary | ICD-10-CM | POA: Diagnosis not present

## 2021-02-12 DIAGNOSIS — N189 Chronic kidney disease, unspecified: Secondary | ICD-10-CM | POA: Diagnosis not present

## 2021-02-12 DIAGNOSIS — F329 Major depressive disorder, single episode, unspecified: Secondary | ICD-10-CM | POA: Diagnosis not present

## 2021-02-12 DIAGNOSIS — E119 Type 2 diabetes mellitus without complications: Secondary | ICD-10-CM | POA: Diagnosis not present

## 2021-02-12 DIAGNOSIS — I10 Essential (primary) hypertension: Secondary | ICD-10-CM | POA: Diagnosis not present

## 2021-02-12 DIAGNOSIS — F0390 Unspecified dementia without behavioral disturbance: Secondary | ICD-10-CM | POA: Diagnosis not present

## 2021-02-12 DIAGNOSIS — E038 Other specified hypothyroidism: Secondary | ICD-10-CM | POA: Diagnosis not present

## 2021-02-14 DIAGNOSIS — E11649 Type 2 diabetes mellitus with hypoglycemia without coma: Secondary | ICD-10-CM | POA: Diagnosis not present

## 2021-02-14 DIAGNOSIS — Z7984 Long term (current) use of oral hypoglycemic drugs: Secondary | ICD-10-CM | POA: Diagnosis not present

## 2021-02-14 DIAGNOSIS — L97519 Non-pressure chronic ulcer of other part of right foot with unspecified severity: Secondary | ICD-10-CM | POA: Diagnosis not present

## 2021-02-14 DIAGNOSIS — Z794 Long term (current) use of insulin: Secondary | ICD-10-CM | POA: Diagnosis not present

## 2021-02-14 DIAGNOSIS — E11621 Type 2 diabetes mellitus with foot ulcer: Secondary | ICD-10-CM | POA: Diagnosis not present

## 2021-02-14 DIAGNOSIS — I129 Hypertensive chronic kidney disease with stage 1 through stage 4 chronic kidney disease, or unspecified chronic kidney disease: Secondary | ICD-10-CM | POA: Diagnosis not present

## 2021-02-14 DIAGNOSIS — F419 Anxiety disorder, unspecified: Secondary | ICD-10-CM | POA: Diagnosis not present

## 2021-02-14 DIAGNOSIS — F32A Depression, unspecified: Secondary | ICD-10-CM | POA: Diagnosis not present

## 2021-02-14 DIAGNOSIS — E1122 Type 2 diabetes mellitus with diabetic chronic kidney disease: Secondary | ICD-10-CM | POA: Diagnosis not present

## 2021-02-14 DIAGNOSIS — F0390 Unspecified dementia without behavioral disturbance: Secondary | ICD-10-CM | POA: Diagnosis not present

## 2021-02-14 DIAGNOSIS — N189 Chronic kidney disease, unspecified: Secondary | ICD-10-CM | POA: Diagnosis not present

## 2021-02-14 DIAGNOSIS — Z8673 Personal history of transient ischemic attack (TIA), and cerebral infarction without residual deficits: Secondary | ICD-10-CM | POA: Diagnosis not present

## 2021-02-14 DIAGNOSIS — Z87891 Personal history of nicotine dependence: Secondary | ICD-10-CM | POA: Diagnosis not present

## 2021-02-19 DIAGNOSIS — N189 Chronic kidney disease, unspecified: Secondary | ICD-10-CM | POA: Diagnosis not present

## 2021-02-19 DIAGNOSIS — E1122 Type 2 diabetes mellitus with diabetic chronic kidney disease: Secondary | ICD-10-CM | POA: Diagnosis not present

## 2021-02-19 DIAGNOSIS — I129 Hypertensive chronic kidney disease with stage 1 through stage 4 chronic kidney disease, or unspecified chronic kidney disease: Secondary | ICD-10-CM | POA: Diagnosis not present

## 2021-02-19 DIAGNOSIS — E11621 Type 2 diabetes mellitus with foot ulcer: Secondary | ICD-10-CM | POA: Diagnosis not present

## 2021-02-19 DIAGNOSIS — F0390 Unspecified dementia without behavioral disturbance: Secondary | ICD-10-CM | POA: Diagnosis not present

## 2021-02-19 DIAGNOSIS — L97519 Non-pressure chronic ulcer of other part of right foot with unspecified severity: Secondary | ICD-10-CM | POA: Diagnosis not present

## 2021-02-21 DIAGNOSIS — I129 Hypertensive chronic kidney disease with stage 1 through stage 4 chronic kidney disease, or unspecified chronic kidney disease: Secondary | ICD-10-CM | POA: Diagnosis not present

## 2021-02-21 DIAGNOSIS — L97519 Non-pressure chronic ulcer of other part of right foot with unspecified severity: Secondary | ICD-10-CM | POA: Diagnosis not present

## 2021-02-21 DIAGNOSIS — F0390 Unspecified dementia without behavioral disturbance: Secondary | ICD-10-CM | POA: Diagnosis not present

## 2021-02-21 DIAGNOSIS — E11621 Type 2 diabetes mellitus with foot ulcer: Secondary | ICD-10-CM | POA: Diagnosis not present

## 2021-02-21 DIAGNOSIS — N189 Chronic kidney disease, unspecified: Secondary | ICD-10-CM | POA: Diagnosis not present

## 2021-02-21 DIAGNOSIS — E1122 Type 2 diabetes mellitus with diabetic chronic kidney disease: Secondary | ICD-10-CM | POA: Diagnosis not present

## 2021-02-25 DIAGNOSIS — L97519 Non-pressure chronic ulcer of other part of right foot with unspecified severity: Secondary | ICD-10-CM | POA: Diagnosis not present

## 2021-02-25 DIAGNOSIS — E1122 Type 2 diabetes mellitus with diabetic chronic kidney disease: Secondary | ICD-10-CM | POA: Diagnosis not present

## 2021-02-25 DIAGNOSIS — N189 Chronic kidney disease, unspecified: Secondary | ICD-10-CM | POA: Diagnosis not present

## 2021-02-25 DIAGNOSIS — I129 Hypertensive chronic kidney disease with stage 1 through stage 4 chronic kidney disease, or unspecified chronic kidney disease: Secondary | ICD-10-CM | POA: Diagnosis not present

## 2021-02-25 DIAGNOSIS — E11621 Type 2 diabetes mellitus with foot ulcer: Secondary | ICD-10-CM | POA: Diagnosis not present

## 2021-02-25 DIAGNOSIS — F0390 Unspecified dementia without behavioral disturbance: Secondary | ICD-10-CM | POA: Diagnosis not present

## 2021-02-27 DIAGNOSIS — E119 Type 2 diabetes mellitus without complications: Secondary | ICD-10-CM | POA: Diagnosis not present

## 2021-02-27 DIAGNOSIS — N189 Chronic kidney disease, unspecified: Secondary | ICD-10-CM | POA: Diagnosis not present

## 2021-02-27 DIAGNOSIS — M24542 Contracture, left hand: Secondary | ICD-10-CM | POA: Diagnosis not present

## 2021-02-27 DIAGNOSIS — R630 Anorexia: Secondary | ICD-10-CM | POA: Diagnosis not present

## 2021-02-27 DIAGNOSIS — J302 Other seasonal allergic rhinitis: Secondary | ICD-10-CM | POA: Diagnosis not present

## 2021-02-27 DIAGNOSIS — I10 Essential (primary) hypertension: Secondary | ICD-10-CM | POA: Diagnosis not present

## 2021-02-28 DIAGNOSIS — N189 Chronic kidney disease, unspecified: Secondary | ICD-10-CM | POA: Diagnosis not present

## 2021-02-28 DIAGNOSIS — L97519 Non-pressure chronic ulcer of other part of right foot with unspecified severity: Secondary | ICD-10-CM | POA: Diagnosis not present

## 2021-02-28 DIAGNOSIS — E1122 Type 2 diabetes mellitus with diabetic chronic kidney disease: Secondary | ICD-10-CM | POA: Diagnosis not present

## 2021-02-28 DIAGNOSIS — F0390 Unspecified dementia without behavioral disturbance: Secondary | ICD-10-CM | POA: Diagnosis not present

## 2021-02-28 DIAGNOSIS — I129 Hypertensive chronic kidney disease with stage 1 through stage 4 chronic kidney disease, or unspecified chronic kidney disease: Secondary | ICD-10-CM | POA: Diagnosis not present

## 2021-02-28 DIAGNOSIS — E11621 Type 2 diabetes mellitus with foot ulcer: Secondary | ICD-10-CM | POA: Diagnosis not present

## 2021-03-04 DIAGNOSIS — I129 Hypertensive chronic kidney disease with stage 1 through stage 4 chronic kidney disease, or unspecified chronic kidney disease: Secondary | ICD-10-CM | POA: Diagnosis not present

## 2021-03-04 DIAGNOSIS — E11621 Type 2 diabetes mellitus with foot ulcer: Secondary | ICD-10-CM | POA: Diagnosis not present

## 2021-03-04 DIAGNOSIS — E1122 Type 2 diabetes mellitus with diabetic chronic kidney disease: Secondary | ICD-10-CM | POA: Diagnosis not present

## 2021-03-04 DIAGNOSIS — N189 Chronic kidney disease, unspecified: Secondary | ICD-10-CM | POA: Diagnosis not present

## 2021-03-04 DIAGNOSIS — F0390 Unspecified dementia without behavioral disturbance: Secondary | ICD-10-CM | POA: Diagnosis not present

## 2021-03-04 DIAGNOSIS — L97519 Non-pressure chronic ulcer of other part of right foot with unspecified severity: Secondary | ICD-10-CM | POA: Diagnosis not present

## 2021-03-07 DIAGNOSIS — F329 Major depressive disorder, single episode, unspecified: Secondary | ICD-10-CM | POA: Diagnosis not present

## 2021-03-07 DIAGNOSIS — E119 Type 2 diabetes mellitus without complications: Secondary | ICD-10-CM | POA: Diagnosis not present

## 2021-03-07 DIAGNOSIS — I10 Essential (primary) hypertension: Secondary | ICD-10-CM | POA: Diagnosis not present

## 2021-03-07 DIAGNOSIS — L97519 Non-pressure chronic ulcer of other part of right foot with unspecified severity: Secondary | ICD-10-CM | POA: Diagnosis not present

## 2021-03-07 DIAGNOSIS — D518 Other vitamin B12 deficiency anemias: Secondary | ICD-10-CM | POA: Diagnosis not present

## 2021-03-07 DIAGNOSIS — N189 Chronic kidney disease, unspecified: Secondary | ICD-10-CM | POA: Diagnosis not present

## 2021-03-07 DIAGNOSIS — I129 Hypertensive chronic kidney disease with stage 1 through stage 4 chronic kidney disease, or unspecified chronic kidney disease: Secondary | ICD-10-CM | POA: Diagnosis not present

## 2021-03-07 DIAGNOSIS — E11621 Type 2 diabetes mellitus with foot ulcer: Secondary | ICD-10-CM | POA: Diagnosis not present

## 2021-03-07 DIAGNOSIS — F0390 Unspecified dementia without behavioral disturbance: Secondary | ICD-10-CM | POA: Diagnosis not present

## 2021-03-07 DIAGNOSIS — E038 Other specified hypothyroidism: Secondary | ICD-10-CM | POA: Diagnosis not present

## 2021-03-07 DIAGNOSIS — E1122 Type 2 diabetes mellitus with diabetic chronic kidney disease: Secondary | ICD-10-CM | POA: Diagnosis not present

## 2021-03-07 DIAGNOSIS — E559 Vitamin D deficiency, unspecified: Secondary | ICD-10-CM | POA: Diagnosis not present

## 2021-03-12 DIAGNOSIS — E11621 Type 2 diabetes mellitus with foot ulcer: Secondary | ICD-10-CM | POA: Diagnosis not present

## 2021-03-12 DIAGNOSIS — I129 Hypertensive chronic kidney disease with stage 1 through stage 4 chronic kidney disease, or unspecified chronic kidney disease: Secondary | ICD-10-CM | POA: Diagnosis not present

## 2021-03-12 DIAGNOSIS — F0390 Unspecified dementia without behavioral disturbance: Secondary | ICD-10-CM | POA: Diagnosis not present

## 2021-03-12 DIAGNOSIS — L97519 Non-pressure chronic ulcer of other part of right foot with unspecified severity: Secondary | ICD-10-CM | POA: Diagnosis not present

## 2021-03-12 DIAGNOSIS — N189 Chronic kidney disease, unspecified: Secondary | ICD-10-CM | POA: Diagnosis not present

## 2021-03-12 DIAGNOSIS — E1122 Type 2 diabetes mellitus with diabetic chronic kidney disease: Secondary | ICD-10-CM | POA: Diagnosis not present

## 2021-03-14 DIAGNOSIS — I129 Hypertensive chronic kidney disease with stage 1 through stage 4 chronic kidney disease, or unspecified chronic kidney disease: Secondary | ICD-10-CM | POA: Diagnosis not present

## 2021-03-14 DIAGNOSIS — N189 Chronic kidney disease, unspecified: Secondary | ICD-10-CM | POA: Diagnosis not present

## 2021-03-14 DIAGNOSIS — L97519 Non-pressure chronic ulcer of other part of right foot with unspecified severity: Secondary | ICD-10-CM | POA: Diagnosis not present

## 2021-03-14 DIAGNOSIS — E11621 Type 2 diabetes mellitus with foot ulcer: Secondary | ICD-10-CM | POA: Diagnosis not present

## 2021-03-14 DIAGNOSIS — F0390 Unspecified dementia without behavioral disturbance: Secondary | ICD-10-CM | POA: Diagnosis not present

## 2021-03-14 DIAGNOSIS — E1122 Type 2 diabetes mellitus with diabetic chronic kidney disease: Secondary | ICD-10-CM | POA: Diagnosis not present

## 2021-03-14 DIAGNOSIS — E11649 Type 2 diabetes mellitus with hypoglycemia without coma: Secondary | ICD-10-CM | POA: Diagnosis not present

## 2021-03-16 DIAGNOSIS — F419 Anxiety disorder, unspecified: Secondary | ICD-10-CM | POA: Diagnosis not present

## 2021-03-16 DIAGNOSIS — E1122 Type 2 diabetes mellitus with diabetic chronic kidney disease: Secondary | ICD-10-CM | POA: Diagnosis not present

## 2021-03-16 DIAGNOSIS — Z8673 Personal history of transient ischemic attack (TIA), and cerebral infarction without residual deficits: Secondary | ICD-10-CM | POA: Diagnosis not present

## 2021-03-16 DIAGNOSIS — I129 Hypertensive chronic kidney disease with stage 1 through stage 4 chronic kidney disease, or unspecified chronic kidney disease: Secondary | ICD-10-CM | POA: Diagnosis not present

## 2021-03-16 DIAGNOSIS — F0390 Unspecified dementia without behavioral disturbance: Secondary | ICD-10-CM | POA: Diagnosis not present

## 2021-03-16 DIAGNOSIS — E11621 Type 2 diabetes mellitus with foot ulcer: Secondary | ICD-10-CM | POA: Diagnosis not present

## 2021-03-16 DIAGNOSIS — F32A Depression, unspecified: Secondary | ICD-10-CM | POA: Diagnosis not present

## 2021-03-16 DIAGNOSIS — Z87891 Personal history of nicotine dependence: Secondary | ICD-10-CM | POA: Diagnosis not present

## 2021-03-16 DIAGNOSIS — L97519 Non-pressure chronic ulcer of other part of right foot with unspecified severity: Secondary | ICD-10-CM | POA: Diagnosis not present

## 2021-03-16 DIAGNOSIS — N189 Chronic kidney disease, unspecified: Secondary | ICD-10-CM | POA: Diagnosis not present

## 2021-03-16 DIAGNOSIS — Z794 Long term (current) use of insulin: Secondary | ICD-10-CM | POA: Diagnosis not present

## 2021-03-16 DIAGNOSIS — E11649 Type 2 diabetes mellitus with hypoglycemia without coma: Secondary | ICD-10-CM | POA: Diagnosis not present

## 2021-03-16 DIAGNOSIS — Z7984 Long term (current) use of oral hypoglycemic drugs: Secondary | ICD-10-CM | POA: Diagnosis not present

## 2021-03-19 DIAGNOSIS — L97519 Non-pressure chronic ulcer of other part of right foot with unspecified severity: Secondary | ICD-10-CM | POA: Diagnosis not present

## 2021-03-19 DIAGNOSIS — F0390 Unspecified dementia without behavioral disturbance: Secondary | ICD-10-CM | POA: Diagnosis not present

## 2021-03-19 DIAGNOSIS — N189 Chronic kidney disease, unspecified: Secondary | ICD-10-CM | POA: Diagnosis not present

## 2021-03-19 DIAGNOSIS — E11621 Type 2 diabetes mellitus with foot ulcer: Secondary | ICD-10-CM | POA: Diagnosis not present

## 2021-03-19 DIAGNOSIS — E1122 Type 2 diabetes mellitus with diabetic chronic kidney disease: Secondary | ICD-10-CM | POA: Diagnosis not present

## 2021-03-19 DIAGNOSIS — I129 Hypertensive chronic kidney disease with stage 1 through stage 4 chronic kidney disease, or unspecified chronic kidney disease: Secondary | ICD-10-CM | POA: Diagnosis not present

## 2021-03-21 DIAGNOSIS — F0390 Unspecified dementia without behavioral disturbance: Secondary | ICD-10-CM | POA: Diagnosis not present

## 2021-03-21 DIAGNOSIS — N189 Chronic kidney disease, unspecified: Secondary | ICD-10-CM | POA: Diagnosis not present

## 2021-03-21 DIAGNOSIS — E1122 Type 2 diabetes mellitus with diabetic chronic kidney disease: Secondary | ICD-10-CM | POA: Diagnosis not present

## 2021-03-21 DIAGNOSIS — L97519 Non-pressure chronic ulcer of other part of right foot with unspecified severity: Secondary | ICD-10-CM | POA: Diagnosis not present

## 2021-03-21 DIAGNOSIS — E11621 Type 2 diabetes mellitus with foot ulcer: Secondary | ICD-10-CM | POA: Diagnosis not present

## 2021-03-21 DIAGNOSIS — I129 Hypertensive chronic kidney disease with stage 1 through stage 4 chronic kidney disease, or unspecified chronic kidney disease: Secondary | ICD-10-CM | POA: Diagnosis not present

## 2021-03-25 DIAGNOSIS — E11621 Type 2 diabetes mellitus with foot ulcer: Secondary | ICD-10-CM | POA: Diagnosis not present

## 2021-03-25 DIAGNOSIS — L97519 Non-pressure chronic ulcer of other part of right foot with unspecified severity: Secondary | ICD-10-CM | POA: Diagnosis not present

## 2021-03-25 DIAGNOSIS — F0390 Unspecified dementia without behavioral disturbance: Secondary | ICD-10-CM | POA: Diagnosis not present

## 2021-03-25 DIAGNOSIS — N189 Chronic kidney disease, unspecified: Secondary | ICD-10-CM | POA: Diagnosis not present

## 2021-03-25 DIAGNOSIS — E1122 Type 2 diabetes mellitus with diabetic chronic kidney disease: Secondary | ICD-10-CM | POA: Diagnosis not present

## 2021-03-25 DIAGNOSIS — I129 Hypertensive chronic kidney disease with stage 1 through stage 4 chronic kidney disease, or unspecified chronic kidney disease: Secondary | ICD-10-CM | POA: Diagnosis not present

## 2021-03-27 DIAGNOSIS — I96 Gangrene, not elsewhere classified: Secondary | ICD-10-CM | POA: Diagnosis not present

## 2021-03-27 DIAGNOSIS — F0390 Unspecified dementia without behavioral disturbance: Secondary | ICD-10-CM | POA: Diagnosis not present

## 2021-03-27 DIAGNOSIS — E119 Type 2 diabetes mellitus without complications: Secondary | ICD-10-CM | POA: Diagnosis not present

## 2021-03-27 DIAGNOSIS — J302 Other seasonal allergic rhinitis: Secondary | ICD-10-CM | POA: Diagnosis not present

## 2021-03-27 DIAGNOSIS — N189 Chronic kidney disease, unspecified: Secondary | ICD-10-CM | POA: Diagnosis not present

## 2021-03-28 DIAGNOSIS — I129 Hypertensive chronic kidney disease with stage 1 through stage 4 chronic kidney disease, or unspecified chronic kidney disease: Secondary | ICD-10-CM | POA: Diagnosis not present

## 2021-03-28 DIAGNOSIS — E559 Vitamin D deficiency, unspecified: Secondary | ICD-10-CM | POA: Diagnosis not present

## 2021-03-28 DIAGNOSIS — D518 Other vitamin B12 deficiency anemias: Secondary | ICD-10-CM | POA: Diagnosis not present

## 2021-03-28 DIAGNOSIS — E1122 Type 2 diabetes mellitus with diabetic chronic kidney disease: Secondary | ICD-10-CM | POA: Diagnosis not present

## 2021-03-28 DIAGNOSIS — F329 Major depressive disorder, single episode, unspecified: Secondary | ICD-10-CM | POA: Diagnosis not present

## 2021-03-28 DIAGNOSIS — L97519 Non-pressure chronic ulcer of other part of right foot with unspecified severity: Secondary | ICD-10-CM | POA: Diagnosis not present

## 2021-03-28 DIAGNOSIS — F0390 Unspecified dementia without behavioral disturbance: Secondary | ICD-10-CM | POA: Diagnosis not present

## 2021-03-28 DIAGNOSIS — I10 Essential (primary) hypertension: Secondary | ICD-10-CM | POA: Diagnosis not present

## 2021-03-28 DIAGNOSIS — E038 Other specified hypothyroidism: Secondary | ICD-10-CM | POA: Diagnosis not present

## 2021-03-28 DIAGNOSIS — N189 Chronic kidney disease, unspecified: Secondary | ICD-10-CM | POA: Diagnosis not present

## 2021-03-28 DIAGNOSIS — E119 Type 2 diabetes mellitus without complications: Secondary | ICD-10-CM | POA: Diagnosis not present

## 2021-03-28 DIAGNOSIS — E11621 Type 2 diabetes mellitus with foot ulcer: Secondary | ICD-10-CM | POA: Diagnosis not present

## 2021-04-02 DIAGNOSIS — N189 Chronic kidney disease, unspecified: Secondary | ICD-10-CM | POA: Diagnosis not present

## 2021-04-02 DIAGNOSIS — I129 Hypertensive chronic kidney disease with stage 1 through stage 4 chronic kidney disease, or unspecified chronic kidney disease: Secondary | ICD-10-CM | POA: Diagnosis not present

## 2021-04-02 DIAGNOSIS — E1122 Type 2 diabetes mellitus with diabetic chronic kidney disease: Secondary | ICD-10-CM | POA: Diagnosis not present

## 2021-04-02 DIAGNOSIS — L97519 Non-pressure chronic ulcer of other part of right foot with unspecified severity: Secondary | ICD-10-CM | POA: Diagnosis not present

## 2021-04-02 DIAGNOSIS — F0390 Unspecified dementia without behavioral disturbance: Secondary | ICD-10-CM | POA: Diagnosis not present

## 2021-04-02 DIAGNOSIS — E11621 Type 2 diabetes mellitus with foot ulcer: Secondary | ICD-10-CM | POA: Diagnosis not present

## 2021-04-05 DIAGNOSIS — L97519 Non-pressure chronic ulcer of other part of right foot with unspecified severity: Secondary | ICD-10-CM | POA: Diagnosis not present

## 2021-04-05 DIAGNOSIS — I129 Hypertensive chronic kidney disease with stage 1 through stage 4 chronic kidney disease, or unspecified chronic kidney disease: Secondary | ICD-10-CM | POA: Diagnosis not present

## 2021-04-05 DIAGNOSIS — E11621 Type 2 diabetes mellitus with foot ulcer: Secondary | ICD-10-CM | POA: Diagnosis not present

## 2021-04-05 DIAGNOSIS — N189 Chronic kidney disease, unspecified: Secondary | ICD-10-CM | POA: Diagnosis not present

## 2021-04-05 DIAGNOSIS — E1122 Type 2 diabetes mellitus with diabetic chronic kidney disease: Secondary | ICD-10-CM | POA: Diagnosis not present

## 2021-04-05 DIAGNOSIS — F0390 Unspecified dementia without behavioral disturbance: Secondary | ICD-10-CM | POA: Diagnosis not present

## 2021-04-09 DIAGNOSIS — E11621 Type 2 diabetes mellitus with foot ulcer: Secondary | ICD-10-CM | POA: Diagnosis not present

## 2021-04-09 DIAGNOSIS — F0390 Unspecified dementia without behavioral disturbance: Secondary | ICD-10-CM | POA: Diagnosis not present

## 2021-04-09 DIAGNOSIS — M79672 Pain in left foot: Secondary | ICD-10-CM | POA: Diagnosis not present

## 2021-04-09 DIAGNOSIS — L97519 Non-pressure chronic ulcer of other part of right foot with unspecified severity: Secondary | ICD-10-CM | POA: Diagnosis not present

## 2021-04-09 DIAGNOSIS — M79671 Pain in right foot: Secondary | ICD-10-CM | POA: Diagnosis not present

## 2021-04-09 DIAGNOSIS — E1122 Type 2 diabetes mellitus with diabetic chronic kidney disease: Secondary | ICD-10-CM | POA: Diagnosis not present

## 2021-04-09 DIAGNOSIS — N189 Chronic kidney disease, unspecified: Secondary | ICD-10-CM | POA: Diagnosis not present

## 2021-04-09 DIAGNOSIS — I129 Hypertensive chronic kidney disease with stage 1 through stage 4 chronic kidney disease, or unspecified chronic kidney disease: Secondary | ICD-10-CM | POA: Diagnosis not present

## 2021-04-12 DIAGNOSIS — F0390 Unspecified dementia without behavioral disturbance: Secondary | ICD-10-CM | POA: Diagnosis not present

## 2021-04-12 DIAGNOSIS — I129 Hypertensive chronic kidney disease with stage 1 through stage 4 chronic kidney disease, or unspecified chronic kidney disease: Secondary | ICD-10-CM | POA: Diagnosis not present

## 2021-04-12 DIAGNOSIS — E1122 Type 2 diabetes mellitus with diabetic chronic kidney disease: Secondary | ICD-10-CM | POA: Diagnosis not present

## 2021-04-12 DIAGNOSIS — E11621 Type 2 diabetes mellitus with foot ulcer: Secondary | ICD-10-CM | POA: Diagnosis not present

## 2021-04-12 DIAGNOSIS — N189 Chronic kidney disease, unspecified: Secondary | ICD-10-CM | POA: Diagnosis not present

## 2021-04-12 DIAGNOSIS — L97519 Non-pressure chronic ulcer of other part of right foot with unspecified severity: Secondary | ICD-10-CM | POA: Diagnosis not present

## 2021-04-14 ENCOUNTER — Inpatient Hospital Stay (HOSPITAL_COMMUNITY)
Admission: EM | Admit: 2021-04-14 | Discharge: 2021-04-24 | DRG: 871 | Disposition: A | Discharge status: Hospice | Payer: Medicare Other | Source: Ambulatory Visit | Attending: Internal Medicine | Admitting: Internal Medicine

## 2021-04-14 ENCOUNTER — Encounter (HOSPITAL_COMMUNITY): Payer: Self-pay | Admitting: *Deleted

## 2021-04-14 ENCOUNTER — Emergency Department (HOSPITAL_COMMUNITY): Payer: Medicare Other

## 2021-04-14 ENCOUNTER — Other Ambulatory Visit: Payer: Self-pay

## 2021-04-14 DIAGNOSIS — E785 Hyperlipidemia, unspecified: Secondary | ICD-10-CM | POA: Diagnosis not present

## 2021-04-14 DIAGNOSIS — H04129 Dry eye syndrome of unspecified lacrimal gland: Secondary | ICD-10-CM | POA: Diagnosis not present

## 2021-04-14 DIAGNOSIS — Z66 Do not resuscitate: Secondary | ICD-10-CM | POA: Diagnosis not present

## 2021-04-14 DIAGNOSIS — N1831 Chronic kidney disease, stage 3a: Secondary | ICD-10-CM | POA: Diagnosis present

## 2021-04-14 DIAGNOSIS — Z885 Allergy status to narcotic agent status: Secondary | ICD-10-CM | POA: Diagnosis not present

## 2021-04-14 DIAGNOSIS — E1165 Type 2 diabetes mellitus with hyperglycemia: Secondary | ICD-10-CM | POA: Diagnosis not present

## 2021-04-14 DIAGNOSIS — Z20822 Contact with and (suspected) exposure to covid-19: Secondary | ICD-10-CM | POA: Diagnosis not present

## 2021-04-14 DIAGNOSIS — R52 Pain, unspecified: Secondary | ICD-10-CM | POA: Diagnosis not present

## 2021-04-14 DIAGNOSIS — A498 Other bacterial infections of unspecified site: Secondary | ICD-10-CM | POA: Diagnosis not present

## 2021-04-14 DIAGNOSIS — G40909 Epilepsy, unspecified, not intractable, without status epilepticus: Secondary | ICD-10-CM | POA: Diagnosis not present

## 2021-04-14 DIAGNOSIS — Z880 Allergy status to penicillin: Secondary | ICD-10-CM

## 2021-04-14 DIAGNOSIS — R54 Age-related physical debility: Secondary | ICD-10-CM | POA: Diagnosis present

## 2021-04-14 DIAGNOSIS — D631 Anemia in chronic kidney disease: Secondary | ICD-10-CM | POA: Diagnosis not present

## 2021-04-14 DIAGNOSIS — J439 Emphysema, unspecified: Secondary | ICD-10-CM | POA: Diagnosis not present

## 2021-04-14 DIAGNOSIS — I10 Essential (primary) hypertension: Secondary | ICD-10-CM | POA: Diagnosis present

## 2021-04-14 DIAGNOSIS — G40211 Localization-related (focal) (partial) symptomatic epilepsy and epileptic syndromes with complex partial seizures, intractable, with status epilepticus: Secondary | ICD-10-CM | POA: Diagnosis not present

## 2021-04-14 DIAGNOSIS — Z7189 Other specified counseling: Secondary | ICD-10-CM | POA: Diagnosis not present

## 2021-04-14 DIAGNOSIS — Z8249 Family history of ischemic heart disease and other diseases of the circulatory system: Secondary | ICD-10-CM

## 2021-04-14 DIAGNOSIS — I4892 Unspecified atrial flutter: Secondary | ICD-10-CM | POA: Diagnosis present

## 2021-04-14 DIAGNOSIS — R5381 Other malaise: Secondary | ICD-10-CM | POA: Diagnosis not present

## 2021-04-14 DIAGNOSIS — R531 Weakness: Secondary | ICD-10-CM

## 2021-04-14 DIAGNOSIS — B964 Proteus (mirabilis) (morganii) as the cause of diseases classified elsewhere: Secondary | ICD-10-CM | POA: Diagnosis present

## 2021-04-14 DIAGNOSIS — Z882 Allergy status to sulfonamides status: Secondary | ICD-10-CM | POA: Diagnosis not present

## 2021-04-14 DIAGNOSIS — R0689 Other abnormalities of breathing: Secondary | ICD-10-CM | POA: Diagnosis not present

## 2021-04-14 DIAGNOSIS — G9341 Metabolic encephalopathy: Secondary | ICD-10-CM | POA: Diagnosis not present

## 2021-04-14 DIAGNOSIS — E860 Dehydration: Secondary | ICD-10-CM | POA: Diagnosis present

## 2021-04-14 DIAGNOSIS — E1169 Type 2 diabetes mellitus with other specified complication: Secondary | ICD-10-CM | POA: Diagnosis present

## 2021-04-14 DIAGNOSIS — I69354 Hemiplegia and hemiparesis following cerebral infarction affecting left non-dominant side: Secondary | ICD-10-CM

## 2021-04-14 DIAGNOSIS — E7800 Pure hypercholesterolemia, unspecified: Secondary | ICD-10-CM | POA: Diagnosis present

## 2021-04-14 DIAGNOSIS — I959 Hypotension, unspecified: Secondary | ICD-10-CM | POA: Diagnosis not present

## 2021-04-14 DIAGNOSIS — Z7982 Long term (current) use of aspirin: Secondary | ICD-10-CM | POA: Diagnosis not present

## 2021-04-14 DIAGNOSIS — R569 Unspecified convulsions: Secondary | ICD-10-CM | POA: Diagnosis not present

## 2021-04-14 DIAGNOSIS — Z7984 Long term (current) use of oral hypoglycemic drugs: Secondary | ICD-10-CM

## 2021-04-14 DIAGNOSIS — Z515 Encounter for palliative care: Secondary | ICD-10-CM

## 2021-04-14 DIAGNOSIS — E1122 Type 2 diabetes mellitus with diabetic chronic kidney disease: Secondary | ICD-10-CM | POA: Diagnosis present

## 2021-04-14 DIAGNOSIS — N179 Acute kidney failure, unspecified: Secondary | ICD-10-CM | POA: Diagnosis not present

## 2021-04-14 DIAGNOSIS — K219 Gastro-esophageal reflux disease without esophagitis: Secondary | ICD-10-CM | POA: Diagnosis present

## 2021-04-14 DIAGNOSIS — I129 Hypertensive chronic kidney disease with stage 1 through stage 4 chronic kidney disease, or unspecified chronic kidney disease: Secondary | ICD-10-CM | POA: Diagnosis present

## 2021-04-14 DIAGNOSIS — Z90710 Acquired absence of both cervix and uterus: Secondary | ICD-10-CM

## 2021-04-14 DIAGNOSIS — Z79899 Other long term (current) drug therapy: Secondary | ICD-10-CM

## 2021-04-14 DIAGNOSIS — Z853 Personal history of malignant neoplasm of breast: Secondary | ICD-10-CM

## 2021-04-14 DIAGNOSIS — E876 Hypokalemia: Secondary | ICD-10-CM | POA: Diagnosis not present

## 2021-04-14 DIAGNOSIS — F0390 Unspecified dementia without behavioral disturbance: Secondary | ICD-10-CM

## 2021-04-14 DIAGNOSIS — A419 Sepsis, unspecified organism: Secondary | ICD-10-CM

## 2021-04-14 DIAGNOSIS — R6521 Severe sepsis with septic shock: Secondary | ICD-10-CM | POA: Diagnosis present

## 2021-04-14 DIAGNOSIS — Z8616 Personal history of COVID-19: Secondary | ICD-10-CM | POA: Diagnosis not present

## 2021-04-14 DIAGNOSIS — N183 Chronic kidney disease, stage 3 unspecified: Secondary | ICD-10-CM | POA: Diagnosis present

## 2021-04-14 DIAGNOSIS — G311 Senile degeneration of brain, not elsewhere classified: Secondary | ICD-10-CM | POA: Diagnosis not present

## 2021-04-14 DIAGNOSIS — F0280 Dementia in other diseases classified elsewhere without behavioral disturbance: Secondary | ICD-10-CM | POA: Diagnosis present

## 2021-04-14 DIAGNOSIS — R1312 Dysphagia, oropharyngeal phase: Secondary | ICD-10-CM | POA: Diagnosis present

## 2021-04-14 DIAGNOSIS — R6520 Severe sepsis without septic shock: Secondary | ICD-10-CM | POA: Diagnosis not present

## 2021-04-14 DIAGNOSIS — I471 Supraventricular tachycardia: Secondary | ICD-10-CM | POA: Diagnosis present

## 2021-04-14 DIAGNOSIS — R4182 Altered mental status, unspecified: Secondary | ICD-10-CM | POA: Diagnosis not present

## 2021-04-14 DIAGNOSIS — A4159 Other Gram-negative sepsis: Principal | ICD-10-CM | POA: Diagnosis present

## 2021-04-14 DIAGNOSIS — R000 Tachycardia, unspecified: Secondary | ICD-10-CM | POA: Diagnosis not present

## 2021-04-14 DIAGNOSIS — Z794 Long term (current) use of insulin: Secondary | ICD-10-CM

## 2021-04-14 DIAGNOSIS — R279 Unspecified lack of coordination: Secondary | ICD-10-CM | POA: Diagnosis not present

## 2021-04-14 DIAGNOSIS — N3001 Acute cystitis with hematuria: Secondary | ICD-10-CM | POA: Diagnosis present

## 2021-04-14 DIAGNOSIS — F419 Anxiety disorder, unspecified: Secondary | ICD-10-CM | POA: Diagnosis not present

## 2021-04-14 DIAGNOSIS — R682 Dry mouth, unspecified: Secondary | ICD-10-CM | POA: Diagnosis not present

## 2021-04-14 LAB — PROTIME-INR
INR: 1.1 (ref 0.8–1.2)
Prothrombin Time: 13.7 seconds (ref 11.4–15.2)

## 2021-04-14 LAB — CBC WITH DIFFERENTIAL/PLATELET
Abs Immature Granulocytes: 0.02 10*3/uL (ref 0.00–0.07)
Basophils Absolute: 0.1 10*3/uL (ref 0.0–0.1)
Basophils Relative: 1 %
Eosinophils Absolute: 0 10*3/uL (ref 0.0–0.5)
Eosinophils Relative: 0 %
HCT: 32.8 % — ABNORMAL LOW (ref 36.0–46.0)
Hemoglobin: 10.5 g/dL — ABNORMAL LOW (ref 12.0–15.0)
Immature Granulocytes: 0 %
Lymphocytes Relative: 11 %
Lymphs Abs: 0.9 10*3/uL (ref 0.7–4.0)
MCH: 28.5 pg (ref 26.0–34.0)
MCHC: 32 g/dL (ref 30.0–36.0)
MCV: 88.9 fL (ref 80.0–100.0)
Monocytes Absolute: 0.5 10*3/uL (ref 0.1–1.0)
Monocytes Relative: 6 %
Neutro Abs: 6.9 10*3/uL (ref 1.7–7.7)
Neutrophils Relative %: 82 %
Platelets: 295 10*3/uL (ref 150–400)
RBC: 3.69 MIL/uL — ABNORMAL LOW (ref 3.87–5.11)
RDW: 14.2 % (ref 11.5–15.5)
WBC: 8.4 10*3/uL (ref 4.0–10.5)
nRBC: 0 % (ref 0.0–0.2)

## 2021-04-14 LAB — MAGNESIUM: Magnesium: 1.7 mg/dL (ref 1.7–2.4)

## 2021-04-14 LAB — URINALYSIS, ROUTINE W REFLEX MICROSCOPIC
Bacteria, UA: NONE SEEN
Bilirubin Urine: NEGATIVE
Glucose, UA: NEGATIVE mg/dL
Hgb urine dipstick: NEGATIVE
Ketones, ur: 5 mg/dL — AB
Nitrite: NEGATIVE
Protein, ur: 100 mg/dL — AB
Specific Gravity, Urine: 1.018 (ref 1.005–1.030)
WBC, UA: >50 WBC/hpf — ABNORMAL HIGH (ref 0–5)
pH: 6 (ref 5.0–8.0)

## 2021-04-14 LAB — COMPREHENSIVE METABOLIC PANEL
ALT: 11 U/L (ref 0–44)
AST: 19 U/L (ref 15–41)
Albumin: 2.7 g/dL — ABNORMAL LOW (ref 3.5–5.0)
Alkaline Phosphatase: 66 U/L (ref 38–126)
Anion gap: 10 (ref 5–15)
BUN: 15 mg/dL (ref 8–23)
CO2: 27 mmol/L (ref 22–32)
Calcium: 8.3 mg/dL — ABNORMAL LOW (ref 8.9–10.3)
Chloride: 101 mmol/L (ref 98–111)
Creatinine, Ser: 1.1 mg/dL — ABNORMAL HIGH (ref 0.44–1.00)
GFR, Estimated: 49 mL/min — ABNORMAL LOW (ref >60)
Glucose, Bld: 214 mg/dL — ABNORMAL HIGH (ref 70–99)
Potassium: 4.3 mmol/L (ref 3.5–5.1)
Sodium: 138 mmol/L (ref 135–145)
Total Bilirubin: 0.6 mg/dL (ref 0.3–1.2)
Total Protein: 5.7 g/dL — ABNORMAL LOW (ref 6.5–8.1)

## 2021-04-14 LAB — TROPONIN I (HIGH SENSITIVITY)
Troponin I (High Sensitivity): 21 ng/L — ABNORMAL HIGH (ref <18)
Troponin I (High Sensitivity): 24 ng/L — ABNORMAL HIGH (ref <18)

## 2021-04-14 LAB — RESP PANEL BY RT-PCR (FLU A&B, COVID) ARPGX2
Influenza A by PCR: NEGATIVE
Influenza B by PCR: NEGATIVE
SARS Coronavirus 2 by RT PCR: NEGATIVE

## 2021-04-14 LAB — MRSA NEXT GEN BY PCR, NASAL: MRSA by PCR Next Gen: NOT DETECTED

## 2021-04-14 LAB — APTT: aPTT: 25 seconds (ref 24–36)

## 2021-04-14 LAB — LACTIC ACID, PLASMA
Lactic Acid, Venous: 3 mmol/L (ref 0.5–1.9)
Lactic Acid, Venous: 2 mmol/L (ref 0.5–1.9)

## 2021-04-14 LAB — TSH: TSH: 1.504 u[IU]/mL (ref 0.350–4.500)

## 2021-04-14 LAB — GLUCOSE, CAPILLARY: Glucose-Capillary: 203 mg/dL — ABNORMAL HIGH (ref 70–99)

## 2021-04-14 MED ORDER — METRONIDAZOLE 500 MG/100ML IV SOLN
500.0000 mg | Freq: Once | INTRAVENOUS | Status: AC
  Administered 2021-04-14: 500 mg via INTRAVENOUS
  Filled 2021-04-14: qty 100

## 2021-04-14 MED ORDER — METOPROLOL TARTRATE 5 MG/5ML IV SOLN
5.0000 mg | Freq: Once | INTRAVENOUS | Status: AC
  Administered 2021-04-14: 5 mg via INTRAVENOUS
  Filled 2021-04-14: qty 5

## 2021-04-14 MED ORDER — VANCOMYCIN HCL IN DEXTROSE 1-5 GM/200ML-% IV SOLN: 1000.0000 mg | INTRAVENOUS | Status: DC

## 2021-04-14 MED ORDER — ACETAMINOPHEN 650 MG RE SUPP
650.0000 mg | Freq: Once | RECTAL | Status: AC
  Administered 2021-04-14: 650 mg via RECTAL
  Filled 2021-04-14: qty 1

## 2021-04-14 MED ORDER — CHLORHEXIDINE GLUCONATE 0.12 % MT SOLN
15.0000 mL | Freq: Two times a day (BID) | OROMUCOSAL | Status: DC
  Administered 2021-04-14 – 2021-04-24 (×18): 15 mL via OROMUCOSAL
  Filled 2021-04-14 (×15): qty 15

## 2021-04-14 MED ORDER — VANCOMYCIN HCL 1500 MG/300ML IV SOLN
1500.0000 mg | Freq: Once | INTRAVENOUS | Status: AC
  Administered 2021-04-14: 1500 mg via INTRAVENOUS
  Filled 2021-04-14: qty 300

## 2021-04-14 MED ORDER — METOPROLOL TARTRATE 5 MG/5ML IV SOLN
5.0000 mg | INTRAVENOUS | Status: AC
  Administered 2021-04-14: 5 mg via INTRAVENOUS
  Filled 2021-04-14: qty 5

## 2021-04-14 MED ORDER — INSULIN ASPART 100 UNIT/ML IJ SOLN
0.0000 [IU] | Freq: Every day | INTRAMUSCULAR | Status: DC
Start: 2021-04-14 — End: 2021-04-24
  Administered 2021-04-14: 2 [IU] via SUBCUTANEOUS

## 2021-04-14 MED ORDER — ORAL CARE MOUTH RINSE
15.0000 mL | Freq: Two times a day (BID) | OROMUCOSAL | Status: DC
  Administered 2021-04-15 – 2021-04-24 (×18): 15 mL via OROMUCOSAL

## 2021-04-14 MED ORDER — AMIODARONE LOAD VIA INFUSION
150.0000 mg | Freq: Once | INTRAVENOUS | Status: AC
  Administered 2021-04-14: 150 mg via INTRAVENOUS
  Filled 2021-04-14: qty 83.34

## 2021-04-14 MED ORDER — LACTATED RINGERS IV BOLUS
1500.0000 mL | Freq: Once | INTRAVENOUS | Status: AC
  Administered 2021-04-14: 1500 mL via INTRAVENOUS

## 2021-04-14 MED ORDER — ONDANSETRON HCL 4 MG/2ML IJ SOLN
4.0000 mg | Freq: Four times a day (QID) | INTRAMUSCULAR | Status: DC | PRN
Start: 2021-04-14 — End: 2021-04-24

## 2021-04-14 MED ORDER — ACETAMINOPHEN 650 MG RE SUPP
650.0000 mg | Freq: Four times a day (QID) | RECTAL | Status: DC | PRN
  Administered 2021-04-14: 650 mg via RECTAL
  Filled 2021-04-14: qty 1

## 2021-04-14 MED ORDER — SODIUM CHLORIDE 0.9 % IV SOLN: INTRAVENOUS | Status: AC

## 2021-04-14 MED ORDER — VANCOMYCIN HCL IN DEXTROSE 1-5 GM/200ML-% IV SOLN
1000.0000 mg | Freq: Once | INTRAVENOUS | Status: DC
  Filled 2021-04-14: qty 200

## 2021-04-14 MED ORDER — ENOXAPARIN SODIUM 40 MG/0.4ML IJ SOSY
40.0000 mg | PREFILLED_SYRINGE | INTRAMUSCULAR | Status: DC
  Administered 2021-04-14 – 2021-04-19 (×6): 40 mg via SUBCUTANEOUS
  Filled 2021-04-14 (×6): qty 0.4

## 2021-04-14 MED ORDER — CHLORHEXIDINE GLUCONATE CLOTH 2 % EX PADS
6.0000 | MEDICATED_PAD | Freq: Every day | CUTANEOUS | Status: DC
  Administered 2021-04-14 – 2021-04-24 (×12): 6 via TOPICAL

## 2021-04-14 MED ORDER — LORAZEPAM 2 MG/ML IJ SOLN
1.0000 mg | INTRAMUSCULAR | Status: DC | PRN
  Administered 2021-04-16: 2 mg via INTRAVENOUS
  Filled 2021-04-14: qty 1

## 2021-04-14 MED ORDER — INSULIN ASPART 100 UNIT/ML IJ SOLN
0.0000 [IU] | Freq: Three times a day (TID) | INTRAMUSCULAR | Status: DC
  Administered 2021-04-15 – 2021-04-16 (×2): 1 [IU] via SUBCUTANEOUS
  Administered 2021-04-16: 2 [IU] via SUBCUTANEOUS
  Administered 2021-04-18 (×2): 1 [IU] via SUBCUTANEOUS
  Administered 2021-04-19 – 2021-04-20 (×5): 2 [IU] via SUBCUTANEOUS
  Administered 2021-04-20: 1 [IU] via SUBCUTANEOUS
  Administered 2021-04-21 – 2021-04-22 (×5): 2 [IU] via SUBCUTANEOUS
  Administered 2021-04-23: 3 [IU] via SUBCUTANEOUS
  Administered 2021-04-23 (×2): 2 [IU] via SUBCUTANEOUS
  Administered 2021-04-24: 1 [IU] via SUBCUTANEOUS

## 2021-04-14 MED ORDER — AMIODARONE HCL IN DEXTROSE 360-4.14 MG/200ML-% IV SOLN
60.0000 mg/h | INTRAVENOUS | Status: AC
  Administered 2021-04-14 (×2): 60 mg/h via INTRAVENOUS
  Filled 2021-04-14 (×2): qty 200

## 2021-04-14 MED ORDER — LACTATED RINGERS IV SOLN: INTRAVENOUS | Status: DC

## 2021-04-14 MED ORDER — SODIUM CHLORIDE 0.9 % IV BOLUS
500.0000 mL | Freq: Once | INTRAVENOUS | Status: AC
  Administered 2021-04-14: 500 mL via INTRAVENOUS

## 2021-04-14 MED ORDER — AMIODARONE HCL IN DEXTROSE 360-4.14 MG/200ML-% IV SOLN
30.0000 mg/h | INTRAVENOUS | Status: DC
  Filled 2021-04-14: qty 200

## 2021-04-14 MED ORDER — SODIUM CHLORIDE 0.9 % IV SOLN
2.0000 g | Freq: Once | INTRAVENOUS | Status: AC
  Administered 2021-04-14: 2 g via INTRAVENOUS
  Filled 2021-04-14: qty 2

## 2021-04-14 MED ORDER — SODIUM CHLORIDE 0.9 % IV SOLN: 2.0000 g | Freq: Two times a day (BID) | INTRAVENOUS | Status: DC

## 2021-04-14 MED ORDER — SODIUM CHLORIDE 0.9 % IV SOLN
1.0000 g | INTRAVENOUS | Status: DC
  Administered 2021-04-15 – 2021-04-16 (×2): 1 g via INTRAVENOUS
  Filled 2021-04-14 (×2): qty 10

## 2021-04-14 MED ORDER — SODIUM CHLORIDE 0.9 % IV SOLN
2.0000 g | Freq: Once | INTRAVENOUS | Status: DC
  Filled 2021-04-14: qty 2

## 2021-04-14 MED ORDER — ONDANSETRON HCL 4 MG PO TABS: 4.0000 mg | ORAL_TABLET | Freq: Four times a day (QID) | ORAL | Status: DC | PRN

## 2021-04-14 MED ORDER — ACETAMINOPHEN 325 MG PO TABS: 650.0000 mg | ORAL_TABLET | Freq: Four times a day (QID) | ORAL | Status: DC | PRN

## 2021-04-14 MED ORDER — ENSURE ENLIVE PO LIQD
237.0000 mL | Freq: Two times a day (BID) | ORAL | Status: DC
  Administered 2021-04-15 – 2021-04-19 (×4): 237 mL via ORAL

## 2021-04-14 MED ORDER — POLYETHYLENE GLYCOL 3350 17 G PO PACK: 17.0000 g | PACK | Freq: Every day | ORAL | Status: DC | PRN

## 2021-04-14 NOTE — ED Provider Notes (Signed)
[facility] EMERGENCY DEPARTMENT Provider Note   CSN: [id number] Arrival date & time: 04/14/21  1258     History Chief Complaint  Patient presents with   Seizures    [place] is a 87 y.o. female.  HPI  Patient is an 87 year old female with a history of hypertension, stroke, TIA, diabetes mellitus, left-sided weakness, dementia, who presents to the emergency department due to a possible seizure.  Patient has no history of prior seizures.  Patient resides at [place].  They state that patient had a witnessed episode of seizure-like activity prior to EMS arrival.  This lasted for about 5 minutes.  When EMS arrived she was given 2.5 mg of IV Versed.  She then began shaking once again and was given another 2.5 mg of IV Versed.  No further episodes of similar activity.  Patient is demented at baseline.  Currently A&O x1.  Following commands but extremely fatigued.  Left arm is in forced flexion.  I spoke to nursing staff at [place].  They state that around 10:30 AM she appeared fine.  They went to feed her lunch around noon and patient was shaking but still verbal and oriented x1, which is her baseline.  They state that she has done this in the past but is typically due to hypoglycemia but states that her sugars have been elevated all day today.  She does have chronic wounds to her feet and nursing staff states that they have been recently x-rayed and were negative for osteomyelitis.  Level 5 caveat due to dementia.     Past Medical History:  Diagnosis Date   DCIS (ductal carcinoma in situ) of breast    left breast   Diabetes mellitus    GERD (gastroesophageal reflux disease)    High cholesterol    History of TIA (transient ischemic attack)    Hypertension    Stroke [facility])     Patient Active Problem List   Diagnosis Date Noted   COVID-19 virus infection    Thrombocytopenia ([place])    Hx of transient ischemic attack (TIA)    Acute renal failure superimposed on  stage 3b chronic kidney disease ([place]) 11/21/2020   AKI (acute kidney injury) ([place]) 11/20/2020   Acute ischemic cerebrovascular accident (CVA) involving right middle cerebral artery territory ([place]) 08/20/2018   Narrow complex tachycardia ([place]) 08/20/2018   Left-sided weakness 08/19/2018   Essential hypertension 08/19/2018   HLD (hyperlipidemia) 08/19/2018   CKD (chronic kidney disease) stage 3, GFR 30-59 ml/min ([place]) 08/19/2018   Type 2 diabetes mellitus with hyperlipidemia ([place]) 08/19/2018    Past Surgical History:  Procedure Laterality Date   ABDOMINAL HYSTERECTOMY     BREAST LUMPECTOMY     CESAREAN SECTION     LOOP RECORDER INSERTION N/A 08/23/2018   Procedure: LOOP RECORDER INSERTION;  Surgeon: Evans Lance, MD;  Location: [place] CV LAB;  Service: Cardiovascular;  Laterality: N/A;   TEE WITHOUT CARDIOVERSION N/A 08/23/2018   Procedure: TRANSESOPHAGEAL ECHOCARDIOGRAM (TEE);  Surgeon: Dorothy Spark, MD;  Location: [facility] ENDOSCOPY;  Service: Cardiovascular;  Laterality: N/A;     OB History   No obstetric history on file.     Family History  Problem Relation Age of Onset   Cancer Mother    Hypertension Father     Social History   Tobacco Use   Smoking status: Never   Smokeless tobacco: Never  Vaping Use   Vaping Use: Never used  Substance Use Topics  Alcohol use: No   Drug use: No    Home Medications Prior to Admission medications   Medication Sig Start Date End Date Taking? Authorizing Provider  aspirin 325 MG tablet Take 1 tablet (325 mg total) by mouth daily. Patient not taking: No sig reported 08/24/18   Samuella Cota, MD  Ergocalciferol (VITAMIN D2) 50 MCG (2000 UT) TABS Take 1 capsule by mouth daily.    [provider]  furosemide (LASIX) 40 MG tablet Take 0.5 tablets (20 mg total) by mouth daily. 11/26/20   Barton Dubois, MD  linagliptin (TRADJENTA) 5 MG TABS tablet Take 5 mg by mouth daily.    [provider]  loratadine  (CLARITIN) 10 MG tablet Take 10 mg by mouth daily.    [provider]  LORazepam (ATIVAN) 0.5 MG tablet Take 0.5 mg by mouth daily as needed for anxiety.    [provider]  metoprolol succinate (TOPROL-XL) 25 MG 24 hr tablet Take 25 mg by mouth daily.    [provider]  rosuvastatin (CRESTOR) 20 MG tablet Take 20 mg by mouth daily.  07/16/18   [provider]  sertraline (ZOLOFT) 25 MG tablet Take 25 mg by mouth at bedtime.  03/27/15   [provider]  TOUJEO SOLOSTAR 300 UNIT/ML Solostar Pen Inject 25 Units into the skin daily. Prime pen with 3u prior to each use 11/22/20   Barton Dubois, MD    Allergies    Codeine, Penicillins, and Sulfa antibiotics  Review of Systems   Review of Systems  All other systems reviewed and are negative. Ten systems reviewed and are negative for acute change, except as noted in the HPI.   Physical Exam Updated Vital Signs BP (!) 155/86   Pulse (!) 153   Temp (!) 101.7 F (38.7 C) (Rectal)   Resp (!) 22   Ht 5\' 6"  (1.676 m)   Wt 74 kg   SpO2 92%   BMI 26.33 kg/m   Physical Exam Vitals and nursing note reviewed.  Constitutional:      General: She is not in acute distress.    Appearance: Normal appearance. She is not ill-appearing, toxic-appearing or diaphoretic.     Comments: A&O x1.  Lying supine.  Extremely fatigued but will open eyes and follow basic commands when asked.  HENT:     Head: Normocephalic and atraumatic.     Right Ear: External ear normal.     Left Ear: External ear normal.     Nose: Nose normal.     Mouth/Throat:     Mouth: Mucous membranes are moist.     Pharynx: Oropharynx is clear. No oropharyngeal exudate or posterior oropharyngeal erythema.  Eyes:     General: No scleral icterus.       Right eye: No discharge.        Left eye: No discharge.     Extraocular Movements: Extraocular movements intact.     Conjunctiva/sclera: Conjunctivae normal.     Pupils: Pupils are equal, round,  and reactive to light.  Cardiovascular:     Rate and Rhythm: Normal rate and regular rhythm.     Pulses: Normal pulses.     Heart sounds: Normal heart sounds. No murmur heard.   No friction rub. No gallop.  Pulmonary:     Effort: Pulmonary effort is normal. No respiratory distress.     Breath sounds: Normal breath sounds. No stridor. No wheezing, rhonchi or rales.  Abdominal:     General:  Abdomen is flat.     Tenderness: There is no abdominal tenderness.  Musculoskeletal:        General: Normal range of motion.     Cervical back: Normal range of motion and neck supple. No tenderness.  Skin:    General: Skin is warm and dry.     Comments: Chronic wounds noted to the plantar aspect of the left foot and right second toe.  No purulence at the sites.  Mild surrounding erythema.  Neurological:     Comments: Left arm and hand in forced flexion.  A&O x1.  Extremely fatigued appearing at not clearly answering questions.  Psychiatric:        Mood and Affect: Mood normal.        Behavior: Behavior normal.   ED Results / Procedures / Treatments   Labs (all labs ordered are listed, but only abnormal results are displayed) Labs Reviewed  LACTIC ACID, PLASMA - Abnormal; Notable for the following components:      Result Value   Lactic Acid, Venous 3.0 (*)    All other components within normal limits  COMPREHENSIVE METABOLIC PANEL - Abnormal; Notable for the following components:   Glucose, Bld 214 (*)    Creatinine, Ser 1.10 (*)    Calcium 8.3 (*)    Total Protein 5.7 (*)    Albumin 2.7 (*)    GFR, Estimated 49 (*)    All other components within normal limits  CBC WITH DIFFERENTIAL/PLATELET - Abnormal; Notable for the following components:   RBC 3.69 (*)    Hemoglobin 10.5 (*)    HCT 32.8 (*)    All other components within normal limits  URINALYSIS, ROUTINE W REFLEX MICROSCOPIC - Abnormal; Notable for the following components:   APPearance TURBID (*)    Ketones, ur 5 (*)    Protein,  ur 100 (*)    Leukocytes,Ua LARGE (*)    WBC, UA >50 (*)    All other components within normal limits  RESP PANEL BY RT-PCR (FLU A&B, COVID) ARPGX2  CULTURE, BLOOD (ROUTINE X 2)  CULTURE, BLOOD (ROUTINE X 2)  URINE CULTURE  PROTIME-INR  APTT  LACTIC ACID, PLASMA   EKG None  Radiology CT Head Wo Contrast  Result Date: 04/14/2021 CLINICAL DATA:  Delirium.  New onset seizure.  Fever. EXAM: CT HEAD WITHOUT CONTRAST TECHNIQUE: Contiguous axial images were obtained from the base of the skull through the vertex without intravenous contrast. COMPARISON:  10/03/2018 FINDINGS: Brain: The right MCA infarct on the prior CT has evolved, now with a large area of encephalomalacia in the temporal and parietal lobes with associated cortical mineralization and ex vacuo dilatation of the temporal horn of the right lateral ventricle. Small chronic left parietal and left temporal occipital infarcts are unchanged. There are new chronic infarcts involving the right frontal operculum (MCA territory) and right occipital and medial temporal lobes (PCA territory). Chronic lacunar infarcts are again noted in the deep cerebral white matter and cerebellum. No definite acute infarct, intracranial hemorrhage, mass, midline shift, or extra-axial fluid collection is identified. Vascular: Calcified atherosclerosis at the skull base. No hyperdense vessel. Skull: No fracture or suspicious osseous lesion. Sinuses/Orbits: Mild mucosal thickening in the paranasal sinuses. Small volume fluid in the left sphenoid sinus. Clear mastoid air cells. Bilateral cataract extraction. Other: None. IMPRESSION: 1. No evidence of acute intracranial abnormality. 2. Numerous chronic infarcts as above including interval right MCA and right PCA infarcts. Electronically Signed   By: Seymour Bars.D.  On: 04/14/2021 14:39   DG Chest Port 1 View  Result Date: 04/14/2021 CLINICAL DATA:  Questionable sepsis. EXAM: PORTABLE CHEST 1 VIEW COMPARISON:   October 03, 2018 FINDINGS: The heart size and mediastinal contours are within normal limits. Both lungs are clear. The visualized skeletal structures are unremarkable. IMPRESSION: No active disease. Electronically Signed   By: Dorise Bullion III M.D   On: 04/14/2021 13:48    Procedures .Critical Care  Date/Time: 04/14/2021 2:17 PM Performed by: Rayna Sexton, PA-C Authorized by: Rayna Sexton, PA-C   Critical care provider statement:    Critical care time (minutes):  30   Critical care was necessary to treat or prevent imminent or life-threatening deterioration of the following conditions:  Sepsis   Critical care was time spent personally by me on the following activities:  Discussions with consultants, evaluation of patient's response to treatment, examination of patient, ordering and performing treatments and interventions, ordering and review of laboratory studies, ordering and review of radiographic studies, pulse oximetry, re-evaluation of patient's condition, obtaining history from patient or surrogate and review of old charts   Medications Ordered in ED Medications  lactated ringers infusion (has no administration in time range)  vancomycin (VANCOREADY) IVPB 1500 mg/300 mL (1,500 mg Intravenous New Bag/Given 04/14/21 1348)  ceFEPIme (MAXIPIME) 2 g in sodium chloride 0.9 % 100 mL IVPB (has no administration in time range)  vancomycin (VANCOCIN) IVPB 1000 mg/200 mL premix (has no administration in time range)  lactated ringers bolus 1,500 mL (1,500 mLs Intravenous New Bag/Given 04/14/21 1323)  acetaminophen (TYLENOL) suppository 650 mg (650 mg Rectal Given by Other 04/14/21 1324)  metroNIDAZOLE (FLAGYL) IVPB 500 mg (500 mg Intravenous New Bag/Given 04/14/21 1349)  ceFEPIme (MAXIPIME) 2 g in sodium chloride 0.9 % 100 mL IVPB (0 g Intravenous Stopped 04/14/21 1439)   ED Course  I have reviewed the triage vital signs and the nursing notes.  Pertinent labs & imaging results that were  available during my care of the patient were reviewed by me and considered in my medical decision making (see chart for details).  Clinical Course as of 04/14/21 1610  Sun Apr 14, 2021  1359 Lactic Acid, Venous(!!): 3.0 [LJ]  1416 WBC Clumps: PRESENT [LJ]  1416 WBC, UA(!): >50 [LJ]  1416 Leukocytes,Ua(!): LARGE [LJ]  1608 Patient discussed with Dr. Alda Lea with cardiology.  We reviewed her telemetry and given patient's heart rate quickly increasing to 150 bpm and also has been steady at around 150 bpm feels that this is likely atrial flutter.  Recommends 5 mg of IV metoprolol and close monitoring.  If no improvement consider IV amiodarone.  Agrees with medicine admission. [LJ]    Clinical Course User Index [LJ] Rayna Sexton, PA-C   MDM Rules/Calculators/A&P                          Pt is a 87 y.o. female who presents to the emergency department with what appears to be urosepsis.  Labs: CBC with an RBC of 3.69, hemoglobin of 10.5, hematocrit 32.8. CMP with a glucose of 214, creatinine of 1.1, calcium of 8.3, total protein of 5.7, albumin of 2.7, GFR 49. A PTT of 25. PT and INR within normal limits. Lactic acid elevated at 3. UA showing 5 ketones, 100 protein, large leukocytes, 21-50 RBCs, greater than 50 white blood cells, white blood cell clumps.  Imaging: Chest x-ray is negative. CT scan of the head without contrast shows no evidence  of acute intracranial abnormality.  There are numerous chronic infarcts as above including interval right MCA and right PCA infarcts.  I, Rayna Sexton, PA-C, personally reviewed and evaluated these images and lab results as part of my medical decision-making.  When EMS was called patient was noted to have "shaking episodes".  I spoke to nursing staff who states that patient was verbal during these episodes and was found to be febrile.  Likely rigors versus seizure activity.  Patient had a similar episode since arrival that was witnessed by the  nursing staff and they state that she was verbal throughout the episode.  CT scan of the head shows no acute abnormalities.  Code sepsis initiated upon arrival.  Patient started on broad-spectrum antibiotics and given 1500 cc of lactated Ringer's.  She was given 500 cc of normal saline in route.  UA shows large leukocytes, greater than 50 white blood cells, white blood cell clumps.  Elevated lactic acid.  Likely a urinary source.  Around 3:30 PM patient suddenly became more tachycardic around 150 bpm.  Heart rate has been steady.  Patient discussed with cardiology who feels this is likely atrial flutter.  Recommends 5 mg of IV metoprolol.  Closely monitor and if no improvement consider IV amiodarone.  Patient will require admission for further management.  Will discuss with the medicine team.  Note: Portions of this report may have been transcribed using voice recognition software. Every effort was made to ensure accuracy; however, inadvertent computerized transcription errors may be present.   Final Clinical Impression(s) / ED Diagnoses Final diagnoses:  Sepsis, due to unspecified organism, unspecified whether acute organ dysfunction present [facility])  Acute cystitis with hematuria   Rx / DC Orders ED Discharge Orders     None        Rayna Sexton, PA-C 04/14/21 [place], Alvin Critchley, DO 04/15/21 [phone]

## 2021-04-14 NOTE — ED Notes (Signed)
Blood cultures collected before antibiotics started.  °

## 2021-04-14 NOTE — Progress Notes (Signed)
Pharmacy Antibiotic Note  Rebecca Hardy is a 87 y.o. female admitted on 04/14/2021 with  unknown source .  Pharmacy has been consulted for Vancomycin and cefepime dosing.  Plan: Vancomycin 1500mg  loading dose, then 1000mg  IV q24h for AUC of 511 Cefepime 2gm IV q12h F/U cxs and clinical progress Monitor V/S, labs and levels as indicated  Height: 5\' 6"  (167.6 cm) Weight: 74 kg (163 lb 2.3 oz) IBW/kg (Calculated) : 59.3  Temp (24hrs), Avg:101.7 F (38.7 C), Min:101.7 F (38.7 C), Max:101.7 F (38.7 C)  Recent Labs  Lab 04/14/21 1339  WBC 8.4  CREATININE 1.10*  LATICACIDVEN 3.0*    Estimated Creatinine Clearance: 38.5 mL/min (A) (by C-G formula based on SCr of 1.1 mg/dL (H)).    Allergies  Allergen Reactions   Codeine Other (See Comments)    Unknown reaction   Penicillins Other (See Comments)    Unknown reaction  Has patient had a PCN reaction causing immediate rash, facial/tongue/throat swelling, SOB or lightheadedness with hypotension: Uknown Has patient had a PCN reaction causing severe rash involving mucus membranes or skin necrosis: Uknown Has patient had a PCN reaction that required hospitalization: Unknown Has patient had a PCN reaction occurring within the last 10 years: No If all of the above answers are "NO", then may proceed with Cephalosporin use.   Sulfa Antibiotics     Antimicrobials this admission: Cefepime 6/26 >>  Vancomycin  6/26 >>   Microbiology results: 6/26 BCx: pending 6/26 UCx: pending   MRSA PCR:   Thank you for allowing pharmacy to be a part of this patient's care.  Isac Sarna, BS Pharm D, BCPS Clinical Pharmacist Pager [phone] 04/14/2021 2:26 PM

## 2021-04-14 NOTE — ED Notes (Signed)
Md made aware that pts heart rate remains in the 140s

## 2021-04-14 NOTE — Plan of Care (Signed)
Patient admitted with Sepsis. On MIVF and Amio infusions. Total assistance with ADLs. Left-sided paralysis.    Problem: Education: Goal: Knowledge of General Education information will improve Description: Including pain rating scale, medication(s)/side effects and non-pharmacologic comfort measures Outcome: Not Progressing   Problem: Health Behavior/Discharge Planning: Goal: Ability to manage health-related needs will improve Outcome: Not Progressing   Problem: Clinical Measurements: Goal: Ability to maintain clinical measurements within normal limits will improve Outcome: Not Progressing Goal: Will remain free from infection Outcome: Not Progressing Goal: Diagnostic test results will improve Outcome: Not Progressing Goal: Respiratory complications will improve Outcome: Not Progressing Goal: Cardiovascular complication will be avoided Outcome: Not Progressing   Problem: Activity: Goal: Risk for activity intolerance will decrease Outcome: Not Progressing   Problem: Nutrition: Goal: Adequate nutrition will be maintained Outcome: Not Progressing   Problem: Coping: Goal: Level of anxiety will decrease Outcome: Not Progressing   Problem: Elimination: Goal: Will not experience complications related to bowel motility Outcome: Not Progressing Goal: Will not experience complications related to urinary retention Outcome: Not Progressing   Problem: Pain Managment: Goal: General experience of comfort will improve Outcome: Not Progressing   Problem: Safety: Goal: Ability to remain free from injury will improve Outcome: Not Progressing   Problem: Skin Integrity: Goal: Risk for impaired skin integrity will decrease Outcome: Not Progressing

## 2021-04-14 NOTE — ED Triage Notes (Signed)
Pt brought in by [facility] from [facility] with c/o new onset seizure. Pt has no hx of seizures. Pt's temp 103 per nursing staff at facility. Pt was seizing about 5 mins prior to EMS arrival. EMS gave Versed 2.5mg  IV and pt stopped seizing for about 1.5 minutes then started seizing again and another Versed 2.5mg  IV was given.

## 2021-04-14 NOTE — H&P (Signed)
History and Physical    [name]:[id number] DOB: 01-12-1933 DOA: 04/14/2021  PCP: Bonnita Nasuti, MD   Patient coming from: [facility]  I have personally briefly reviewed patient's old medical records in [place]  Chief Complaint: Shaking/seizure like activity  HPI: Rebecca Hardy is a 87 y.o. female with medical history significant for hypertension, CVA with left-sided weakness, dementia, diabetes mellitus.  Patient was brought to the ED from facility reports of possible seizure activity, patient's temperature was 103 per nursing staff.  Patient was seizing for about 5 minutes prior to EMS arrival, 2.5 mg of Versed was given patient stopped seizing for about 1 and half  minute minutes, and then repeat dose of 2.5 mg of Versed was given. EDP talked to nursing home staff, reported at about 10:30 AM patient was okay, and about noon they went to feed her and patient was shaking but still able to talk, and was oriented x1 this is her baseline.  Does reported that this has happened in the past, and was due to hypoglycemia.  Her blood sugars have been elevated.  Also reports wounds to her feet which were x-rayed and negative for osteomyelitis. On my evaluation patient is awake, alert, verbalizing able to tell me her name but not answer subsequent questions.  ED Course: Febrile to 101.7.  Tachycardic 156, respiratory rate 12-22.  Blood pressure systolic 99-833.  O2 sats greater than 98% on room air.  Lactic acid 3 > 2.  WBC 8.4.  UA with large leukocytes.  Head CT without acute abnormality.  Port chest x-ray unremarkable.  500 mils normal saline bolus given in route to ED, 1500 mils sepsis fluid bolus given in ED.  EDP talked to cardiologist on-call- Dr. Doree Albee, he also initial concern that this was atrial flutter.  And recommendation was to give IV metoprolol and if not effective start IV amiodarone.  But subsequent cardiology note in chart-Motion artifact in multiple  leads, cannot clearly see P waves or flutter waves.  Differential to include sinus tachycardia versus atrial flutter versus SVT.  Recommended repeating EKG.   IV vancomycin cefepime and metronidazole started.  Hospitalist to admit.  Review of Systems: Unable to assess due to baseline dementia.  Past Medical History:  Diagnosis Date   DCIS (ductal carcinoma in situ) of breast    left breast   Diabetes mellitus    GERD (gastroesophageal reflux disease)    High cholesterol    History of TIA (transient ischemic attack)    Hypertension    Stroke [facility])     Past Surgical History:  Procedure Laterality Date   ABDOMINAL HYSTERECTOMY     BREAST LUMPECTOMY     CESAREAN SECTION     LOOP RECORDER INSERTION N/A 08/23/2018   Procedure: LOOP RECORDER INSERTION;  Surgeon: Evans Lance, MD;  Location: [facility];  Service: Cardiovascular;  Laterality: N/A;   TEE WITHOUT CARDIOVERSION N/A 08/23/2018   Procedure: TRANSESOPHAGEAL ECHOCARDIOGRAM (TEE);  Surgeon: Dorothy Spark, MD;  Location: [facility];  Service: Cardiovascular;  Laterality: N/A;     reports that she has never smoked. She has never used smokeless tobacco. She reports that she does not drink alcohol and does not use drugs.  Allergies  Allergen Reactions   Codeine Other (See Comments)    Unknown reaction   Penicillins Other (See Comments)    Unknown reaction  Has patient had a PCN reaction causing immediate rash, facial/tongue/throat swelling, SOB  or lightheadedness with hypotension: Uknown Has patient had a PCN reaction causing severe rash involving mucus membranes or skin necrosis: Uknown Has patient had a PCN reaction that required hospitalization: Unknown Has patient had a PCN reaction occurring within the last 10 years: No If all of the above answers are "NO", then may proceed with Cephalosporin use.   Sulfa Antibiotics     Family History  Problem Relation Age of Onset   Cancer Mother    Hypertension  Father    Prior to Admission medications   Medication Sig Start Date End Date Taking? Authorizing Provider  aspirin 325 MG tablet Take 1 tablet (325 mg total) by mouth daily. Patient not taking: No sig reported 08/24/18   Samuella Cota, MD  Ergocalciferol (VITAMIN D2) 50 MCG (2000 UT) TABS Take 1 capsule by mouth daily.    [provider]  furosemide (LASIX) 40 MG tablet Take 0.5 tablets (20 mg total) by mouth daily. 11/26/20   Barton Dubois, MD  linagliptin (TRADJENTA) 5 MG TABS tablet Take 5 mg by mouth daily.    [provider]  loratadine (CLARITIN) 10 MG tablet Take 10 mg by mouth daily.    [provider]  LORazepam (ATIVAN) 0.5 MG tablet Take 0.5 mg by mouth daily as needed for anxiety.    [provider]  metoprolol succinate (TOPROL-XL) 25 MG 24 hr tablet Take 25 mg by mouth daily.    [provider]  rosuvastatin (CRESTOR) 20 MG tablet Take 20 mg by mouth daily.  07/16/18   [provider]  sertraline (ZOLOFT) 25 MG tablet Take 25 mg by mouth at bedtime.  03/27/15   [provider]  TOUJEO SOLOSTAR 300 UNIT/ML Solostar Pen Inject 25 Units into the skin daily. Prime pen with 3u prior to each use 11/22/20   Barton Dubois, MD    Physical Exam: Vitals:   04/14/21 1430 04/14/21 1510 04/14/21 1530 04/14/21 1600  BP: (!) 155/86 131/85 (!) 150/95 (!) 134/91  Pulse: (!) 153 (!) 154 (!) 156 (!) 151  Resp: (!) 22 19 12 13   Temp:      TempSrc:      SpO2: 92% 100% 98% 98%  Weight:      Height:        Constitutional: NAD, calm, comfortable Vitals:   04/14/21 1430 04/14/21 1510 04/14/21 1530 04/14/21 1600  BP: (!) 155/86 131/85 (!) 150/95 (!) 134/91  Pulse: (!) 153 (!) 154 (!) 156 (!) 151  Resp: (!) 22 19 12 13   Temp:      TempSrc:      SpO2: 92% 100% 98% 98%  Weight:      Height:       Eyes: PERRL, lids and conjunctivae normal ENMT: Mucous membranes are markedly dry  neck: normal, supple, no masses, no  thyromegaly Respiratory: clear to auscultation bilaterally, no wheezing, no crackles. Normal respiratory effort. No accessory muscle use.  Cardiovascular: Irregular rate and rhythm, no murmurs / rubs / gallops. No extremity edema. 2+ pedal pulses.  Abdomen: no tenderness, no masses palpated. No hepatosplenomegaly. Bowel sounds positive.  Musculoskeletal: no clubbing / cyanosis.  Obvious Contractures to left upper extremity.   Skin: no rashes, lesions, ulcers. No induration Neurologic: No facial asymmetry, verbalizing, left upper extremity flexion contracture to hand, reduced relative movement of left lower extremity compared to right. Psychiatric: Awake, alert and oriented x1 only..   Labs on Admission: I have personally reviewed following labs and imaging studies  CBC:  Recent Labs  Lab 04/14/21 1339  WBC 8.4  NEUTROABS 6.9  HGB 10.5*  HCT 32.8*  MCV 88.9  PLT 786   Basic Metabolic Panel: Recent Labs  Lab 04/14/21 1339  NA 138  K 4.3  CL 101  CO2 27  GLUCOSE 214*  BUN 15  CREATININE 1.10*  CALCIUM 8.3*   Liver Function Tests: Recent Labs  Lab 04/14/21 1339  AST 19  ALT 11  ALKPHOS 66  BILITOT 0.6  PROT 5.7*  ALBUMIN 2.7*   Coagulation Profile: Recent Labs  Lab 04/14/21 1339  INR 1.1   Urine analysis:    Component Value Date/Time   COLORURINE YELLOW 04/14/2021 1335   APPEARANCEUR TURBID (A) 04/14/2021 1335   LABSPEC 1.018 04/14/2021 1335   PHURINE 6.0 04/14/2021 1335   GLUCOSEU NEGATIVE 04/14/2021 1335   HGBUR NEGATIVE 04/14/2021 1335   BILIRUBINUR NEGATIVE 04/14/2021 1335   KETONESUR 5 (A) 04/14/2021 1335   PROTEINUR 100 (A) 04/14/2021 1335   UROBILINOGEN 0.2 04/29/2011 1847   NITRITE NEGATIVE 04/14/2021 1335   LEUKOCYTESUR LARGE (A) 04/14/2021 1335    Radiological Exams on Admission: CT Head Wo Contrast  Result Date: 04/14/2021 CLINICAL DATA:  Delirium.  New onset seizure.  Fever. EXAM: CT HEAD WITHOUT CONTRAST TECHNIQUE: Contiguous axial  images were obtained from the base of the skull through the vertex without intravenous contrast. COMPARISON:  10/03/2018 FINDINGS: Brain: The right MCA infarct on the prior CT has evolved, now with a large area of encephalomalacia in the temporal and parietal lobes with associated cortical mineralization and ex vacuo dilatation of the temporal horn of the right lateral ventricle. Small chronic left parietal and left temporal occipital infarcts are unchanged. There are new chronic infarcts involving the right frontal operculum (MCA territory) and right occipital and medial temporal lobes (PCA territory). Chronic lacunar infarcts are again noted in the deep cerebral white matter and cerebellum. No definite acute infarct, intracranial hemorrhage, mass, midline shift, or extra-axial fluid collection is identified. Vascular: Calcified atherosclerosis at the skull base. No hyperdense vessel. Skull: No fracture or suspicious osseous lesion. Sinuses/Orbits: Mild mucosal thickening in the paranasal sinuses. Small volume fluid in the left sphenoid sinus. Clear mastoid air cells. Bilateral cataract extraction. Other: None. IMPRESSION: 1. No evidence of acute intracranial abnormality. 2. Numerous chronic infarcts as above including interval right MCA and right PCA infarcts. Electronically Signed   By: Logan Bores M.D.   On: 04/14/2021 14:39   DG Chest Port 1 View  Result Date: 04/14/2021 CLINICAL DATA:  Questionable sepsis. EXAM: PORTABLE CHEST 1 VIEW COMPARISON:  October 03, 2018 FINDINGS: The heart size and mediastinal contours are within normal limits. Both lungs are clear. The visualized skeletal structures are unremarkable. IMPRESSION: No active disease. Electronically Signed   By: Dorise Bullion III M.D   On: 04/14/2021 13:48    EKG: Independently reviewed.  Second EKG shows heart rate of 149, read as SVT, no distinct P waves appreciated.  QTc 442.  Assessment/Plan Principal Problem:   Sepsis ([place]) Active  Problems:   Left-sided weakness   Essential hypertension   CKD (chronic kidney disease) stage 3, GFR 30-59 ml/min (HCC)   Type 2 diabetes mellitus with hyperlipidemia (HCC)   Dementia (HCC)    Severe Sepsis-likely urinary source-UA suggestive with large leukocytes..  Febrile to 101.7, tachycardic to 156, lactic acidosis of 3 > 2 with fluid bolus.  No leukocytosis.  Chest x-ray unremarkable.  COVID test negative. -Follow-up blood and urine culture -1.5 L  sepsis fluid bolus given, continue N/s 75cc/hr x 20hrs -Continue antibiotics with IV ceftriaxone  Atria Flutter with RVR-heart rate up to 156.  EKG showing SVT.  - ED provider talked to cardiologist on-call, there was a concern for atrial flutter, recommended IV metoprolol, if no improvement in heart rate start Iv amiodarone.  Per cardiology's note, recommended repeating EKG, differentials to include SVT, sinus tachycardia, atrial flutter.  No distinct P waves or flutter waves seen. -No improvement with IV metoprolol 5 mg x 2, heart rates in the 130s to 140s on my evaluation after metoprolol, hence IV amiodarone already started following initial cards recommendation.   -Check TSH, magnesium, troponin -Obtain echocardiogram -Home medications include metoprolol XL25 mg daily, will convert to twice daily dosing-for ease of dose adjustment and increased dose to 50 mg twice daily. -With advanced dementia, I have held off on starting anticoagulation, her Chadsvasc score is quite high at 7. - Cardiology consult  Shaking/possible seizure-like activity-patient febrile to 101.7, thought shaking episodes may be rigors.  As patient is awake and answering question during episodes in ED- witnessed by nurse but not EDP.  2.5 mg IV Versed given x2 by EMS.  Head CT unremarkable.  -Seizure precautions, -As needed Ativan -May need EEG, MRI brain  Prolonged QTC-542.  Normal potassium 4.3. -Check magnesium  CVA with Left sided weakness, dementia. -Resume  aspirin, statin  DM - Random glucose 214 - Hgba1c - SSI- S - Resume linagliptin, and home tujeo at reduced dose 18 ( from 25u) daily  HTN-systolic 19-147.  -Resume increased dose metoprolol- -Hold Lasix 20 mg daily with sepsis  CKD 3A- Cr 1.1 - Stable  DVT prophylaxis: Lovenox Code Status: Full code Family Communication: Called but I am unable to reach either daughter [name] or Ernestene Mention, listed on demographics-got voicemail, on both phones, did not leave message. Disposition Plan: >2 days Consults called: None Admission status: inpt, step down I certify that at the point of admission it is my clinical judgment that the patient will require inpatient hospital care spanning beyond 2 midnights from the point of admission due to high intensity of service, high risk for further deterioration and high frequency of surveillance required.     Bethena Roys MD [facility]  04/14/2021, 9:57 PM

## 2021-04-14 NOTE — Sepsis Progress Note (Signed)
Sepsis protocol is being followed by [facility]. 

## 2021-04-14 NOTE — Progress Notes (Signed)
Asked to review EKG by Rayna Sexton, PA.  Patient is 84F with dementia who presents with sepsis, likely urinary source.  Initial EKG shows sinus tachycardia, rate 113.  EKG at 14:54 shows regular narrow complex tachycardia, rate 149.  There is motion artifact in multiple leads, cannot clearly see P waves or flutter waves.  Differential includes sinus tach vs aflutter vs SVT.  Would repeat EKG. I am not able to review telemetry but per Rolla Plate went into rhythm suddenly and rate has been constant at 150; lack of gradual changes argues against sinus tach and suggests more likely aflutter or SVT, and rate constant at 150 suggests a 2:1 flutter, though I am not able to review telemetry myself.

## 2021-04-14 NOTE — ED Notes (Signed)
pts heart rate noted to be in the 150s. EKG done and MD made aware. pts sats are 100% on RA at this time. BP is 131/85. Pt is alert and talking.

## 2021-04-14 NOTE — Sepsis Progress Note (Deleted)
Notified provider of need to order repeat lactic acid since the 2nd LA was higher than the first.  °

## 2021-04-15 ENCOUNTER — Inpatient Hospital Stay (HOSPITAL_COMMUNITY): Payer: Medicare Other

## 2021-04-15 DIAGNOSIS — E785 Hyperlipidemia, unspecified: Secondary | ICD-10-CM

## 2021-04-15 DIAGNOSIS — E1169 Type 2 diabetes mellitus with other specified complication: Secondary | ICD-10-CM

## 2021-04-15 DIAGNOSIS — I4892 Unspecified atrial flutter: Secondary | ICD-10-CM

## 2021-04-15 DIAGNOSIS — N179 Acute kidney failure, unspecified: Secondary | ICD-10-CM

## 2021-04-15 DIAGNOSIS — I10 Essential (primary) hypertension: Secondary | ICD-10-CM

## 2021-04-15 DIAGNOSIS — R6520 Severe sepsis without septic shock: Secondary | ICD-10-CM

## 2021-04-15 LAB — GLUCOSE, CAPILLARY
Glucose-Capillary: 88 mg/dL (ref 70–99)
Glucose-Capillary: 102 mg/dL — ABNORMAL HIGH (ref 70–99)
Glucose-Capillary: 125 mg/dL — ABNORMAL HIGH (ref 70–99)
Glucose-Capillary: 118 mg/dL — ABNORMAL HIGH (ref 70–99)

## 2021-04-15 LAB — ECHOCARDIOGRAM COMPLETE
AR max vel: 2.05 cm2
AV Area VTI: 2.11 cm2
AV Area mean vel: 2.01 cm2
AV Mean grad: 3 mmHg
AV Peak grad: 6.6 mmHg
Ao pk vel: 1.28 m/s
Area-P 1/2: 2.95 cm2
Height: 66 in
MV VTI: 1.86 cm2
P 1/2 time: 589 msec
S' Lateral: 2.89 cm
Weight: 2172.85 oz

## 2021-04-15 LAB — BASIC METABOLIC PANEL
Anion gap: 7 (ref 5–15)
BUN: 13 mg/dL (ref 8–23)
CO2: 25 mmol/L (ref 22–32)
Calcium: 7.8 mg/dL — ABNORMAL LOW (ref 8.9–10.3)
Chloride: 104 mmol/L (ref 98–111)
Creatinine, Ser: 0.78 mg/dL (ref 0.44–1.00)
GFR, Estimated: >60 mL/min (ref >60)
Glucose, Bld: 106 mg/dL — ABNORMAL HIGH (ref 70–99)
Potassium: 3.2 mmol/L — ABNORMAL LOW (ref 3.5–5.1)
Sodium: 136 mmol/L (ref 135–145)

## 2021-04-15 LAB — CBC
HCT: 30.4 % — ABNORMAL LOW (ref 36.0–46.0)
Hemoglobin: 9.7 g/dL — ABNORMAL LOW (ref 12.0–15.0)
MCH: 28.4 pg (ref 26.0–34.0)
MCHC: 31.9 g/dL (ref 30.0–36.0)
MCV: 89.1 fL (ref 80.0–100.0)
Platelets: 251 10*3/uL (ref 150–400)
RBC: 3.41 MIL/uL — ABNORMAL LOW (ref 3.87–5.11)
RDW: 14.1 % (ref 11.5–15.5)
WBC: 8.3 10*3/uL (ref 4.0–10.5)
nRBC: 0 % (ref 0.0–0.2)

## 2021-04-15 MED ORDER — PANTOPRAZOLE SODIUM 40 MG PO TBEC
40.0000 mg | DELAYED_RELEASE_TABLET | Freq: Every day | ORAL | Status: DC
  Administered 2021-04-16: 40 mg via ORAL
  Filled 2021-04-15 (×2): qty 1

## 2021-04-15 MED ORDER — ADULT MULTIVITAMIN W/MINERALS CH
1.0000 | ORAL_TABLET | Freq: Every day | ORAL | Status: DC
  Administered 2021-04-16 – 2021-04-24 (×7): 1 via ORAL
  Filled 2021-04-15 (×7): qty 1

## 2021-04-15 MED ORDER — POTASSIUM CHLORIDE 10 MEQ/100ML IV SOLN
10.0000 meq | INTRAVENOUS | Status: AC
  Administered 2021-04-15 (×2): 10 meq via INTRAVENOUS
  Filled 2021-04-15: qty 100

## 2021-04-15 MED ORDER — POTASSIUM CHLORIDE CRYS ER 20 MEQ PO TBCR
40.0000 meq | EXTENDED_RELEASE_TABLET | Freq: Once | ORAL | Status: AC
  Administered 2021-04-15: 40 meq via ORAL
  Filled 2021-04-15: qty 2

## 2021-04-15 MED ORDER — PROSOURCE PLUS PO LIQD
30.0000 mL | Freq: Three times a day (TID) | ORAL | Status: DC
  Administered 2021-04-15 – 2021-04-20 (×5): 30 mL via ORAL
  Filled 2021-04-15 (×6): qty 30

## 2021-04-15 MED ORDER — SODIUM CHLORIDE 0.9 % IV BOLUS
500.0000 mL | Freq: Once | INTRAVENOUS | Status: AC
  Administered 2021-04-15: 500 mL via INTRAVENOUS

## 2021-04-15 MED ORDER — METOPROLOL TARTRATE 50 MG PO TABS
50.0000 mg | ORAL_TABLET | Freq: Two times a day (BID) | ORAL | Status: DC
  Administered 2021-04-15 – 2021-04-24 (×13): 50 mg via ORAL
  Filled 2021-04-15 (×13): qty 1

## 2021-04-15 NOTE — Progress Notes (Signed)
PROGRESS NOTE    Rebecca Hardy  XBJ:[id number] DOB: 01-Jun-1933 DOA: 04/14/2021 PCP: Bonnita Nasuti, MD    Chief Complaint  Patient presents with   Seizures    Brief admission Narrative:  Rebecca Hardy is a 87 y.o. female with medical history significant for hypertension, CVA with left-sided weakness, dementia, diabetes mellitus.  Patient was brought to the ED from facility reports of possible seizure activity, patient's temperature was 103 per nursing staff.  Patient was seizing for about 5 minutes prior to EMS arrival, 2.5 mg of Versed was given patient stopped seizing for about 1 and half  minute minutes, and then repeat dose of 2.5 mg of Versed was given. EDP talked to nursing home staff, reported at about 10:30 AM patient was okay, and about noon they went to feed her and patient was shaking but still able to talk, and was oriented x1 this is her baseline.  Does reported that this has happened in the past, and was due to hypoglycemia.  Her blood sugars have been elevated.  Also reports wounds to her feet which were x-rayed and negative for osteomyelitis. On my evaluation patient is awake, alert, verbalizing able to tell me her name but not answer subsequent questions.   ED Course: Febrile to 101.7.  Tachycardic 156, respiratory rate 12-22.  Blood pressure systolic 30-865.  O2 sats greater than 98% on room air.  Lactic acid 3 > 2.  WBC 8.4.  UA with large leukocytes.  Head CT without acute abnormality.  Port chest x-ray unremarkable.  500 mils normal saline bolus given in route to ED, 1500 mils sepsis fluid bolus given in ED.  EDP talked to cardiologist on-call- Dr. Doree Albee, he also initial concern that this was atrial flutter.  And recommendation was to give IV metoprolol and if not effective start IV amiodarone.  Assessment & Plan: 1-Severe sepsis ([employer]) -Patient met severe sepsis criteria on exam secondary to UTI; high-grade temperature, elevated heart rate, elevated lactic acid and  acute kidney injury. -COVID test negative -Continue fluid resuscitation -Follow resolution of sepsis features -Continue current antibiotic -Culture results  2-atrial flutter with RVR -Currently receiving amiodarone drip; will start adjusted dose of metoprolol and try to wean her off the drip. -Follow electrolytes and further replete as needed -Cardiology service has been consulted and will follow recommendations -2D echo has been ordered and pending Currently -No anticoagulation in the setting of dementia and high risk for falls. -CHADSVASC score >5 -Continue aspirin  3-history of stroke/TIA with eft-sided weakness -Continue aspirin and statin -No new focal deficits appreciated.  4-hypokalemia -Will replete and follow electrolytes trend.  5-Essential hypertension -Stable overall -Continue adjusted dose of metoprolol.  6-acute kidney injury on CKD (chronic kidney disease) stage 3a, GFR 30-59 ml/min (HCC) -In the setting of dehydration and UTI -Back to baseline after fluid resuscitation -Continue treatment with antibiotic -Follow renal function trend -Minimize using nephrotoxic agents, hypotension and the use of contrast.  7-seizure like activity -Probably in the setting of high-grade temperature -Seizure activity appreciated -Continue seizure precaution. -If needed will pursue MRI and EEG.  8-Type 2 diabetes mellitus with hyperlipidemia (HCC) -Continue sliding scale insulin. -Follow CBGs and further adjust hypoglycemic regimen as  9-Dementia (HCC) -Continue constant reorientation supportive care. -No behavioral disturbances at this time  10-gastroesophageal reflux disease/GI prophylaxis -Continue PPI.  DVT prophylaxis: Lovenox Code Status: Full code Family Communication: No family at bedside during today's visit. Disposition:   Status is: Inpatient  Remains inpatient appropriate because:IV  treatments appropriate due to intensity of illness or inability to take  PO  Dispo: The patient is from: [facility]              Anticipated d/c is to: Discharge back to facility              Patient currently is not medically stable to d/c.   Difficult to place patient No       Consultants:  Cardiology service Palliative care service  Procedures:  See below for x-ray report 2D echo:  Antimicrobials: Ceftriaxone   Subjective: Currently afebrile, feeling better, oriented to person and able to follow simple commands.  No nausea, no vomiting, no chest pain.  Objective: Vitals:   04/15/21 1100 04/15/21 1159 04/15/21 1200 04/15/21 1300  BP: 132/68  (!) 131/45 (!) 114/48  Pulse: 83 73 72 69  Resp: (!) 27 (!) _0 Temp:  (!) 97.5 F (36.4 C)    TempSrc:  Axillary    SpO2: 100% 100% 100% 100%  Weight:      Height:        Intake/Output Summary (Last 24 hours) at 04/15/2021 1733 Last data filed at 04/15/2021 1151 Gross per 24 hour  Intake 3220.05 ml  Output 0 ml  Net 3220.05 ml   Filed Weights   04/14/21 1306 04/14/21 1816  Weight: 74 kg 61.6 kg    Examination:  General exam: In no major distress; no nausea or vomiting.  Following simple commands. Respiratory system: Good air movement bilaterally; no using accessory muscle.  Good saturation. Cardiovascular system: Rate controlled currently; no rubs or gallops on exam. Gastrointestinal system: Abdomen is nondistended, soft and nontender. No organomegaly or masses felt. Normal bowel sounds heard. Central nervous system: No new focal neurological deficits. Extremities: No cyanosis or clubbing. Skin: No petechiae. Psychiatry: Mood & affect appropriate.     Data Reviewed: I have personally reviewed following labs and imaging studies  CBC: Recent Labs  Lab 04/14/21 1339 04/15/21 0438  WBC 8.4 8.3  NEUTROABS 6.9  --   HGB 10.5* 9.7*  HCT 32.8* 30.4*  MCV 88.9 89.1  PLT 295 093    Basic Metabolic Panel: Recent Labs  Lab 04/14/21 1339 04/14/21 1914  04/15/21 0438  NA 138  --  136  K 4.3  --  3.2*  CL 101  --  104  CO2 27  --  25  GLUCOSE 214*  --  106*  BUN 15  --  13  CREATININE 1.10*  --  0.78  CALCIUM 8.3*  --  7.8*  MG  --  1.7  --     GFR: Estimated Creatinine Clearance: 48.1 mL/min (by C-G formula based on SCr of 0.78 mg/dL).  Liver Function Tests: Recent Labs  Lab 04/14/21 1339  AST 19  ALT 11  ALKPHOS 66  BILITOT 0.6  PROT 5.7*  ALBUMIN 2.7*    CBG: Recent Labs  Lab 04/14/21 2138 04/15/21 0725 04/15/21 1158 04/15/21 1713  GLUCAP 203* 88 102* 125*     Recent Results (from the past 240 hour(s))  Blood Culture (routine x 2)     Status: None (Preliminary result)   Collection Time: 04/14/21  1:39 PM   Specimen: BLOOD LEFT HAND  Result Value Ref Range Status   Specimen Description   Final    BLOOD LEFT HAND BOTTLES DRAWN AEROBIC AND ANAEROBIC   Special Requests Blood Culture adequate volume  Final   Culture   Final  NO GROWTH < 24 HOURS Performed at [facility], [address] [place], [place] [address]    Report Status PENDING  Incomplete  Blood Culture (routine x 2)     Status: None (Preliminary result)   Collection Time: 04/14/21  1:39 PM   Specimen: BLOOD RIGHT FOREARM  Result Value Ref Range Status   Specimen Description   Final    BLOOD RIGHT FOREARM BOTTLES DRAWN AEROBIC AND ANAEROBIC   Special Requests Blood Culture adequate volume  Final   Culture   Final    NO GROWTH < 24 HOURS Performed at [facility], [address] [place], [place] [address]    Report Status PENDING  Incomplete  Resp Panel by RT-PCR (Flu A&B, Covid) Nasopharyngeal Swab     Status: None   Collection Time: 04/14/21  1:54 PM   Specimen: Nasopharyngeal Swab; Nasopharyngeal(NP) swabs in vial transport medium  Result Value Ref Range Status   SARS Coronavirus 2 by RT PCR NEGATIVE NEGATIVE Final    Comment: (NOTE) SARS-CoV-2 target nucleic acids are NOT DETECTED.  The SARS-CoV-2 RNA is generally detectable in  upper respiratory specimens during the acute phase of infection. The lowest concentration of SARS-CoV-2 viral copies this assay can detect is 138 copies/mL. A negative result does not preclude SARS-Cov-2 infection and should not be used as the sole basis for treatment or other patient management decisions. A negative result may occur with  improper specimen collection/handling, submission of specimen other than nasopharyngeal swab, presence of viral mutation(s) within the areas targeted by this assay, and inadequate number of viral copies(<138 copies/mL). A negative result must be combined with clinical observations, patient history, and epidemiological information. The expected result is Negative.  Fact Sheet for Patients:  EntrepreneurPulse.com.au  Fact Sheet for Healthcare Providers:  IncredibleEmployment.be  This test is no t yet approved or cleared by the [place] FDA and  has been authorized for detection and/or diagnosis of SARS-CoV-2 by FDA under an Emergency Use Authorization (EUA). This EUA will remain  in effect (meaning this test can be used) for the duration of the COVID-19 declaration under Section 564(b)(1) of the Act, 21 U.S.C.section 360bbb-3(b)(1), unless the authorization is terminated  or revoked sooner.       Influenza A by PCR NEGATIVE NEGATIVE Final   Influenza B by PCR NEGATIVE NEGATIVE Final    Comment: (NOTE) The Xpert Xpress SARS-CoV-2/FLU/RSV plus assay is intended as an aid in the diagnosis of influenza from Nasopharyngeal swab specimens and should not be used as a sole basis for treatment. Nasal washings and aspirates are unacceptable for Xpert Xpress SARS-CoV-2/FLU/RSV testing.  Fact Sheet for Patients: EntrepreneurPulse.com.au  Fact Sheet for Healthcare Providers: IncredibleEmployment.be  This test is not yet approved or cleared by the [place] FDA and has been  authorized for detection and/or diagnosis of SARS-CoV-2 by FDA under an Emergency Use Authorization (EUA). This EUA will remain in effect (meaning this test can be used) for the duration of the COVID-19 declaration under Section 564(b)(1) of the Act, 21 U.S.C. section 360bbb-3(b)(1), unless the authorization is terminated or revoked.  Performed at [facility], [address] [place], [place] [address]   MRSA Next Gen by PCR, Nasal     Status: None   Collection Time: 04/14/21  6:15 PM   Specimen: Nasal Mucosa; Nasal Swab  Result Value Ref Range Status   MRSA by PCR Next Gen NOT DETECTED NOT DETECTED Final    Comment: (NOTE) The GeneXpert MRSA Assay (FDA  approved for NASAL specimens only), is one component of a comprehensive MRSA colonization surveillance program. It is not intended to diagnose MRSA infection nor to guide or monitor treatment for MRSA infections. Test performance is not FDA approved in patients less than 71 years old. Performed at [facility], [address] [place], [place] [address]      Radiology Studies: CT Head Wo Contrast  Result Date: 04/14/2021 CLINICAL DATA:  Delirium.  New onset seizure.  Fever. EXAM: CT HEAD WITHOUT CONTRAST TECHNIQUE: Contiguous axial images were obtained from the base of the skull through the vertex without intravenous contrast. COMPARISON:  10/03/2018 FINDINGS: Brain: The right MCA infarct on the prior CT has evolved, now with a large area of encephalomalacia in the temporal and parietal lobes with associated cortical mineralization and ex vacuo dilatation of the temporal horn of the right lateral ventricle. Small chronic left parietal and left temporal occipital infarcts are unchanged. There are new chronic infarcts involving the right frontal operculum (MCA territory) and right occipital and medial temporal lobes (PCA territory). Chronic lacunar infarcts are again noted in the deep cerebral white matter and cerebellum. No definite acute  infarct, intracranial hemorrhage, mass, midline shift, or extra-axial fluid collection is identified. Vascular: Calcified atherosclerosis at the skull base. No hyperdense vessel. Skull: No fracture or suspicious osseous lesion. Sinuses/Orbits: Mild mucosal thickening in the paranasal sinuses. Small volume fluid in the left sphenoid sinus. Clear mastoid air cells. Bilateral cataract extraction. Other: None. IMPRESSION: 1. No evidence of acute intracranial abnormality. 2. Numerous chronic infarcts as above including interval right MCA and right PCA infarcts. Electronically Signed   By: Logan Bores M.D.   On: 04/14/2021 14:39   DG Chest Port 1 View  Result Date: 04/14/2021 CLINICAL DATA:  Questionable sepsis. EXAM: PORTABLE CHEST 1 VIEW COMPARISON:  October 03, 2018 FINDINGS: The heart size and mediastinal contours are within normal limits. Both lungs are clear. The visualized skeletal structures are unremarkable. IMPRESSION: No active disease. Electronically Signed   By: Dorise Bullion III M.D   On: 04/14/2021 13:48   ECHOCARDIOGRAM COMPLETE  Result Date: 04/15/2021    ECHOCARDIOGRAM REPORT   Patient Name:   BERGEN MELLE Date of Exam: 04/15/2021 Medical Rec #:  [id number]        Height:       66.0 in Accession #:    [id number]       Weight:       135.8 lb Date of Birth:  [birth year]         BSA:          1.696 m Patient Age:    37 years         BP:           127/37 mmHg Patient Gender: F                HR:           67 bpm. Exam Location:  [facility] Procedure: 2D Echo, Cardiac Doppler and Color Doppler Indications:    Atrial Flutter I48.92  History:        Patient has prior history of Echocardiogram examinations, most                 recent 08/19/2018. Stroke, Arrythmias:Atrial Flutter; Risk                 Factors:Hypertension, Diabetes and Dyslipidemia. COVID +  11/20/2020.  Sonographer:    Wenda Low Referring Phys: [place]  1. Left ventricular ejection  fraction, by estimation, is 55 to 60%. The left ventricle has normal function. The left ventricle has no regional wall motion abnormalities. Left ventricular diastolic parameters were normal.  2. Right ventricular systolic function is normal. The right ventricular size is normal. There is normal pulmonary artery systolic pressure. The estimated right ventricular systolic pressure is 46.5 mmHg.  3. The mitral valve is abnormal. Mild mitral valve regurgitation. Moderate mitral annular calcification.  4. The aortic valve is tricuspid. Aortic valve regurgitation is mild. Mild to moderate aortic valve sclerosis/calcification is present, without any evidence of aortic stenosis. Aortic regurgitation PHT measures 589 msec. Aortic valve mean gradient measures 3.0 mmHg.  5. The inferior vena cava is normal in size with greater than 50% respiratory variability, suggesting right atrial pressure of 3 mmHg. FINDINGS  Left Ventricle: Left ventricular ejection fraction, by estimation, is 55 to 60%. The left ventricle has normal function. The left ventricle has no regional wall motion abnormalities. The left ventricular internal cavity size was normal in size. There is  no left ventricular hypertrophy. Left ventricular diastolic parameters were normal. Right Ventricle: The right ventricular size is normal. No increase in right ventricular wall thickness. Right ventricular systolic function is normal. There is normal pulmonary artery systolic pressure. The tricuspid regurgitant velocity is 2.38 m/s, and  with an assumed right atrial pressure of 3 mmHg, the estimated right ventricular systolic pressure is 68.1 mmHg. Left Atrium: Left atrial size was normal in size. Right Atrium: Right atrial size was normal in size. Pericardium: There is no evidence of pericardial effusion. Mitral Valve: The mitral valve is abnormal. Moderate mitral annular calcification. Mild mitral valve regurgitation. MV peak gradient, 6.6 mmHg. The mean mitral  valve gradient is 3.0 mmHg. Tricuspid Valve: The tricuspid valve is grossly normal. Tricuspid valve regurgitation is trivial. Aortic Valve: The aortic valve is tricuspid. There is mild to moderate aortic valve annular calcification. Aortic valve regurgitation is mild. Aortic regurgitation PHT measures 589 msec. Mild to moderate aortic valve sclerosis/calcification is present, without any evidence of aortic stenosis. Aortic valve mean gradient measures 3.0 mmHg. Aortic valve peak gradient measures 6.6 mmHg. Aortic valve area, by VTI measures 2.11 cm. Pulmonic Valve: The pulmonic valve was grossly normal. Pulmonic valve regurgitation is trivial. Aorta: The aortic root is normal in size and structure. Venous: The inferior vena cava is normal in size with greater than 50% respiratory variability, suggesting right atrial pressure of 3 mmHg. IAS/Shunts: No atrial level shunt detected by color flow Doppler.  LEFT VENTRICLE PLAX 2D LVIDd:         4.08 cm  Diastology LVIDs:         2.89 cm  LV e' medial:    9.81 cm/s LV PW:         0.96 cm  LV E/e' medial:  11.4 LV IVS:        0.93 cm  LV e' lateral:   11.40 cm/s LVOT diam:     2.00 cm  LV E/e' lateral: 9.8 LV SV:         71 LV SV Index:   42 LVOT Area:     3.14 cm  RIGHT VENTRICLE RV Basal diam:  3.25 cm RV Mid diam:    2.59 cm RV S prime:     13.20 cm/s TAPSE (M-mode): 2.6 cm LEFT ATRIUM  Index       RIGHT ATRIUM          Index LA diam:        3.40 cm 2.00 cm/m  RA Area:     9.94 cm LA Vol (A2C):   38.8 ml 22.87 ml/m RA Volume:   18.80 ml 11.08 ml/m LA Vol (A4C):   38.1 ml 22.46 ml/m LA Biplane Vol: 38.4 ml 22.64 ml/m  AORTIC VALVE AV Area (Vmax):    2.05 cm AV Area (Vmean):   2.01 cm AV Area (VTI):     2.11 cm AV Vmax:           128.00 cm/s AV Vmean:          86.100 cm/s AV VTI:            0.336 m AV Peak Grad:      6.6 mmHg AV Mean Grad:      3.0 mmHg LVOT Vmax:         83.60 cm/s LVOT Vmean:        55.000 cm/s LVOT VTI:          0.226 m LVOT/AV VTI  ratio: 0.67 AI PHT:            589 msec  AORTA Ao Root diam: 3.00 cm Ao Asc diam:  2.90 cm MITRAL VALVE                TRICUSPID VALVE MV Area (PHT): 2.95 cm     TR Peak grad:   22.7 mmHg MV Area VTI:   1.86 cm     TR Vmax:        238.00 cm/s MV Peak grad:  6.6 mmHg MV Mean grad:  3.0 mmHg     SHUNTS MV Vmax:       1.28 m/s     Systemic VTI:  0.23 m MV Vmean:      82.3 cm/s    Systemic Diam: 2.00 cm MV Decel Time: 257 msec MV E velocity: 112.00 cm/s MV A velocity: 115.00 cm/s MV E/A ratio:  0.97 Rozann Lesches MD Electronically signed by Rozann Lesches MD Signature Date/Time: 04/15/2021/10:41:34 AM    Final      Scheduled Meds:  (feeding supplement) PROSource Plus  30 mL Oral TID BM   chlorhexidine  15 mL Mouth Rinse BID   Chlorhexidine Gluconate Cloth  6 each Topical Daily   enoxaparin (LOVENOX) injection  40 mg Subcutaneous Q24H   feeding supplement  237 mL Oral BID BM   insulin aspart  0-5 Units Subcutaneous QHS   insulin aspart  0-9 Units Subcutaneous TID WC   mouth rinse  15 mL Mouth Rinse q12n4p   multivitamin with minerals  1 tablet Oral Daily   Continuous Infusions:  amiodarone 30 mg/hr (04/15/21 1151)   cefTRIAXone (ROCEPHIN)  IV Stopped (04/15/21 0252)     LOS: 1 day    Time spent: 35 minutes   Barton Dubois, MD [facility]   To contact the attending provider between 7A-7P or the covering provider during after hours 7P-7A, please log into the web site [email or web address] and access using universal [place] password for that web site. If you do not have the password, please call the hospital operator.  04/15/2021, 5:33 PM

## 2021-04-15 NOTE — Progress Notes (Signed)
Initial Nutrition Assessment  DOCUMENTATION CODES:      INTERVENTION:  Ensure Enlive po BID, each supplement provides 350 kcal and 20 grams of protein   ProSource 30 ml TID (each 30 ml provides 100 kcal, 15 gr protein)   Recommend consider liberalized diet due to limited po intake and increased needs.   NUTRITION DIAGNOSIS:   Increased nutrient needs related to wound healing as evidenced by estimated needs.   GOAL:  Patient will meet greater than or equal to 90% of their needs   MONITOR:  PO intake, Supplement acceptance, Labs, Skin, Weight trends  REASON FOR ASSESSMENT:   Malnutrition Screening Tool    ASSESSMENT: Patient is an 87 yo female with dementia, DM, GERD, Stroke (residual left sided weakness) and DCIS (left breast). She presents with shaking/seizure like activity, febrile. Severe sepsis likely due to UTI per MD. Increased heart rate.   Patient meal intake poor fair per report. She likes chicken prepared a variety of ways and likes eggs for breakfast. She drinks Ensure at times. Talked with nursing. Patient needs assistance with meals to maximize intake.   Weight- 61.6 kg currently. In February pt weighed 74 kg. Significant loss 12 kg/ (17%) in < 6 months based on documentation.   Medications: Insulin   Labs: Potassium 3.2 (L) -replete BMP Latest Ref Rng & Units 04/15/2021 04/14/2021 11/22/2020  Glucose 70 - 99 mg/dL 106(H) 214(H) 88  BUN 8 - 23 mg/dL 13 15 45(H)  Creatinine 0.44 - 1.00 mg/dL 0.78 1.10(H) 1.49(H)  Sodium 135 - 145 mmol/L 136 138 139  Potassium 3.5 - 5.1 mmol/L 3.2(L) 4.3 4.0  Chloride 98 - 111 mmol/L 104 101 113(H)  CO2 22 - 32 mmol/L 25 27 21(L)  Calcium 8.9 - 10.3 mg/dL 7.8(L) 8.3(L) 8.4(L)     NUTRITION - FOCUSED PHYSICAL EXAM:  Nutrition-Focused physical exam completed. Findings are no fat depletion, mild temporal muscle depletion, and mild edema.    Diet Order:   Diet Order             Diet heart healthy/carb modified Room service  appropriate? Yes; Fluid consistency: Thin  Diet effective now                   EDUCATION NEEDS:  Not appropriate for education at this time  Skin:  Skin Assessment: Skin Integrity Issues: Skin Integrity Issues:: Unstageable Unstageable: 3 UNstageable wounds (wrist, 2 toes) and a PI- right heel  Last BM:  6/27 large type 6  Height:   Ht Readings from Last 1 Encounters:  04/14/21 5\' 6"  (1.676 m)    Weight:   Wt Readings from Last 1 Encounters:  04/14/21 61.6 kg    Ideal Body Weight:   59 kg  BMI:  Body mass index is 21.92 kg/m.  Estimated Nutritional Needs:   Kcal:  1800-1900  Protein:  90-95 gr  Fluid:  1.8-1.9 liters daily  Colman Cater MS,RD,CSG,LDN Contact: [email or web address]

## 2021-04-15 NOTE — Progress Notes (Signed)
  Echocardiogram 2D Echocardiogram has been performed.  Rebecca Hardy 04/15/2021, 9:55 AM

## 2021-04-16 ENCOUNTER — Encounter (HOSPITAL_COMMUNITY): Payer: Self-pay | Admitting: Internal Medicine

## 2021-04-16 DIAGNOSIS — Z515 Encounter for palliative care: Secondary | ICD-10-CM

## 2021-04-16 DIAGNOSIS — Z7189 Other specified counseling: Secondary | ICD-10-CM

## 2021-04-16 LAB — BASIC METABOLIC PANEL
Anion gap: 7 (ref 5–15)
BUN: 12 mg/dL (ref 8–23)
CO2: 24 mmol/L (ref 22–32)
Calcium: 8 mg/dL — ABNORMAL LOW (ref 8.9–10.3)
Chloride: 104 mmol/L (ref 98–111)
Creatinine, Ser: 0.77 mg/dL (ref 0.44–1.00)
GFR, Estimated: >60 mL/min (ref >60)
Glucose, Bld: 111 mg/dL — ABNORMAL HIGH (ref 70–99)
Potassium: 3.3 mmol/L — ABNORMAL LOW (ref 3.5–5.1)
Sodium: 135 mmol/L (ref 135–145)

## 2021-04-16 LAB — MAGNESIUM
Magnesium: 1.5 mg/dL — ABNORMAL LOW (ref 1.7–2.4)
Magnesium: 1.8 mg/dL (ref 1.7–2.4)

## 2021-04-16 LAB — CBC
HCT: 31.3 % — ABNORMAL LOW (ref 36.0–46.0)
Hemoglobin: 9.8 g/dL — ABNORMAL LOW (ref 12.0–15.0)
MCH: 28.3 pg (ref 26.0–34.0)
MCHC: 31.3 g/dL (ref 30.0–36.0)
MCV: 90.5 fL (ref 80.0–100.0)
Platelets: 279 10*3/uL (ref 150–400)
RBC: 3.46 MIL/uL — ABNORMAL LOW (ref 3.87–5.11)
RDW: 14.2 % (ref 11.5–15.5)
WBC: 8.3 10*3/uL (ref 4.0–10.5)
nRBC: 0 % (ref 0.0–0.2)

## 2021-04-16 LAB — VITAMIN B12: Vitamin B-12: 145 pg/mL — ABNORMAL LOW (ref 180–914)

## 2021-04-16 LAB — COMPREHENSIVE METABOLIC PANEL
ALT: 9 U/L (ref 0–44)
AST: 13 U/L — ABNORMAL LOW (ref 15–41)
Albumin: 2.3 g/dL — ABNORMAL LOW (ref 3.5–5.0)
Alkaline Phosphatase: 52 U/L (ref 38–126)
Anion gap: 5 (ref 5–15)
BUN: 12 mg/dL (ref 8–23)
CO2: 24 mmol/L (ref 22–32)
Calcium: 7.9 mg/dL — ABNORMAL LOW (ref 8.9–10.3)
Chloride: 106 mmol/L (ref 98–111)
Creatinine, Ser: 0.77 mg/dL (ref 0.44–1.00)
GFR, Estimated: >60 mL/min (ref >60)
Glucose, Bld: 118 mg/dL — ABNORMAL HIGH (ref 70–99)
Potassium: 4.3 mmol/L (ref 3.5–5.1)
Sodium: 135 mmol/L (ref 135–145)
Total Bilirubin: 0.4 mg/dL (ref 0.3–1.2)
Total Protein: 5 g/dL — ABNORMAL LOW (ref 6.5–8.1)

## 2021-04-16 LAB — GLUCOSE, CAPILLARY
Glucose-Capillary: 98 mg/dL (ref 70–99)
Glucose-Capillary: 127 mg/dL — ABNORMAL HIGH (ref 70–99)
Glucose-Capillary: 151 mg/dL — ABNORMAL HIGH (ref 70–99)
Glucose-Capillary: 98 mg/dL (ref 70–99)

## 2021-04-16 LAB — HEMOGLOBIN A1C
Hgb A1c MFr Bld: 7.1 % — ABNORMAL HIGH (ref 4.8–5.6)
Mean Plasma Glucose: 157 mg/dL

## 2021-04-16 LAB — FOLATE: Folate: 8.2 ng/mL (ref >5.9)

## 2021-04-16 MED ORDER — SODIUM CHLORIDE 0.9 % IV SOLN: INTRAVENOUS | Status: AC

## 2021-04-16 MED ORDER — POTASSIUM CHLORIDE CRYS ER 20 MEQ PO TBCR
40.0000 meq | EXTENDED_RELEASE_TABLET | ORAL | Status: DC
  Administered 2021-04-16: 40 meq via ORAL
  Filled 2021-04-16: qty 2

## 2021-04-16 MED ORDER — VITAMIN B-12 1000 MCG PO TABS
500.0000 ug | ORAL_TABLET | Freq: Every day | ORAL | Status: DC
  Administered 2021-04-19 – 2021-04-24 (×6): 500 ug via ORAL
  Filled 2021-04-16 (×6): qty 1

## 2021-04-16 MED ORDER — POTASSIUM CHLORIDE 20 MEQ PO PACK
40.0000 meq | PACK | ORAL | Status: AC
  Administered 2021-04-16 (×2): 40 meq via ORAL
  Filled 2021-04-16 (×2): qty 2

## 2021-04-16 MED ORDER — SODIUM CHLORIDE 0.9 % IV BOLUS
500.0000 mL | Freq: Once | INTRAVENOUS | Status: AC
  Administered 2021-04-16: 500 mL via INTRAVENOUS

## 2021-04-16 MED ORDER — ALBUMIN HUMAN 25 % IV SOLN
12.5000 g | Freq: Once | INTRAVENOUS | Status: AC
  Administered 2021-04-16: 12.5 g via INTRAVENOUS
  Filled 2021-04-16: qty 50

## 2021-04-16 MED ORDER — SODIUM CHLORIDE 0.9 % IV SOLN
2.0000 g | INTRAVENOUS | Status: DC
  Administered 2021-04-17 – 2021-04-18 (×2): 2 g via INTRAVENOUS
  Filled 2021-04-16 (×2): qty 20

## 2021-04-16 MED ORDER — ATORVASTATIN CALCIUM 20 MG PO TABS
20.0000 mg | ORAL_TABLET | Freq: Every day | ORAL | Status: DC
  Administered 2021-04-19 – 2021-04-24 (×6): 20 mg via ORAL
  Filled 2021-04-16 (×6): qty 1

## 2021-04-16 MED ORDER — MAGNESIUM SULFATE 2 GM/50ML IV SOLN
2.0000 g | Freq: Once | INTRAVENOUS | Status: AC
  Administered 2021-04-16: 2 g via INTRAVENOUS
  Filled 2021-04-16: qty 50

## 2021-04-16 MED ORDER — POTASSIUM CHLORIDE 10 MEQ/100ML IV SOLN
10.0000 meq | Freq: Once | INTRAVENOUS | Status: AC
  Administered 2021-04-16: 10 meq via INTRAVENOUS

## 2021-04-16 MED ORDER — LEVETIRACETAM IN NACL 1000 MG/100ML IV SOLN
1000.0000 mg | Freq: Once | INTRAVENOUS | Status: AC
  Administered 2021-04-16: 1000 mg via INTRAVENOUS
  Filled 2021-04-16: qty 100

## 2021-04-16 MED ORDER — LEVETIRACETAM IN NACL 500 MG/100ML IV SOLN
500.0000 mg | INTRAVENOUS | Status: DC
  Administered 2021-04-17: 500 mg via INTRAVENOUS
  Filled 2021-04-16: qty 100

## 2021-04-16 NOTE — Progress Notes (Signed)
Pt lethargic from administration of ativan but arousable. Pt now having a twitching motion of the left side of face but no jerking motion of head. Bedside report given with Alycia Patten, RN.

## 2021-04-16 NOTE — Progress Notes (Signed)
PROGRESS NOTE    Rebecca Hardy  OFB:[id number] DOB: [birth year] DOA: 04/14/2021 PCP: Bonnita Nasuti, MD    Chief Complaint  Patient presents with   Seizures    Brief admission Narrative:  Rebecca Hardy is a 87 y.o. female with medical history significant for hypertension, CVA with left-sided weakness, dementia, diabetes mellitus.  Patient was brought to the ED from facility reports of possible seizure activity, patient's temperature was 103 per nursing staff.  Patient was seizing for about 5 minutes prior to EMS arrival, 2.5 mg of Versed was given patient stopped seizing for about 1 and half  minute minutes, and then repeat dose of 2.5 mg of Versed was given. EDP talked to nursing home staff, reported at about 10:30 AM patient was okay, and about noon they went to feed her and patient was shaking but still able to talk, and was oriented x1 this is her baseline.  Does reported that this has happened in the past, and was due to hypoglycemia.  Her blood sugars have been elevated.  Also reports wounds to her feet which were x-rayed and negative for osteomyelitis. On my evaluation patient is awake, alert, verbalizing able to tell me her name but not answer subsequent questions.   ED Course: Febrile to 101.7.  Tachycardic 156, respiratory rate 12-22.  Blood pressure systolic 78-242.  O2 sats greater than 98% on room air.  Lactic acid 3 > 2.  WBC 8.4.  UA with large leukocytes.  Head CT without acute abnormality.  Port chest x-ray unremarkable.  500 mils normal saline bolus given in route to ED, 1500 mils sepsis fluid bolus given in ED.  EDP talked to cardiologist on-call- Dr. Doree Albee, he also initial concern that this was atrial flutter.  And recommendation was to give IV metoprolol and if not effective start IV amiodarone.  Assessment & Plan: 1-Severe sepsis due to Proteus Mirabilis UTI-- -Patient met severe sepsis criteria on exam secondary to UTI; high-grade temperature, elevated heart rate,  elevated lactic acid and acute kidney injury. -Urine Cx from 04/14/21  with Proteus Mirabilis  Blood Cx from 04/14/21 with GPR -COVID test negative -c/n iv Rocephin 2 gm daily  2-Atrial flutter with RVR -wean off amiodarone iv drip  C/n metoprolol 50 mg bid -Currently receiving amiodarone drip; will start adjusted dose of metoprolol and try to wean her off the drip. -Cardiology service has been consulted and will follow recommendations -2D echo with EF of 55 to 60 % -No anticoagulation in the setting of advanced dementia and high risk for falls. -CHADSVASC score >5 -Continue aspirin  3-history of stroke/TIA with Left-sided weakness -CT head shows old  right MCA and right PCA infarcts. -Continue aspirin and and lipitor -No additional new focal deficits appreciated.  4-hypokalemia/Hypomagnesemia--- replace and recheck  5-Essential hypertension -Stable overall -Continue adjusted dose of metoprolol.  6-acute kidney injury on CKD (chronic kidney disease) stage 3a, GFR 30-59 ml/min (HCC) -In the setting of dehydration and UTI -Back to baseline after fluid resuscitation -Continue treatment with antibiotic -Follow renal function trend -Minimize using nephrotoxic agents, hypotension and the use of contrast.  7-???seizure like activity--Tic like movement -Probably in the setting of high-grade temperature -Continue seizure precaution. -check  EEG.  8-Type 2 diabetes mellitus with hyperlipidemia (HCC) -Continue sliding scale insulin. -Follow CBGs and further adjust hypoglycemic regimen as  9-Dementia (HCC) -Continue constant reorientation supportive care. -No behavioral disturbances at this time  10)Gastroesophageal reflux disease/GI prophylaxis -Continue PPI.  11)Chronic Anemia-- Hgb is  9.8 which is close to baseline, no bleeding concerns   12) social/ethics--- palliative care consult appreciated, remains a full code  DVT prophylaxis: Lovenox Code Status: Full code Family  Communication: No family at bedside during today's visit. Disposition:   Status is: Inpatient  Remains inpatient appropriate because:IV treatments appropriate due to intensity of illness or inability to take PO  Dispo: The patient is from: [facility]              Anticipated d/c is to: Discharge back to facility              Patient currently is not medically stable to d/c.   Difficult to place patient No       Consultants:  Cardiology service Palliative care service  Procedures:  See below for x-ray report 2D echo:  Antimicrobials: Ceftriaxone   Subjective: No fever  Or chills  No Nausea, Vomiting or Diarrhea No shob  Objective: Vitals:   04/16/21 1400 04/16/21 1500 04/16/21 1600 04/16/21 1653  BP: (!) 123/38 (!) 113/32 (!) 111/52   Pulse: 61 (!) 58 62 68  Resp: '12 13 12 14  ' Temp:    98.4 F (36.9 C)  TempSrc:    Axillary  SpO2: 99% 99% 100% 100%  Weight:      Height:        Intake/Output Summary (Last 24 hours) at 04/16/2021 1654 Last data filed at 04/16/2021 1008 Gross per 24 hour  Intake 443.56 ml  Output 300 ml  Net 143.56 ml   Filed Weights   04/14/21 1306 04/14/21 1816  Weight: 74 kg 61.6 kg   Examination:  Physical Exam Gen:- Awake Alert,  in no apparent distress  HEENT:- [place].AT, No sclera icterus Neck-Supple Neck,No JVD,.  Lungs-  mostly clear, fair air movement CV- S1, S2 normal Abd-  +ve B.Sounds, Abd Soft, No tenderness,    Extremity/Skin:- No  edema,   +pulses  Psych-affect is appropriate, oriented x3 Neuro-Lt UE weakness with contracture, no new focal deficits, no tremors     Data Reviewed: I have personally reviewed following labs and imaging studies  CBC: Recent Labs  Lab 04/14/21 1339 04/15/21 0438 04/16/21 0431  WBC 8.4 8.3 8.3  NEUTROABS 6.9  --   --   HGB 10.5* 9.7* 9.8*  HCT 32.8* 30.4* 31.3*  MCV 88.9 89.1 90.5  PLT 295 251 024    Basic Metabolic Panel: Recent Labs  Lab 04/14/21 1339  04/14/21 1914 04/15/21 0438 04/16/21 0431  NA 138  --  136 135  K 4.3  --  3.2* 3.3*  CL 101  --  104 104  CO2 27  --  25 24  GLUCOSE 214*  --  106* 111*  BUN 15  --  13 12  CREATININE 1.10*  --  0.78 0.77  CALCIUM 8.3*  --  7.8* 8.0*  MG  --  1.7  --  1.5*    GFR: Estimated Creatinine Clearance: 48.1 mL/min (by C-G formula based on SCr of 0.77 mg/dL).  Liver Function Tests: Recent Labs  Lab 04/14/21 1339  AST 19  ALT 11  ALKPHOS 66  BILITOT 0.6  PROT 5.7*  ALBUMIN 2.7*    CBG: Recent Labs  Lab 04/15/21 1713 04/15/21 2157 04/16/21 0717 04/16/21 1142 04/16/21 1652  GLUCAP 125* 118* 98 127* 151*     Recent Results (from the past 240 hour(s))  Urine culture     Status: Abnormal (Preliminary result)   Collection Time:  04/14/21  1:35 PM   Specimen: Urine, Catheterized  Result Value Ref Range Status   Specimen Description   Final    URINE, CATHETERIZED Performed at [facility], [address], [place], [place] [address]    Special Requests   Final    NONE Performed at [facility], [address] [place], [place] [address]    Culture (A)  Final    >=100,000 COLONIES/mL PROTEUS MIRABILIS SUSCEPTIBILITIES TO FOLLOW Performed at [facility] [address] [place], [place] [address]    Report Status PENDING  Incomplete  Blood Culture (routine x 2)     Status: None (Preliminary result)   Collection Time: 04/14/21  1:39 PM   Specimen: BLOOD LEFT HAND  Result Value Ref Range Status   Specimen Description   Final    BLOOD LEFT HAND BOTTLES DRAWN AEROBIC AND ANAEROBIC   Special Requests Blood Culture adequate volume  Final   Culture   Final    NO GROWTH 2 DAYS Performed at [facility], [address] [place] [address] [place], [place] [address]    Report Status PENDING  Incomplete  Blood Culture (routine x 2)     Status: None (Preliminary result)   Collection Time: 04/14/21  1:39 PM   Specimen: BLOOD RIGHT FOREARM  Result Value Ref Range Status   Specimen  Description   Final    BLOOD RIGHT FOREARM BOTTLES DRAWN AEROBIC AND ANAEROBIC Performed at [facility], [address] [place], [place] [address]    Special Requests   Final    Blood Culture adequate volume Performed at [facility], [address], [place], [place] [address]    Culture  Setup Time   Final    GRAM POSITIVE RODS AEROBIC BOTTLE Gram Stain Report Called to,Read Back By and Verified With: HYLTON,L'[email or web address]'  BY MATTHEWS, B 6.28.22 [facility] GRAM POSITIVE RODS ANAEROBIC BOTTLE ONLY RESULT PREV. CALLED Performed at [facility], [address], [place], [place] [address]    Culture GRAM POSITIVE RODS  Final   Report Status PENDING  Incomplete  Resp Panel by RT-PCR (Flu A&B, Covid) Nasopharyngeal Swab     Status: None   Collection Time: 04/14/21  1:54 PM   Specimen: Nasopharyngeal Swab; Nasopharyngeal(NP) swabs in vial transport medium  Result Value Ref Range Status   SARS Coronavirus 2 by RT PCR NEGATIVE NEGATIVE Final    Comment: (NOTE) SARS-CoV-2 target nucleic acids are NOT DETECTED.  The SARS-CoV-2 RNA is generally detectable in upper respiratory specimens during the acute phase of infection. The lowest concentration of SARS-CoV-2 viral copies this assay can detect is 138 copies/mL. A negative result does not preclude SARS-Cov-2 infection and should not be used as the sole basis for treatment or other patient management decisions. A negative result may occur with  improper specimen collection/handling, submission of specimen other than nasopharyngeal swab, presence of viral mutation(s) within the areas targeted by this assay, and inadequate number of viral copies(<138 copies/mL). A negative result must be combined with clinical observations, patient history, and epidemiological information. The expected result is Negative.  Fact Sheet for Patients:  EntrepreneurPulse.com.au  Fact Sheet for Healthcare Providers:   IncredibleEmployment.be  This test is no t yet approved or cleared by the [place] FDA and  has been authorized for detection and/or diagnosis of SARS-CoV-2 by FDA under an Emergency Use Authorization (EUA). This EUA will remain  in effect (meaning this test can be used) for the duration of the COVID-19 declaration under Section  564(b)(1) of the Act, 21 U.S.C.section 360bbb-3(b)(1), unless the authorization is terminated  or revoked sooner.       Influenza A by PCR NEGATIVE NEGATIVE Final   Influenza B by PCR NEGATIVE NEGATIVE Final    Comment: (NOTE) The Xpert Xpress SARS-CoV-2/FLU/RSV plus assay is intended as an aid in the diagnosis of influenza from Nasopharyngeal swab specimens and should not be used as a sole basis for treatment. Nasal washings and aspirates are unacceptable for Xpert Xpress SARS-CoV-2/FLU/RSV testing.  Fact Sheet for Patients: EntrepreneurPulse.com.au  Fact Sheet for Healthcare Providers: IncredibleEmployment.be  This test is not yet approved or cleared by the [place] FDA and has been authorized for detection and/or diagnosis of SARS-CoV-2 by FDA under an Emergency Use Authorization (EUA). This EUA will remain in effect (meaning this test can be used) for the duration of the COVID-19 declaration under Section 564(b)(1) of the Act, 21 U.S.C. section 360bbb-3(b)(1), unless the authorization is terminated or revoked.  Performed at [facility], [address], [place], [place] [address]   MRSA Next Gen by PCR, Nasal     Status: None   Collection Time: 04/14/21  6:15 PM   Specimen: Nasal Mucosa; Nasal Swab  Result Value Ref Range Status   MRSA by PCR Next Gen NOT DETECTED NOT DETECTED Final    Comment: (NOTE) The GeneXpert MRSA Assay (FDA approved for NASAL specimens only), is one component of a comprehensive MRSA colonization surveillance program. It is not intended to diagnose MRSA  infection nor to guide or monitor treatment for MRSA infections. Test performance is not FDA approved in patients less than 45 years old. Performed at [facility], [address], [place], [place] [address]      Radiology Studies: ECHOCARDIOGRAM COMPLETE  Result Date: 04/15/2021    ECHOCARDIOGRAM REPORT   Patient Name:   Rebecca BELLOT Date of Exam: 04/15/2021 Medical Rec #:  [id number]        Height:       66.0 in Accession #:    [id number]       Weight:       135.8 lb Date of Birth:  November 22, 1932         BSA:          1.696 m Patient Age:    83 years         BP:           127/37 mmHg Patient Gender: F                HR:           67 bpm. Exam Location:  [facility] Procedure: 2D Echo, Cardiac Doppler and Color Doppler Indications:    Atrial Flutter I48.92  History:        Patient has prior history of Echocardiogram examinations, most                 recent 08/19/2018. Stroke, Arrythmias:Atrial Flutter; Risk                 Factors:Hypertension, Diabetes and Dyslipidemia. COVID +                 11/20/2020.  Sonographer:    Wenda Low Referring Phys: [place]  1. Left ventricular ejection fraction, by estimation, is 55 to 60%. The left ventricle has normal function. The left ventricle has no regional wall motion abnormalities. Left ventricular diastolic parameters were normal.  2. Right ventricular systolic function  is normal. The right ventricular size is normal. There is normal pulmonary artery systolic pressure. The estimated right ventricular systolic pressure is 81.1 mmHg.  3. The mitral valve is abnormal. Mild mitral valve regurgitation. Moderate mitral annular calcification.  4. The aortic valve is tricuspid. Aortic valve regurgitation is mild. Mild to moderate aortic valve sclerosis/calcification is present, without any evidence of aortic stenosis. Aortic regurgitation PHT measures 589 msec. Aortic valve mean gradient measures 3.0 mmHg.  5. The inferior vena cava is  normal in size with greater than 50% respiratory variability, suggesting right atrial pressure of 3 mmHg. FINDINGS  Left Ventricle: Left ventricular ejection fraction, by estimation, is 55 to 60%. The left ventricle has normal function. The left ventricle has no regional wall motion abnormalities. The left ventricular internal cavity size was normal in size. There is  no left ventricular hypertrophy. Left ventricular diastolic parameters were normal. Right Ventricle: The right ventricular size is normal. No increase in right ventricular wall thickness. Right ventricular systolic function is normal. There is normal pulmonary artery systolic pressure. The tricuspid regurgitant velocity is 2.38 m/s, and  with an assumed right atrial pressure of 3 mmHg, the estimated right ventricular systolic pressure is 91.4 mmHg. Left Atrium: Left atrial size was normal in size. Right Atrium: Right atrial size was normal in size. Pericardium: There is no evidence of pericardial effusion. Mitral Valve: The mitral valve is abnormal. Moderate mitral annular calcification. Mild mitral valve regurgitation. MV peak gradient, 6.6 mmHg. The mean mitral valve gradient is 3.0 mmHg. Tricuspid Valve: The tricuspid valve is grossly normal. Tricuspid valve regurgitation is trivial. Aortic Valve: The aortic valve is tricuspid. There is mild to moderate aortic valve annular calcification. Aortic valve regurgitation is mild. Aortic regurgitation PHT measures 589 msec. Mild to moderate aortic valve sclerosis/calcification is present, without any evidence of aortic stenosis. Aortic valve mean gradient measures 3.0 mmHg. Aortic valve peak gradient measures 6.6 mmHg. Aortic valve area, by VTI measures 2.11 cm. Pulmonic Valve: The pulmonic valve was grossly normal. Pulmonic valve regurgitation is trivial. Aorta: The aortic root is normal in size and structure. Venous: The inferior vena cava is normal in size with greater than 50% respiratory variability,  suggesting right atrial pressure of 3 mmHg. IAS/Shunts: No atrial level shunt detected by color flow Doppler.  LEFT VENTRICLE PLAX 2D LVIDd:         4.08 cm  Diastology LVIDs:         2.89 cm  LV e' medial:    9.81 cm/s LV PW:         0.96 cm  LV E/e' medial:  11.4 LV IVS:        0.93 cm  LV e' lateral:   11.40 cm/s LVOT diam:     2.00 cm  LV E/e' lateral: 9.8 LV SV:         71 LV SV Index:   42 LVOT Area:     3.14 cm  RIGHT VENTRICLE RV Basal diam:  3.25 cm RV Mid diam:    2.59 cm RV S prime:     13.20 cm/s TAPSE (M-mode): 2.6 cm LEFT ATRIUM             Index       RIGHT ATRIUM          Index LA diam:        3.40 cm 2.00 cm/m  RA Area:     9.94 cm LA Vol (A2C):   38.8 ml  22.87 ml/m RA Volume:   18.80 ml 11.08 ml/m LA Vol (A4C):   38.1 ml 22.46 ml/m LA Biplane Vol: 38.4 ml 22.64 ml/m  AORTIC VALVE AV Area (Vmax):    2.05 cm AV Area (Vmean):   2.01 cm AV Area (VTI):     2.11 cm AV Vmax:           128.00 cm/s AV Vmean:          86.100 cm/s AV VTI:            0.336 m AV Peak Grad:      6.6 mmHg AV Mean Grad:      3.0 mmHg LVOT Vmax:         83.60 cm/s LVOT Vmean:        55.000 cm/s LVOT VTI:          0.226 m LVOT/AV VTI ratio: 0.67 AI PHT:            589 msec  AORTA Ao Root diam: 3.00 cm Ao Asc diam:  2.90 cm MITRAL VALVE                TRICUSPID VALVE MV Area (PHT): 2.95 cm     TR Peak grad:   22.7 mmHg MV Area VTI:   1.86 cm     TR Vmax:        238.00 cm/s MV Peak grad:  6.6 mmHg MV Mean grad:  3.0 mmHg     SHUNTS MV Vmax:       1.28 m/s     Systemic VTI:  0.23 m MV Vmean:      82.3 cm/s    Systemic Diam: 2.00 cm MV Decel Time: 257 msec MV E velocity: 112.00 cm/s MV A velocity: 115.00 cm/s MV E/A ratio:  0.97 Rozann Lesches MD Electronically signed by Rozann Lesches MD Signature Date/Time: 04/15/2021/10:41:34 AM    Final      Scheduled Meds:  (feeding supplement) PROSource Plus  30 mL Oral TID BM   chlorhexidine  15 mL Mouth Rinse BID   Chlorhexidine Gluconate Cloth  6 each Topical Daily    enoxaparin (LOVENOX) injection  40 mg Subcutaneous Q24H   feeding supplement  237 mL Oral BID BM   insulin aspart  0-5 Units Subcutaneous QHS   insulin aspart  0-9 Units Subcutaneous TID WC   mouth rinse  15 mL Mouth Rinse q12n4p   metoprolol tartrate  50 mg Oral BID   multivitamin with minerals  1 tablet Oral Daily   pantoprazole  40 mg Oral Daily   Continuous Infusions:  amiodarone Stopped (04/15/21 1752)   [START ON 04/17/2021] cefTRIAXone (ROCEPHIN)  IV       LOS: 2 days   Roxan Hockey, MD [facility]   To contact the attending provider between 7A-7P or the covering provider during after hours 7P-7A, please log into the web site [email or web address] and access using universal [place] password for that web site. If you do not have the password, please call the hospital operator.  04/16/2021, 4:54 PM

## 2021-04-16 NOTE — TOC Initial Note (Signed)
Transition of Care [facility]) - Initial/Assessment Note    Patient Details  Name: Rebecca Hardy MRN: [id number] Date of Birth: 1933-02-16  [facility] CM/SW Contact:    Ihor Gully, LCSW Phone Number: 04/16/2021, 1:13 PM  Clinical Narrative:                 Patient is from [facility]. Admitted for Sepsis. Has been a resident at facility for a year. Daughter plans for her to return to the facility at discharge. At baseline, patient uses a wheelchair or is in the bed.   Expected Discharge Plan: Assisted Living Barriers to Discharge: Continued Medical Work up   Patient Goals and CMS Choice        Expected Discharge Plan and Services Expected Discharge Plan: Assisted Living       Living arrangements for the past 2 months: Assisted Living Facility                                      Prior Living Arrangements/Services Living arrangements for the past 2 months: [place] Lives with:: Facility Resident Patient language and need for interpreter reviewed:: Yes Do you feel safe going back to the place where you live?: Yes      Need for Family Participation in Patient Care: Yes (Comment) Care giver support system in place?: Yes (comment) Current home services: DME Criminal Activity/Legal Involvement Pertinent to Current Situation/Hospitalization: No - Comment as needed  Activities of Daily Living Home Assistive Devices/Equipment: None ADL Screening (condition at time of admission) Patient's cognitive ability adequate to safely complete daily activities?: No Is the patient deaf or have difficulty hearing?: No Does the patient have difficulty seeing, even when wearing glasses/contacts?: No Does the patient have difficulty concentrating, remembering, or making decisions?: Yes Patient able to express need for assistance with ADLs?: No Does the patient have difficulty dressing or bathing?: Yes Independently performs ADLs?: No Does the  patient have difficulty walking or climbing stairs?: Yes Weakness of Legs: Both Weakness of Arms/Hands: Both  Permission Sought/Granted Permission sought to share information with : Family Supports    Share Information with NAME: [name], daughter           Emotional Assessment       Orientation: : Oriented to Self, Oriented to Place Alcohol / Substance Use: Not Applicable Psych Involvement: No (comment)  Admission diagnosis:  Acute cystitis with hematuria [N30.01] Sepsis ([place]) [A41.9] Sepsis, due to unspecified organism, unspecified whether acute organ dysfunction present [facility]) [A41.9] Patient Active Problem List   Diagnosis Date Noted   Sepsis ([place]) 04/14/2021   Dementia ([place]) 04/14/2021   COVID-19 virus infection    Thrombocytopenia ([place])    Hx of transient ischemic attack (TIA)    Acute renal failure superimposed on stage 3b chronic kidney disease ([place]) 11/21/2020   AKI (acute kidney injury) ([place]) 11/20/2020   Acute ischemic cerebrovascular accident (CVA) involving right middle cerebral artery territory ([place]) 08/20/2018   Narrow complex tachycardia ([place]) 08/20/2018   Left-sided weakness 08/19/2018   Essential hypertension 08/19/2018   HLD (hyperlipidemia) 08/19/2018   CKD (chronic kidney disease) stage 3, GFR 30-59 ml/min ([place]) 08/19/2018   Type 2 diabetes mellitus with hyperlipidemia ([place]) 08/19/2018   PCP:  Bonnita Nasuti, MD Pharmacy:   [facility] [place] [address] Phone: [phone]  Fax: [phone]     Social Determinants of Health (SDOH) Interventions    Readmission Risk Interventions No flowsheet data found.

## 2021-04-16 NOTE — Progress Notes (Signed)
Per daughter, patient is on a pureed diet at [facility]

## 2021-04-16 NOTE — Progress Notes (Signed)
Lab called  to state that blood cultures came back for gram positive rods in the aerobic bottle.

## 2021-04-16 NOTE — Consult Note (Signed)
Consultation Note Date: 04/16/2021   Patient Name: Rebecca Hardy  DOB: June 30, 1933  MRN: [id number]  Age / Sex: 87 y.o., female  PCP: Bonnita Nasuti, MD Referring Physician: Roxan Hockey, MD  Reason for Consultation: Establishing goals of care  HPI/Patient Profile: 87 y.o. female  with past medical history of HTN/HLD, CVA with left weakness, dementia, DM, GERD, ductal carcinoma of breast left admitted on 04/14/2021 with severe sepsis with bacteremia, a flutter with RVR.   Clinical Assessment and Goals of Care: I have reviewed medical records including EPIC notes, labs and imaging, received report from RN, assessed the patient.  Rebecca Hardy is lying quietly in bed.  She appears acutely/chronically ill and quite frail.  She has left weakness.  She is alert and oriented to person and situation, I believe able to make her basic needs known.  There is no family at bedside at this time.    Call to daughter, Rebecca Hardy, to discuss diagnosis prognosis, GOC, EOL wishes, disposition and options.  I introduced Palliative Medicine as specialized medical care for people living with serious illness. It focuses on providing relief from the symptoms and stress of a serious illness. The goal is to improve quality of life for both the patient and the family.   I share that palliative medicine talks about the "what if's, and maybe's".  We discussed a brief life review of the patient.  Rebecca Hardy has 5 children.  She has lived in [facility] for about 1 year.  She has limited mobility there.    We talk in detail about Rebecca Hardy's acute health concerns of UTI/sepsis, bacteremia.  We talked about the treatment plan in detail including, but not limited to lab testing, IV antibiotics and other medicines.  We talked about time for outcomes and further test results.  We also talked about 2D echo and cardiology consult/treatment  plan.   We talked about disposition.  Rebecca Hardy states that their preference is for Rebecca Hardy to return to [facility].  I share that sometimes people become deconditioned and need short-term rehab.  We talked about the difficulties for people with memory loss and going to short-term rehab.  We talked about time for outcomes.   Advanced directives, concepts specific to code status, were considered and discussed.  We talked about the concept of "treat the treatable, but allowing natural passing".  Rebecca Hardy states that she will talk with her siblings.  Discussed the importance of continued conversation with family and the medical providers regarding overall plan of care and treatment options, ensuring decisions are within the context of the patient's values and GOCs.    Questions and concerns were addressed.  The family was encouraged to call with questions or concerns.  PMT will continue to support holistically.  Conference with attending, bedside nursing staff, transition of care team related to patient condition, needs, goals of care, disposition.     HCPOA   NEXT OF KIN -Rebecca Hardy has 5 children.  They make choices as a team  as she has no designated Designer, television/film set.    SUMMARY OF RECOMMENDATIONS   At this point continue to treat the treatable Time for outcomes. Prefer to return to ALF with home health if needed over short-term rehab.   Code Status/Advance Care Planning: Full code -we discussed the concept of "treat the treatable, but allowing natural passing".  Rebecca Hardy states that she will discuss this with her siblings.  Symptom Management:  Per hospitalist, no additional needs at this time.  Palliative Prophylaxis:  Frequent Pain Assessment, Palliative Wound Care, and Turn Reposition  Additional Recommendations (Limitations, Scope, Preferences): Full Scope Treatment  Psycho-social/Spiritual:  Desire for further Chaplaincy support:no Additional Recommendations: Caregiving   Support/Resources and Education on Hospice  Prognosis:  Unable to determine, based on outcomes.  6 months or less would not be surprising based on chronic illness burden, poor functional status, sepsis bacteremia.  Discharge Planning:  To be determined, based on outcomes.  Currently lives in ALF, anticipate need for short-term rehab at the least.       Primary Diagnoses: Present on Admission:  Sepsis ([place])  Essential hypertension  CKD (chronic kidney disease) stage 3, GFR 30-59 ml/min (HCC)  Type 2 diabetes mellitus with hyperlipidemia ([place])  Dementia ([place])   I have reviewed the medical record, interviewed the patient and family, and examined the patient. The following aspects are pertinent.  Past Medical History:  Diagnosis Date   DCIS (ductal carcinoma in situ) of breast    left breast   Diabetes mellitus    GERD (gastroesophageal reflux disease)    High cholesterol    History of TIA (transient ischemic attack)    Hypertension    Stroke [facility])    Social History   Socioeconomic History   Marital status: Widowed    Spouse name: Not on file   Number of children: Not on file   Years of education: Not on file   Highest education level: Not on file  Occupational History   Not on file  Tobacco Use   Smoking status: Never   Smokeless tobacco: Never  Vaping Use   Vaping Use: Never used  Substance and Sexual Activity   Alcohol use: No   Drug use: No   Sexual activity: Not on file  Other Topics Concern   Not on file  Social History Narrative   Not on file   Social Determinants of Health   Financial Resource Strain: Not on file  Food Insecurity: Not on file  Transportation Needs: Not on file  Physical Activity: Not on file  Stress: Not on file  Social Connections: Not on file   Family History  Problem Relation Age of Onset   Cancer Mother    Hypertension Father    Scheduled Meds:  (feeding supplement) PROSource Plus  30 mL Oral TID BM   chlorhexidine  15 mL  Mouth Rinse BID   Chlorhexidine Gluconate Cloth  6 each Topical Daily   enoxaparin (LOVENOX) injection  40 mg Subcutaneous Q24H   feeding supplement  237 mL Oral BID BM   insulin aspart  0-5 Units Subcutaneous QHS   insulin aspart  0-9 Units Subcutaneous TID WC   mouth rinse  15 mL Mouth Rinse q12n4p   metoprolol tartrate  50 mg Oral BID   multivitamin with minerals  1 tablet Oral Daily   pantoprazole  40 mg Oral Daily   Continuous Infusions:  amiodarone Stopped (04/15/21 1752)   [START ON 04/17/2021] cefTRIAXone (ROCEPHIN)  IV  PRN Meds:.acetaminophen **OR** acetaminophen, LORazepam, ondansetron **OR** ondansetron (ZOFRAN) IV, polyethylene glycol Medications Prior to Admission:  Prior to Admission medications   Medication Sig Start Date End Date Taking? Authorizing Provider  aspirin 325 MG tablet Take 1 tablet (325 mg total) by mouth daily. 08/24/18  Yes Samuella Cota, MD  Ergocalciferol (VITAMIN D2) 50 MCG (2000 UT) TABS Take 1 capsule by mouth daily.   Yes [provider]  furosemide (LASIX) 40 MG tablet Take 0.5 tablets (20 mg total) by mouth daily. 11/26/20  Yes Barton Dubois, MD  linagliptin (TRADJENTA) 5 MG TABS tablet Take 5 mg by mouth daily.   Yes [provider]  loratadine (CLARITIN) 10 MG tablet Take 10 mg by mouth daily.   Yes [provider]  LORazepam (ATIVAN) 0.5 MG tablet Take 0.5 mg by mouth 2 (two) times daily as needed for anxiety.   Yes [provider]  metoprolol succinate (TOPROL-XL) 25 MG 24 hr tablet Take 25 mg by mouth daily.   Yes [provider]  sertraline (ZOLOFT) 25 MG tablet Take 25 mg by mouth at bedtime.  03/27/15  Yes [provider]  tiZANidine (ZANAFLEX) 2 MG tablet Take 2 mg by mouth 3 (three) times daily.   Yes [provider]  TOUJEO SOLOSTAR 300 UNIT/ML Solostar Pen Inject 25 Units into the skin daily. Prime pen with 3u prior to each use 11/22/20  Yes Barton Dubois, MD  rosuvastatin  (CRESTOR) 20 MG tablet Take 20 mg by mouth daily.  Patient not taking: No sig reported 07/16/18   [provider]   Allergies  Allergen Reactions   Codeine Other (See Comments)    Unknown reaction   Penicillins Other (See Comments)    Unknown reaction  Has patient had a PCN reaction causing immediate rash, facial/tongue/throat swelling, SOB or lightheadedness with hypotension: Uknown Has patient had a PCN reaction causing severe rash involving mucus membranes or skin necrosis: Uknown Has patient had a PCN reaction that required hospitalization: Unknown Has patient had a PCN reaction occurring within the last 10 years: No If all of the above answers are "NO", then may proceed with Cephalosporin use.   Sulfa Antibiotics    Review of Systems  Unable to perform ROS: Acuity of condition   Physical Exam Vitals and nursing note reviewed.  Constitutional:      General: She is not in acute distress.    Appearance: She is ill-appearing.  HENT:     Head: Atraumatic.     Mouth/Throat:     Mouth: Mucous membranes are moist.  Cardiovascular:     Rate and Rhythm: Normal rate.  Pulmonary:     Effort: Pulmonary effort is normal. No respiratory distress.  Skin:    General: Skin is warm and dry.  Neurological:     Mental Status: She is alert.     Comments: Oriented to person and situation  Psychiatric:     Comments: Calm and cooperative, not fearful    Vital Signs: BP (!) 113/32   Pulse (!) 58   Temp 98.2 F (36.8 C) (Axillary)   Resp 13   Ht 5\' 6"  (1.676 m)   Wt 61.6 kg   SpO2 99%   BMI 21.92 kg/m  Pain Scale: PAINAD POSS *See Group Information*: 1-Acceptable,Awake and alert     SpO2: SpO2: 99 % O2 Device:SpO2: 99 % O2 Flow Rate: .O2 Flow Rate (L/min): 2 L/min  IO: Intake/output summary:  Intake/Output Summary (Last 24 hours) at  04/16/2021 1527 Last data filed at 04/16/2021 1008 Gross per 24 hour  Intake 443.56 ml  Output 300 ml  Net 143.56 ml    LBM:    Baseline Weight: Weight: 74 kg Most recent weight: Weight:  (Bed not functioning properly. Could not obtain weight.)     Palliative Assessment/Data:   Flowsheet Rows    Flowsheet Row Most Recent Value  Intake Tab   Referral Department Hospitalist  Unit at Time of Referral Intermediate Care Unit  Palliative Care Primary Diagnosis Sepsis/Infectious Disease  Date Notified 04/15/21  Palliative Care Type New Palliative care  Reason for referral Clarify Goals of Care  Date of Admission 04/14/21  Date first seen by Palliative Care 04/16/21  # of days Palliative referral response time 1 Day(s)  # of days IP prior to Palliative referral 1  Clinical Assessment   Palliative Performance Scale Score 20%  Pain Max last 24 hours Not able to report  Pain Min Last 24 hours Not able to report  Dyspnea Max Last 24 Hours Not able to report  Dyspnea Min Last 24 hours Not able to report  Psychosocial & Spiritual Assessment   Palliative Care Outcomes        Time In: 1310 Time Out: 1420 Time Total: 70 minutes  Greater than 50%  of this time was spent counseling and coordinating care related to the above assessment and plan.  Signed by: Drue Novel, NP   Please contact Palliative Medicine Team phone at [phone] for questions and concerns.  For individual provider: See Shea Evans

## 2021-04-16 NOTE — Evaluation (Addendum)
Clinical/Bedside Swallow Evaluation Patient Details  Name: Rebecca Hardy MRN: [id number] Date of Birth: 10/12/1933  Today's Date: 04/16/2021 Time: SLP Start Time (ACUTE ONLY): 1520 SLP Stop Time (ACUTE ONLY): 9735 SLP Time Calculation (min) (ACUTE ONLY): 20 min  Past Medical History:  Past Medical History:  Diagnosis Date   DCIS (ductal carcinoma in situ) of breast    left breast   Diabetes mellitus    GERD (gastroesophageal reflux disease)    High cholesterol    History of TIA (transient ischemic attack)    Hypertension    Stroke [facility])    Past Surgical History:  Past Surgical History:  Procedure Laterality Date   ABDOMINAL HYSTERECTOMY     BREAST LUMPECTOMY     CESAREAN SECTION     LOOP RECORDER INSERTION N/A 08/23/2018   Procedure: LOOP RECORDER INSERTION;  Surgeon: Evans Lance, MD;  Location: [facility];  Service: Cardiovascular;  Laterality: N/A;   TEE WITHOUT CARDIOVERSION N/A 08/23/2018   Procedure: TRANSESOPHAGEAL ECHOCARDIOGRAM (TEE);  Surgeon: Dorothy Spark, MD;  Location: [facility] ENDOSCOPY;  Service: Cardiovascular;  Laterality: N/A;   HPI:  Rebecca Hardy is a 87 y.o. female with medical history significant for hypertension, CVA with left-sided weakness, dementia, diabetes mellitus.   Patient was brought to the ED from facility reports of possible seizure activity, patient's temperature was 103 per nursing staff.  Patient was seizing for about 5 minutes prior to EMS arrival, 2.5 mg of Versed was given patient stopped seizing for about 1 and half  minute minutes, and then repeat dose of 2.5 mg of Versed was given. Pt is a resident at [facility] and reportedly on a pureed diet. BSE requested. CT head 04/14/21 indicated No evidence of acute intracranial abnormality.  2. Numerous chronic infarcts as above including interval right MCA and right PCA infarcts. CXR on 04/14/21 indicated No active disease.  Assessment / Plan / Recommendation Clinical Impression  Pt  seen for clinical swallowing evaluation with various consistencies including ice chips, thin via tsp, nectar via varying volumes and puree.  Solids were not attempted since pt is on a puree diet at baseline at ALF.  Pt with immediate cough with thin via tsp; delayed initiation of the swallow and oral holding noted with all consistencies.  Pt exhibited left facial weakness, decreased sensation on L and anterior loss via cup with nectar-thickened liquids.  Straw sips transitioned into pharynx without overt s/s of aspiration. Pt demonstrated a left facial tic infrequently during evaluation which did not appear to impact her swallowing function.  Pt relayed this wasn't typical when asked about this occurrence.  Recommend Dysphagia 1/nectar-thickened liquids d/t overt s/s with thin via tsp and ST will f/u for potential progression to thin/diet tolerance while in acute setting.  Thank you for this consult. SLP Visit Diagnosis: Dysphagia, oropharyngeal phase (R13.12)    Aspiration Risk  Mild aspiration risk;Moderate aspiration risk    Diet Recommendation     Medication Administration: Crushed with puree    Other  Recommendations Oral Care Recommendations: Oral care BID Other Recommendations: Order thickener from pharmacy   Follow up Recommendations Skilled Nursing facility      Frequency and Duration min 1 x/week  1 week       Prognosis Prognosis for Safe Diet Advancement: Good      Swallow Study   General Date of Onset: 04/14/21 HPI: Rebecca Hardy is a 87 y.o. female with medical history significant for hypertension, CVA with  left-sided weakness, dementia, diabetes mellitus.   Patient was brought to the ED from facility reports of possible seizure activity, patient's temperature was 103 per nursing staff.  Patient was seizing for about 5 minutes prior to EMS arrival, 2.5 mg of Versed was given patient stopped seizing for about 1 and half  minute minutes, and then repeat dose of 2.5 mg of  Versed was given. Pt is a resident at [facility] and reportedly on a pureed diet. BSE requested. Type of Study: Bedside Swallow Evaluation Previous Swallow Assessment: n/a Diet Prior to this Study: Dysphagia 1 (puree);Thin liquids Temperature Spikes Noted: No Respiratory Status: Room air History of Recent Intubation: No Behavior/Cognition: Alert;Cooperative;Requires cueing;Confused Oral Cavity Assessment: Within Functional Limits;Dry Oral Care Completed by SLP: Yes Oral Cavity - Dentition: Poor condition;Missing dentition Vision: Impaired for self-feeding Self-Feeding Abilities: Needs assist;Needs set up Patient Positioning: Upright in bed Baseline Vocal Quality: Low vocal intensity Volitional Cough: Weak Volitional Swallow: Unable to elicit    Oral/Motor/Sensory Function Overall Oral Motor/Sensory Function: Mild impairment Facial ROM: Reduced left Facial Symmetry: Abnormal symmetry left Facial Strength: Reduced left Lingual Strength: Reduced   Ice Chips Ice chips: Impaired Presentation: Spoon Oral Phase Functional Implications: Prolonged oral transit Pharyngeal Phase Impairments: Suspected delayed Swallow   Thin Liquid Thin Liquid: Impaired Presentation: Spoon Oral Phase Functional Implications: Oral holding Pharyngeal  Phase Impairments: Cough - Immediate;Suspected delayed Swallow    Nectar Thick Nectar Thick Liquid: Impaired Presentation: Cup;Straw Oral phase functional implications: Oral holding Pharyngeal Phase Impairments: Suspected delayed Swallow   Honey Thick Honey Thick Liquid: Not tested   Puree Puree: Impaired Presentation: Spoon Oral Phase Functional Implications: Prolonged oral transit Pharyngeal Phase Impairments: Suspected delayed Swallow   Solid     Solid: Not tested      Elvina Sidle, M.S., CCC-SLP 04/16/2021,4:46 PM

## 2021-04-16 NOTE — Progress Notes (Signed)
Patient seen and evaluated at bedside for more than 30 minutes of "twitching." Left side facial twitching. Patient is somnolent, but she was recently given ativan. She responds to sternal rub, and reports that she is not in pain. Chart review reveals some electrolyte abnormalities from earlier today. Will recheck those electrolytes. Started Keppra. Continue to monitor.

## 2021-04-16 NOTE — Progress Notes (Signed)
[facility] Progress Note Patient Name: Rebecca Hardy DOB: [birth year] MRN: [id number]   Date of Service  04/16/2021  HPI/Events of Note  Patient is somnolent,  bedside reports apneic episodes and a soft blood pressure.  eICU Interventions  Stat ABG with lactic acid, if respiratory status is okay will order a head CT, otherwise will proceed with intubation. Hold off on further sedatives.        Rebecca Hardy 04/16/2021, 11:45 PM

## 2021-04-16 NOTE — Progress Notes (Addendum)
Pt began hollering out, RN to room and found patient to be having a jerk like motion of the head.Pt also had incontinence episode. All vitals WNL.CBG 151. Pupils are equal round and reactive to light. Patient denies pain. Eyes gaze more towards the right. Patient able to follow simple commands. Prior deficits from previous stroke present. Dr. Joesph Fillers notified. 2mg  of Ativan given per PRN order. With ativan jerk like motion decreased but not absent. Pt calmed and was cleansed. Order for EEG placed.

## 2021-04-17 ENCOUNTER — Inpatient Hospital Stay (HOSPITAL_COMMUNITY)
Admit: 2021-04-17 | Discharge: 2021-04-17 | Disposition: A | Discharge status: Home / Self Care | Payer: Medicare Other | Attending: Family Medicine | Admitting: Family Medicine

## 2021-04-17 ENCOUNTER — Inpatient Hospital Stay (HOSPITAL_COMMUNITY): Payer: Medicare Other

## 2021-04-17 DIAGNOSIS — A498 Other bacterial infections of unspecified site: Secondary | ICD-10-CM | POA: Diagnosis present

## 2021-04-17 DIAGNOSIS — R569 Unspecified convulsions: Secondary | ICD-10-CM

## 2021-04-17 LAB — GLUCOSE, CAPILLARY
Glucose-Capillary: 111 mg/dL — ABNORMAL HIGH (ref 70–99)
Glucose-Capillary: 117 mg/dL — ABNORMAL HIGH (ref 70–99)
Glucose-Capillary: 98 mg/dL (ref 70–99)
Glucose-Capillary: 77 mg/dL (ref 70–99)
Glucose-Capillary: 81 mg/dL (ref 70–99)

## 2021-04-17 LAB — BLOOD GAS, ARTERIAL
Acid-base deficit: 2.1 mmol/L — ABNORMAL HIGH (ref 0.0–2.0)
Bicarbonate: 22.7 mmol/L (ref 20.0–28.0)
Drawn by: 35043
FIO2: 28
O2 Saturation: 99.2 %
Patient temperature: 36.5
pCO2 arterial: 38.5 mmHg (ref 32.0–48.0)
pH, Arterial: 7.379 (ref 7.350–7.450)
pO2, Arterial: 131 mmHg — ABNORMAL HIGH (ref 83.0–108.0)

## 2021-04-17 MED ORDER — CYANOCOBALAMIN 1000 MCG/ML IJ SOLN
1000.0000 ug | Freq: Once | INTRAMUSCULAR | Status: AC
  Administered 2021-04-17: 1000 ug via INTRAMUSCULAR
  Filled 2021-04-17: qty 1

## 2021-04-17 MED ORDER — LACOSAMIDE NICU IV SYRINGE 10 MG/ML: 50.0000 mg | Freq: Two times a day (BID) | INTRAVENOUS | Status: DC

## 2021-04-17 MED ORDER — SODIUM CHLORIDE 0.9 % IV SOLN: INTRAVENOUS | Status: DC

## 2021-04-17 MED ORDER — SODIUM CHLORIDE 0.9 % IV SOLN
50.0000 mg | Freq: Two times a day (BID) | INTRAVENOUS | Status: DC
  Administered 2021-04-17 – 2021-04-24 (×14): 50 mg via INTRAVENOUS
  Filled 2021-04-17 (×19): qty 5

## 2021-04-17 MED ORDER — LEVETIRACETAM IN NACL 500 MG/100ML IV SOLN: 500.0000 mg | Freq: Two times a day (BID) | INTRAVENOUS | Status: DC

## 2021-04-17 MED ORDER — LACOSAMIDE 200 MG/20ML IV SOLN
INTRAVENOUS | Status: AC
  Filled 2021-04-17: qty 20

## 2021-04-17 MED ORDER — SODIUM CHLORIDE 0.9 % IV SOLN
250.0000 mL | INTRAVENOUS | Status: DC
  Administered 2021-04-17 – 2021-04-23 (×3): 250 mL via INTRAVENOUS

## 2021-04-17 MED ORDER — LEVETIRACETAM IN NACL 1000 MG/100ML IV SOLN
1000.0000 mg | Freq: Two times a day (BID) | INTRAVENOUS | Status: DC
  Administered 2021-04-17 – 2021-04-24 (×14): 1000 mg via INTRAVENOUS
  Filled 2021-04-17 (×14): qty 100

## 2021-04-17 MED ORDER — DEXTROSE-NACL 5-0.9 % IV SOLN: INTRAVENOUS | Status: AC

## 2021-04-17 MED ORDER — NOREPINEPHRINE 4 MG/250ML-% IV SOLN
2.0000 ug/min | INTRAVENOUS | Status: DC
  Administered 2021-04-17: 2 ug/min via INTRAVENOUS
  Filled 2021-04-17: qty 250

## 2021-04-17 NOTE — Progress Notes (Addendum)
PROGRESS NOTE    Rebecca Hardy  WYO:[id number] DOB: 08/25/1933 DOA: 04/14/2021 PCP: Bonnita Nasuti, MD    Chief Complaint  Patient presents with   Seizures    Brief admission Narrative:  Rebecca Hardy is a 87 y.o. female with medical history significant for hypertension, CVA with left-sided weakness, dementia, diabetes mellitus.  Patient was brought to the ED from facility reports of possible seizure activity, patient's temperature was 103 per nursing staff.  Patient was seizing for about 5 minutes prior to EMS arrival, 2.5 mg of Versed was given patient stopped seizing for about 1 and half  minute minutes, and then repeat dose of 2.5 mg of Versed was given. EDP talked to nursing home staff, reported at about 10:30 AM patient was okay, and about noon they went to feed her and patient was shaking but still able to talk, and was oriented x1 this is her baseline.  Does reported that this has happened in the past, and was due to hypoglycemia.  Her blood sugars have been elevated.  Also reports wounds to her feet which were x-rayed and negative for osteomyelitis. On my evaluation patient is awake, alert, verbalizing able to tell me her name but not answer subsequent questions.   ED Course: Febrile to 101.7.  Tachycardic 156, respiratory rate 12-22.  Blood pressure systolic 77-412.  O2 sats greater than 98% on room air.  Lactic acid 3 > 2.  WBC 8.4.  UA with large leukocytes.  Head CT without acute abnormality.  Port chest x-ray unremarkable.  500 mils normal saline bolus given in route to ED, 1500 mils sepsis fluid bolus given in ED.  EDP talked to cardiologist on-call- Dr. Doree Albee, he also initial concern that this was atrial flutter.  And recommendation was to give IV metoprolol and if not effective start IV amiodarone.  Assessment & Plan:  1)Severe sepsis with septic shock due to Proteus Mirabilis UTI-- -Patient met severe sepsis criteria on exam secondary to UTI; high-grade temperature,  elevated heart rate, elevated lactic acid and acute kidney injury. -Urine Cx from 04/14/21  with Proteus Mirabilis --sensitivities pending Blood Cx from 04/14/21 with GPR-final ID pending -COVID test negative -c/n iv Rocephin 2 gm daily -Persistent hypotension due to septic shock required IV Levophed overnight, patient has now been weaned off Levophed with improved BP  2)New Onset Seizures----patient with history of prior strokes -EEG from 04/17/2021 noted -Started on Keppra -May use lorazepam as needed -Official neuro consult pending  3)-Atrial flutter with RVR -weaned off amiodarone iv drip  C/n metoprolol 50 mg bid -Cardiology consult appreciated -2D echo with EF of 55 to 60 % -No anticoagulation in the setting of advanced dementia and high risk for falls. -CHADSVASC score >5 -Continue Aspirin  4)History of stroke/TIA with Left-sided weakness -CT head shows old  right MCA and right PCA infarcts. -Continue aspirin and and lipitor -No additional new focal deficits appreciated.  5)-hypokalemia/Hypomagnesemia--- replace and recheck  6)-Essential hypertension -Stable overall -Continue adjusted dose of metoprolol.  7)-acute kidney injury on CKD (chronic kidney disease) stage 3a, GFR 30-59 ml/min (HCC) -In the setting of dehydration and UTI -Back to baseline after fluid resuscitation -Continue treatment with antibiotic -Follow renal function trend -Minimize using nephrotoxic agents, hypotension and the use of contrast.  8-Type 2 diabetes mellitus with hyperlipidemia (HCC) Use Novolog/Humalog Sliding scale insulin with Accu-Cheks/Fingersticks as ordered   9-Dementia ([place]) -Continue constant reorientation supportive care. -No behavioral disturbances at this time  10)Gastroesophageal reflux disease/GI prophylaxis -  Continue PPI.  11)Chronic Anemia-- Hgb is 9.8 which is close to baseline, no bleeding concerns  12) social/ethics--- palliative care consult appreciated, remains  a full code  DVT prophylaxis: Lovenox Code Status: Full code Family Communication: Daughter Shirlean Mylar updated Disposition:   Status is: Inpatient  Remains inpatient appropriate because: Sepsis and new onset seizures requiring IV antibiotics and IV Keppra  Dispo: The patient is from: [facility]              Anticipated d/c is to: Discharge back to facility              Patient currently is not medically stable to d/c.   Difficult to place patient No     Consultants:  Cardiology service Palliative care service Neurology  Procedures:  See below for x-ray report 2D echo:  Antimicrobials: Ceftriaxone   Subjective: -Seizure-like activity last evening and overnight received Ativan, more lethargic today -No significant oral intake and given seizures and sleepiness post Ativan -Was hypotensive overnight, required IV Levophed for pressure support  Objective: Vitals:   04/17/21 1515 04/17/21 1530 04/17/21 1545 04/17/21 1600  BP: (!) 131/38 (!) 122/39 (!) 126/31 (!) 124/42  Pulse: 66 64 66 73  Resp: '15 12 19 ' (!) 22  Temp:      TempSrc:      SpO2: 100% 100% 100% 100%  Weight:      Height:        Intake/Output Summary (Last 24 hours) at 04/17/2021 1651 Last data filed at 04/17/2021 1553 Gross per 24 hour  Intake 821.28 ml  Output 500 ml  Net 321.28 ml   Filed Weights   04/14/21 1306 04/14/21 1816  Weight: 74 kg 61.6 kg   Examination:  Physical Exam Gen:-Lethargic after Ativan  HEENT:- [place].AT, No sclera icterus Neck-Supple Neck,No JVD,.  Lungs-  mostly clear, fair air movement CV- S1, S2 normal Abd-  +ve B.Sounds, Abd Soft, No tenderness,    Extremity/Skin:- No  edema,   +pulses  Psych-baseline/underlying cognitive and memory deficits,  neuro-Lt UE weakness with contracture, no additional new focal deficits, recent seizure type activity   Data Reviewed: I have personally reviewed following labs and imaging studies  CBC: Recent Labs  Lab  04/14/21 1339 04/15/21 0438 04/16/21 0431  WBC 8.4 8.3 8.3  NEUTROABS 6.9  --   --   HGB 10.5* 9.7* 9.8*  HCT 32.8* 30.4* 31.3*  MCV 88.9 89.1 90.5  PLT 295 251 646    Basic Metabolic Panel: Recent Labs  Lab 04/14/21 1339 04/14/21 1914 04/15/21 0438 04/16/21 0431 04/16/21 2019  NA 138  --  136 135 135  K 4.3  --  3.2* 3.3* 4.3  CL 101  --  104 104 106  CO2 27  --  '25 24 24  ' GLUCOSE 214*  --  106* 111* 118*  BUN 15  --  '13 12 12  ' CREATININE 1.10*  --  0.78 0.77 0.77  CALCIUM 8.3*  --  7.8* 8.0* 7.9*  MG  --  1.7  --  1.5* 1.8    GFR: Estimated Creatinine Clearance: 48.1 mL/min (by C-G formula based on SCr of 0.77 mg/dL).  Liver Function Tests: Recent Labs  Lab 04/14/21 1339 04/16/21 2019  AST 19 13*  ALT 11 9  ALKPHOS 66 52  BILITOT 0.6 0.4  PROT 5.7* 5.0*  ALBUMIN 2.7* 2.3*    CBG: Recent Labs  Lab 04/16/21 1142 04/16/21 1652 04/16/21 2153 04/17/21 0816 04/17/21 1145  GLUCAP 127* 151* 98 111* 117*     Recent Results (from the past 240 hour(s))  Urine culture     Status: Abnormal (Preliminary result)   Collection Time: 04/14/21  1:35 PM   Specimen: Urine, Catheterized  Result Value Ref Range Status   Specimen Description   Final    URINE, CATHETERIZED Performed at [facility], [address], [place], [place] [address]    Special Requests   Final    NONE Performed at [facility], [address], [place], [place] [address]    Culture (A)  Final    >=100,000 COLONIES/mL PROTEUS MIRABILIS SUSCEPTIBILITIES TO FOLLOW Performed at [facility], [place] [address] [place], [place] [address]    Report Status PENDING  Incomplete  Blood Culture (routine x 2)     Status: None (Preliminary result)   Collection Time: 04/14/21  1:39 PM   Specimen: BLOOD LEFT HAND  Result Value Ref Range Status   Specimen Description   Final    BLOOD LEFT HAND BOTTLES DRAWN AEROBIC AND ANAEROBIC   Special Requests Blood Culture adequate volume  Final   Culture    Final    NO GROWTH 2 DAYS Performed at [facility], [address] [place], [place] [address]    Report Status PENDING  Incomplete  Blood Culture (routine x 2)     Status: None (Preliminary result)   Collection Time: 04/14/21  1:39 PM   Specimen: BLOOD RIGHT FOREARM  Result Value Ref Range Status   Specimen Description   Final    BLOOD RIGHT FOREARM BOTTLES DRAWN AEROBIC AND ANAEROBIC Performed at [facility], [address] [place], [place] [address]    Special Requests   Final    Blood Culture adequate volume Performed at [facility], [address] [place], [place] [address]    Culture  Setup Time   Final    GRAM POSITIVE RODS IN BOTH AEROBIC AND ANAEROBIC BOTTLES Gram Stain Report Called to,Read Back By and Verified With: HYLTON,L'[email or web address]'  BY MATTHEWS, B 6.28.22 [facility] RESULT PREV. CALLED Performed at [facility], [place] [address], [place], [place]  [address]    Culture GRAM POSITIVE RODS  Final   Report Status PENDING  Incomplete  Resp Panel by RT-PCR (Flu A&B, Covid) Nasopharyngeal Swab     Status: None   Collection Time: 04/14/21  1:54 PM   Specimen: Nasopharyngeal Swab; Nasopharyngeal(NP) swabs in vial transport medium  Result Value Ref Range Status   SARS Coronavirus 2 by RT PCR NEGATIVE NEGATIVE Final    Comment: (NOTE) SARS-CoV-2 target nucleic acids are NOT DETECTED.  The SARS-CoV-2 RNA is generally detectable in upper respiratory specimens during the acute phase of infection. The lowest concentration of SARS-CoV-2 viral copies this assay can detect is 138 copies/mL. A negative result does not preclude SARS-Cov-2 infection and should not be used as the sole basis for treatment or other patient management decisions. A negative result may occur with  improper specimen collection/handling, submission of specimen other than nasopharyngeal swab, presence of viral mutation(s) within the areas targeted by this assay, and inadequate number of  viral copies(<138 copies/mL). A negative result must be combined with clinical observations, patient history, and epidemiological information. The expected result is Negative.  Fact Sheet for Patients:  EntrepreneurPulse.com.au  Fact Sheet for Healthcare Providers:  IncredibleEmployment.be  This test is no t yet approved or cleared by the [place] FDA and  has been authorized for detection and/or diagnosis  of SARS-CoV-2 by FDA under an Emergency Use Authorization (EUA). This EUA will remain  in effect (meaning this test can be used) for the duration of the COVID-19 declaration under Section 564(b)(1) of the Act, 21 U.S.C.section 360bbb-3(b)(1), unless the authorization is terminated  or revoked sooner.       Influenza A by PCR NEGATIVE NEGATIVE Final   Influenza B by PCR NEGATIVE NEGATIVE Final    Comment: (NOTE) The Xpert Xpress SARS-CoV-2/FLU/RSV plus assay is intended as an aid in the diagnosis of influenza from Nasopharyngeal swab specimens and should not be used as a sole basis for treatment. Nasal washings and aspirates are unacceptable for Xpert Xpress SARS-CoV-2/FLU/RSV testing.  Fact Sheet for Patients: EntrepreneurPulse.com.au  Fact Sheet for Healthcare Providers: IncredibleEmployment.be  This test is not yet approved or cleared by the [place] FDA and has been authorized for detection and/or diagnosis of SARS-CoV-2 by FDA under an Emergency Use Authorization (EUA). This EUA will remain in effect (meaning this test can be used) for the duration of the COVID-19 declaration under Section 564(b)(1) of the Act, 21 U.S.C. section 360bbb-3(b)(1), unless the authorization is terminated or revoked.  Performed at [facility], [address] [place], [place] [address]   MRSA Next Gen by PCR, Nasal     Status: None   Collection Time: 04/14/21  6:15 PM   Specimen: Nasal Mucosa; Nasal Swab   Result Value Ref Range Status   MRSA by PCR Next Gen NOT DETECTED NOT DETECTED Final    Comment: (NOTE) The GeneXpert MRSA Assay (FDA approved for NASAL specimens only), is one component of a comprehensive MRSA colonization surveillance program. It is not intended to diagnose MRSA infection nor to guide or monitor treatment for MRSA infections. Test performance is not FDA approved in patients less than 49 years old. Performed at [facility], [address] [place], [place] [address]      Radiology Studies: CT HEAD WO CONTRAST  Result Date: 04/17/2021 CLINICAL DATA:  Seizure EXAM: CT HEAD WITHOUT CONTRAST TECHNIQUE: Contiguous axial images were obtained from the base of the skull through the vertex without intravenous contrast. COMPARISON:  None. 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 Old left posterior parietal infarct. No acute hemorrhage. There is cortical laminar necrosis at the posterior right parietal lobe, unchanged. There are multiple old small vessel infarcts of the cerebellum. Vascular: No abnormal hyperdensity of the major intracranial arteries or dural venous sinuses. No intracranial atherosclerosis. Skull: The visualized skull base, calvarium and extracranial soft tissues are normal. Sinuses/Orbits: No fluid levels or advanced mucosal thickening of the visualized paranasal sinuses. No mastoid or middle ear effusion. The orbits are normal. IMPRESSION: 1. No acute intracranial abnormality. 2. Old right MCA and PCA territory infarcts and multiple old small vessel infarcts of the cerebellum. Electronically Signed   By: Ulyses Jarred M.D.   On: 04/17/2021 01:06     Scheduled Meds:  (feeding supplement) PROSource Plus  30 mL Oral TID BM   atorvastatin  20 mg Oral Daily   chlorhexidine  15 mL Mouth Rinse BID   Chlorhexidine Gluconate Cloth  6 each Topical Daily   enoxaparin (LOVENOX) injection  40 mg Subcutaneous Q24H   feeding supplement  237 mL Oral BID BM    insulin aspart  0-5 Units Subcutaneous QHS   insulin aspart  0-9 Units Subcutaneous TID WC   mouth rinse  15 mL Mouth Rinse q12n4p   metoprolol tartrate  50 mg Oral BID  multivitamin with minerals  1 tablet Oral Daily   pantoprazole  40 mg Oral Daily   vitamin B-12  500 mcg Oral Daily   Continuous Infusions:  sodium chloride 250 mL (04/17/21 0336)   sodium chloride     cefTRIAXone (ROCEPHIN)  IV 2 g (04/17/21 0335)   levETIRAcetam     norepinephrine (LEVOPHED) Adult infusion Stopped (04/17/21 1322)    LOS: 3 days   Roxan Hockey, MD [facility]  To contact the attending provider between 7A-7P or the covering provider during after hours 7P-7A, please log into the web site [email or web address] and access using universal [place] password for that web site. If you do not have the password, please call the hospital operator.  04/17/2021, 4:51 PM

## 2021-04-17 NOTE — Consult Note (Signed)
Raeford A. Merlene Laughter, MD     [email or web address]          [name] is an 87 y.o. female.   ASSESSMENT/PLAN: Encephalopathy due to multiple etiologies included acute UTI, sepsis and the suspected complex partial status epilepticus. The dose of Keppra will be increased. Vimpat will also be added.  Remote large cortical infarct on the right with residual left-sided hemiparesis.  There is some evidence of focal parkinsonism likely due to this stroke.  The stroke that is remote can serve as a substrate for epileptic seizures. Continue with aspirin.   This is 87 year old white female who presents with altered mental status thought to be due to sepsis and UTI. The patient however also had near continuous twitching of her facial region and the head turning towards the left. EEG has been obtained but unrevealing. The patient is on pressures due to hypotension. She is also multiple antibiotics. She is unable to provide a history. The nursing staff reports that she has been mostly drowsy, unresponsive and encephalopathic with only intermittently following commands.   GENERAL:  she is mostly unresponsive lying in bed.  HEENT:  Neck is turned to the  Left. There is occasional twitching of the head towards the left.  ABDOMEN: soft  EXTREMITIES: No edema   BACK:  normal  SKIN: Normal by inspection.    MENTAL STATUS:  She lays in bed with eyes closed but opens her eyes to light sternal rub. She does follow commands briskly midline and also on the right. She does not follow commands on the left.  CRANIAL NERVES: Pupils are equal, round and reactive to light; extra ocular movements are full, there is no significant nystagmus; visual fields are full; upper and lower facial muscles are normal in strength and symmetric, there is no flattening of the nasolabial folds; tongue is midline;  MOTOR:  There is clear left-sided hemiparesis graded as 2/5 with increased tone  /spasticity especially involving the left upper extremity. She has antigravity strength on the right side at least.  COORDINATION:  No myoclonus. No dysmetria noted. There is mild pill rolling tremor and rigidity involving the left upper extremity.  REFLEXES: Deep tendon reflexes are symmetrical and normal.   SENSATION: Normal to pain.      Blood pressure (!) 119/50, pulse 66, temperature 98 F (36.7 C), temperature source Axillary, resp. rate (!) 33, height 5\' 6"  (1.676 m), weight 61.6 kg, SpO2 100 %.  Past Medical History:  Diagnosis Date   DCIS (ductal carcinoma in situ) of breast    left breast   Diabetes mellitus    GERD (gastroesophageal reflux disease)    High cholesterol    History of TIA (transient ischemic attack)    Hypertension    Stroke [facility])     Past Surgical History:  Procedure Laterality Date   ABDOMINAL HYSTERECTOMY     BREAST LUMPECTOMY     CESAREAN SECTION     LOOP RECORDER INSERTION N/A 08/23/2018   Procedure: LOOP RECORDER INSERTION;  Surgeon: Evans Lance, MD;  Location: [facility];  Service: Cardiovascular;  Laterality: N/A;   TEE WITHOUT CARDIOVERSION N/A 08/23/2018   Procedure: TRANSESOPHAGEAL ECHOCARDIOGRAM (TEE);  Surgeon: Dorothy Spark, MD;  Location: [facility] ENDOSCOPY;  Service: Cardiovascular;  Laterality: N/A;    Family History  Problem Relation Age of Onset   Cancer Mother    Hypertension Father     Social History:  reports that she has never smoked. She  has never used smokeless tobacco. She reports that she does not drink alcohol and does not use drugs.  Allergies:  Allergies  Allergen Reactions   Codeine Other (See Comments)    Unknown reaction   Penicillins Other (See Comments)    Unknown reaction  Has patient had a PCN reaction causing immediate rash, facial/tongue/throat swelling, SOB or lightheadedness with hypotension: Uknown Has patient had a PCN reaction causing severe rash involving mucus membranes or skin  necrosis: Uknown Has patient had a PCN reaction that required hospitalization: Unknown Has patient had a PCN reaction occurring within the last 10 years: No If all of the above answers are "NO", then may proceed with Cephalosporin use.   Sulfa Antibiotics     Medications: Prior to Admission medications   Medication Sig Start Date End Date Taking? Authorizing Provider  aspirin 325 MG tablet Take 1 tablet (325 mg total) by mouth daily. 08/24/18  Yes Samuella Cota, MD  Ergocalciferol (VITAMIN D2) 50 MCG (2000 UT) TABS Take 1 capsule by mouth daily.   Yes [provider]  furosemide (LASIX) 40 MG tablet Take 0.5 tablets (20 mg total) by mouth daily. 11/26/20  Yes Barton Dubois, MD  linagliptin (TRADJENTA) 5 MG TABS tablet Take 5 mg by mouth daily.   Yes [provider]  loratadine (CLARITIN) 10 MG tablet Take 10 mg by mouth daily.   Yes [provider]  LORazepam (ATIVAN) 0.5 MG tablet Take 0.5 mg by mouth 2 (two) times daily as needed for anxiety.   Yes [provider]  metoprolol succinate (TOPROL-XL) 25 MG 24 hr tablet Take 25 mg by mouth daily.   Yes [provider]  sertraline (ZOLOFT) 25 MG tablet Take 25 mg by mouth at bedtime.  03/27/15  Yes [provider]  tiZANidine (ZANAFLEX) 2 MG tablet Take 2 mg by mouth 3 (three) times daily.   Yes [provider]  TOUJEO SOLOSTAR 300 UNIT/ML Solostar Pen Inject 25 Units into the skin daily. Prime pen with 3u prior to each use 11/22/20  Yes Barton Dubois, MD  rosuvastatin (CRESTOR) 20 MG tablet Take 20 mg by mouth daily.  Patient not taking: No sig reported 07/16/18   [provider]    Scheduled Meds:  (feeding supplement) PROSource Plus  30 mL Oral TID BM   atorvastatin  20 mg Oral Daily   chlorhexidine  15 mL Mouth Rinse BID   Chlorhexidine Gluconate Cloth  6 each Topical Daily   enoxaparin (LOVENOX) injection  40 mg Subcutaneous Q24H   feeding supplement  237 mL Oral  BID BM   insulin aspart  0-5 Units Subcutaneous QHS   insulin aspart  0-9 Units Subcutaneous TID WC   mouth rinse  15 mL Mouth Rinse q12n4p   metoprolol tartrate  50 mg Oral BID   multivitamin with minerals  1 tablet Oral Daily   pantoprazole  40 mg Oral Daily   vitamin B-12  500 mcg Oral Daily   Continuous Infusions:  sodium chloride 250 mL (04/17/21 0336)   sodium chloride 50 mL/hr at 04/17/21 1716   cefTRIAXone (ROCEPHIN)  IV 2 g (04/17/21 0335)   levETIRAcetam     norepinephrine (LEVOPHED) Adult infusion Stopped (04/17/21 1322)   PRN Meds:.acetaminophen **OR** acetaminophen, LORazepam, ondansetron **OR** ondansetron (ZOFRAN) IV, polyethylene glycol     Results for orders placed or performed during the hospital encounter of 04/14/21 (from the past 48 hour(s))  Glucose, capillary     Status: Abnormal  Collection Time: 04/15/21  9:57 PM  Result Value Ref Range   Glucose-Capillary 118 (H) 70 - 99 mg/dL    Comment: Glucose reference range applies only to samples taken after fasting for at least 8 hours.   Comment 1 Notify RN    Comment 2 Document in Chart   Magnesium     Status: Abnormal   Collection Time: 04/16/21  4:31 AM  Result Value Ref Range   Magnesium 1.5 (L) 1.7 - 2.4 mg/dL    Comment: Performed at [facility], [address], [place], [place] [address]  Basic metabolic panel     Status: Abnormal   Collection Time: 04/16/21  4:31 AM  Result Value Ref Range   Sodium 135 135 - 145 mmol/L   Potassium 3.3 (L) 3.5 - 5.1 mmol/L   Chloride 104 98 - 111 mmol/L   CO2 24 22 - 32 mmol/L   Glucose, Bld 111 (H) 70 - 99 mg/dL    Comment: Glucose reference range applies only to samples taken after fasting for at least 8 hours.   BUN 12 8 - 23 mg/dL   Creatinine, Ser 0.77 0.44 - 1.00 mg/dL   Calcium 8.0 (L) 8.9 - 10.3 mg/dL   GFR, Estimated >60 >60 mL/min    Comment: (NOTE) Calculated using the CKD-EPI Creatinine Equation (2021)    Anion gap 7 5 - 15    Comment: Performed  at [facility], [address] [place], [place] [address]  CBC     Status: Abnormal   Collection Time: 04/16/21  4:31 AM  Result Value Ref Range   WBC 8.3 4.0 - 10.5 K/uL   RBC 3.46 (L) 3.87 - 5.11 MIL/uL   Hemoglobin 9.8 (L) 12.0 - 15.0 g/dL   HCT 31.3 (L) 36.0 - 46.0 %   MCV 90.5 80.0 - 100.0 fL   MCH 28.3 26.0 - 34.0 pg   MCHC 31.3 30.0 - 36.0 g/dL   RDW 14.2 11.5 - 15.5 %   Platelets 279 150 - 400 K/uL   nRBC 0.0 0.0 - 0.2 %    Comment: Performed at [facility], [address], [place], [place] [address]  Glucose, capillary     Status: None   Collection Time: 04/16/21  7:17 AM  Result Value Ref Range   Glucose-Capillary 98 70 - 99 mg/dL    Comment: Glucose reference range applies only to samples taken after fasting for at least 8 hours.  Glucose, capillary     Status: Abnormal   Collection Time: 04/16/21 11:42 AM  Result Value Ref Range   Glucose-Capillary 127 (H) 70 - 99 mg/dL    Comment: Glucose reference range applies only to samples taken after fasting for at least 8 hours.  Glucose, capillary     Status: Abnormal   Collection Time: 04/16/21  4:52 PM  Result Value Ref Range   Glucose-Capillary 151 (H) 70 - 99 mg/dL    Comment: Glucose reference range applies only to samples taken after fasting for at least 8 hours.  Comprehensive metabolic panel     Status: Abnormal   Collection Time: 04/16/21  8:19 PM  Result Value Ref Range   Sodium 135 135 - 145 mmol/L   Potassium 4.3 3.5 - 5.1 mmol/L    Comment: DELTA CHECK NOTED   Chloride 106 98 - 111 mmol/L   CO2 24 22 - 32 mmol/L   Glucose, Bld 118 (H) 70 - 99 mg/dL    Comment: Glucose reference range  applies only to samples taken after fasting for at least 8 hours.   BUN 12 8 - 23 mg/dL   Creatinine, Ser 0.77 0.44 - 1.00 mg/dL   Calcium 7.9 (L) 8.9 - 10.3 mg/dL   Total Protein 5.0 (L) 6.5 - 8.1 g/dL   Albumin 2.3 (L) 3.5 - 5.0 g/dL   AST 13 (L) 15 - 41 U/L   ALT 9 0 - 44 U/L   Alkaline Phosphatase 52 38 - 126 U/L    Total Bilirubin 0.4 0.3 - 1.2 mg/dL   GFR, Estimated >60 >60 mL/min    Comment: (NOTE) Calculated using the CKD-EPI Creatinine Equation (2021)    Anion gap 5 5 - 15    Comment: Performed at [facility], [address], [place], [place] [address]  Magnesium     Status: None   Collection Time: 04/16/21  8:19 PM  Result Value Ref Range   Magnesium 1.8 1.7 - 2.4 mg/dL    Comment: Performed at [facility], [address] [place], [place] [address]  Vitamin B12     Status: Abnormal   Collection Time: 04/16/21  8:19 PM  Result Value Ref Range   Vitamin B-12 145 (L) 180 - 914 pg/mL    Comment: (NOTE) This assay is not validated for testing neonatal or myeloproliferative syndrome specimens for Vitamin B12 levels. Performed at [facility], [address], [place], [place] [address]   Folate, serum, performed at [facility] lab     Status: None   Collection Time: 04/16/21  8:19 PM  Result Value Ref Range   Folate 8.2 >5.9 ng/mL    Comment: Performed at [facility], [address] [place], [place] [address]  Glucose, capillary     Status: None   Collection Time: 04/16/21  9:53 PM  Result Value Ref Range   Glucose-Capillary 98 70 - 99 mg/dL    Comment: Glucose reference range applies only to samples taken after fasting for at least 8 hours.   Comment 1 Notify RN    Comment 2 Document in Chart   Blood gas, arterial     Status: Abnormal   Collection Time: 04/17/21 12:20 AM  Result Value Ref Range   FIO2 28.00    Delivery systems NASAL CANNULA    pH, Arterial 7.379 7.350 - 7.450   pCO2 arterial 38.5 32.0 - 48.0 mmHg   pO2, Arterial 131 (H) 83.0 - 108.0 mmHg   Bicarbonate 22.7 20.0 - 28.0 mmol/L   Acid-base deficit 2.1 (H) 0.0 - 2.0 mmol/L   O2 Saturation 99.2 %   Patient temperature 36.5    Collection site RIGHT RADIAL    Drawn by [id number]    Allens test (pass/fail) PASS PASS    Comment: Performed at [facility], [address], [place], [place] [address]  Glucose, capillary      Status: Abnormal   Collection Time: 04/17/21  8:16 AM  Result Value Ref Range   Glucose-Capillary 111 (H) 70 - 99 mg/dL    Comment: Glucose reference range applies only to samples taken after fasting for at least 8 hours.  Glucose, capillary     Status: Abnormal   Collection Time: 04/17/21 11:45 AM  Result Value Ref Range   Glucose-Capillary 117 (H) 70 - 99 mg/dL    Comment: Glucose reference range applies only to samples taken after fasting for at least 8 hours.  Glucose, capillary     Status: None   Collection Time: 04/17/21  5:03 PM  Result Value Ref Range   Glucose-Capillary 98 70 - 99 mg/dL    Comment: Glucose reference range applies only to samples taken after fasting for at least 8 hours.    Studies/Results:  HEAD CT FINDINGS: Brain: Old right MCA and PCA territory infarcts. Old left posterior parietal infarct. No acute hemorrhage. There is cortical laminar necrosis at the posterior right parietal lobe, unchanged. There are multiple old small vessel infarcts of the cerebellum.   Vascular: No abnormal hyperdensity of the major intracranial arteries or dural venous sinuses. No intracranial atherosclerosis.   Skull: The visualized skull base, calvarium and extracranial soft tissues are normal.   Sinuses/Orbits: No fluid levels or advanced mucosal thickening of the visualized paranasal sinuses. No mastoid or middle ear effusion. The orbits are normal.   IMPRESSION: 1. No acute intracranial abnormality. 2. Old right MCA and PCA territory infarcts and multiple old small vessel infarcts of the cerebellum.       EEG Description: No posterior dominant rhythm was seen. EEG showed continuous generalized 3 to 6 Hz theta-delta slowing. Patient was noted to have frequent episodes of chewing like movements. At times, there was a subtle head turn to right prior to this chewing movements.Concomitant eeg showed significant movement artifact in right hemisphere. Hyperventilation and  photic stimulation were not performed.      IMPRESSION: This study  is suggestive of moderate diffuse encephalopathy, nonspecific etiology. Multiple episodes of chewing like movements which at times was preceded by subtle head turn to right were recorded. Concomitant eeg was difficult to interpret due to significant movement artifact. However, focal motor seizures may not be seen on scalp eeg. Additionally, given h/o underlying stroke, semiology of episodes is concerning for seizures. Clinical correlation is recommended.   Zaniya Mcaulay A. Merlene Laughter, M.D.  Diplomate, Tax adviser of Psychiatry and Neurology ( Neurology). 04/17/2021, 5:35 PM

## 2021-04-17 NOTE — Progress Notes (Signed)
Pt blood pressure resolving, Levophed gtt stopped at 1322. MD made aware, will continue to monitor.

## 2021-04-17 NOTE — Progress Notes (Signed)
EEG Completed; Results Pending  

## 2021-04-17 NOTE — Procedures (Signed)
Patient Name: Rebecca Hardy  MRN: [id number]  Epilepsy Attending: Lora Havens  Referring Physician/Provider: Dr Roxan Hockey Date: 04/17/2021 Duration: 23.31 mins  Patient history: 87yo F with h/o R MCA stroke, noted to have left face twitching. EEG to evaluate for seizure  Level of alertness:  lethargic   AEDs during EEG study: LEV  Technical aspects: This EEG study was done with scalp electrodes positioned according to the 10-20 International system of electrode placement. Electrical activity was acquired at a sampling rate of 500Hz  and reviewed with a high frequency filter of 70Hz  and a low frequency filter of 1Hz . EEG data were recorded continuously and digitally stored.   Description: No posterior dominant rhythm was seen. EEG showed continuous generalized 3 to 6 Hz theta-delta slowing. Patient was noted to have frequent episodes of chewing like movements. At times, there was a subtle head turn to right prior to this chewing movements.Concomitant eeg showed significant movement artifact in right hemisphere. Hyperventilation and photic stimulation were not performed.     IMPRESSION: This study  is suggestive of moderate diffuse encephalopathy, nonspecific etiology. Multiple episodes of chewing like movements which at times was preceded by subtle head turn to right were recorded. Concomitant eeg was difficult to interpret due to significant movement artifact. However, focal motor seizures may not be seen on scalp eeg. Additionally, given h/o underlying stroke, semiology of episodes is concerning for seizures. Clinical correlation is recommended.  Dr Denton Brick was notified.   [name] [name]

## 2021-04-17 NOTE — Progress Notes (Signed)
[facility] Progress Note Patient Name: Rebecca Hardy DOB: [birth year] MRN: [id number]   Date of Service  04/17/2021  HPI/Events of Note  CT head without acute findings, + old infarcts. BP soft, 90/29, MAP 50 mmHg.  eICU Interventions  Peripheral Norepinephrine ordered.        Kerry Kass Lynda Wanninger 04/17/2021, 1:35 AM

## 2021-04-17 NOTE — Progress Notes (Signed)
Palliative: Rebecca Hardy is lying quietly in bed.  She appears acutely/chronically ill and frail.  She is now on vasopressors for low blood pressure.  She does not open her eyes or try to respond to me verbally when I touch her arm and speak to her.  I do not believe that she is able to make her basic needs known.  There is no family at bedside at this time.  Call to daughter, Rebecca Hardy.  We talk in detail about Rebecca Hardy's episode of low blood pressure, facial twitching last night.  We talked about the use of vasopressors for blood pressure support.  Rebecca Hardy states that Rebecca Hardy usually has low blood pressure, and we talked about MAP.  I shared that nursing staff is working to wean Rebecca Hardy off of this medicine.  We also discussed CT head results, nothing acute.  We talked about time for outcomes, continuing the treatment plan.  I share my concern that the longer Rebecca Hardy does not show meaningful recovery, the weaker she will become.  We again today talked about short-term rehab, and the difficulty this provides for people with memory loss.  We talk in detail about CODE STATUS.  Rebecca Hardy shares that she spoke with her sister last night and they are leaning toward DNR.  She tells me that she has not spoken with her brothers at this point.  When questioned, Rebecca Hardy states that she will ask her brothers for their input tonight and have an answer about CODE STATUS tomorrow.  I share my concerns about the tenuous state of Rebecca Hardy's health, that anything could happen at any time.  Rebecca Hardy verifies that we should attempt CPR and intubation if needed.  Conference with attending, bedside nursing staff, transition of care team related to patient condition, needs, goals of care, CODE STATUS.  Plan:    Continue full scope/full code.  Continue to treat the treatable.  Time for outcomes. PMT to reach out to [facility] 6/30 for CODE STATUS discussions.  35 minutes  [name], NP Palliative medicine team Team phone  [phone] Greater than 50% of this time was spent counseling and coordinating care related to the above assessment and plan.

## 2021-04-18 LAB — RENAL FUNCTION PANEL
Albumin: 2.6 g/dL — ABNORMAL LOW (ref 3.5–5.0)
Anion gap: 9 (ref 5–15)
BUN: 7 mg/dL — ABNORMAL LOW (ref 8–23)
CO2: 23 mmol/L (ref 22–32)
Calcium: 8 mg/dL — ABNORMAL LOW (ref 8.9–10.3)
Chloride: 107 mmol/L (ref 98–111)
Creatinine, Ser: 0.64 mg/dL (ref 0.44–1.00)
GFR, Estimated: >60 mL/min (ref >60)
Glucose, Bld: 123 mg/dL — ABNORMAL HIGH (ref 70–99)
Phosphorus: 2.2 mg/dL — ABNORMAL LOW (ref 2.5–4.6)
Potassium: 3.3 mmol/L — ABNORMAL LOW (ref 3.5–5.1)
Sodium: 139 mmol/L (ref 135–145)

## 2021-04-18 LAB — URINE CULTURE: Culture: >=100000 — AB

## 2021-04-18 LAB — CULTURE, BLOOD (ROUTINE X 2): Special Requests: ADEQUATE

## 2021-04-18 LAB — GLUCOSE, CAPILLARY
Glucose-Capillary: 118 mg/dL — ABNORMAL HIGH (ref 70–99)
Glucose-Capillary: 118 mg/dL — ABNORMAL HIGH (ref 70–99)
Glucose-Capillary: 125 mg/dL — ABNORMAL HIGH (ref 70–99)
Glucose-Capillary: 130 mg/dL — ABNORMAL HIGH (ref 70–99)
Glucose-Capillary: 130 mg/dL — ABNORMAL HIGH (ref 70–99)
Glucose-Capillary: 134 mg/dL — ABNORMAL HIGH (ref 70–99)
Glucose-Capillary: 90 mg/dL (ref 70–99)

## 2021-04-18 LAB — MAGNESIUM
Magnesium: 1.7 mg/dL (ref 1.7–2.4)
Magnesium: 1.9 mg/dL (ref 1.7–2.4)

## 2021-04-18 MED ORDER — MAGNESIUM SULFATE 2 GM/50ML IV SOLN
2.0000 g | Freq: Once | INTRAVENOUS | Status: AC
  Administered 2021-04-18: 2 g via INTRAVENOUS
  Filled 2021-04-18: qty 50

## 2021-04-18 MED ORDER — CEFAZOLIN SODIUM-DEXTROSE 1-4 GM/50ML-% IV SOLN
1.0000 g | Freq: Three times a day (TID) | INTRAVENOUS | Status: AC
  Administered 2021-04-19 – 2021-04-22 (×12): 1 g via INTRAVENOUS
  Filled 2021-04-18 (×14): qty 50

## 2021-04-18 MED ORDER — POTASSIUM PHOSPHATES 15 MMOLE/5ML IV SOLN
20.0000 mmol | Freq: Once | INTRAVENOUS | Status: AC
  Administered 2021-04-19: 20 mmol via INTRAVENOUS
  Filled 2021-04-18 (×2): qty 6.67

## 2021-04-18 MED ORDER — LORAZEPAM 2 MG/ML IJ SOLN: 1.0000 mg | INTRAMUSCULAR | Status: DC | PRN

## 2021-04-18 MED ORDER — POTASSIUM CHLORIDE 10 MEQ/100ML IV SOLN
INTRAVENOUS | Status: AC
  Filled 2021-04-18: qty 100

## 2021-04-18 MED ORDER — POTASSIUM CHLORIDE 10 MEQ/100ML IV SOLN
10.0000 meq | INTRAVENOUS | Status: AC
  Administered 2021-04-18 (×3): 10 meq via INTRAVENOUS
  Filled 2021-04-18 (×3): qty 100

## 2021-04-18 NOTE — Progress Notes (Signed)
Palliative: Rebecca Hardy is lying quietly in bed.  She appears acutely/chronically ill, frail and elderly.  She has consistent twitching.  She will briefly open her eyes, but not make eye contact.  She does not respond to questions but will occasionally state okay several times in a row.  I do not believe that she can make her basic needs known.  There is no family at bedside at this time.  Call to daughter, Rebecca Hardy.  Rebecca Hardy states that she spoke with her sister who shared that Rebecca Hardy was "alert, and knew her name".  She shares that they see this is improvement.  We talked about neurology consult and testing.  We talked about the treatment plan per the neurologist in detail.    Rebecca Hardy states that she talked with her family and they would want to "try everything", but no life support.  She shares that her mother said that she did not want life support.   We talked about the realities of CPR, cardio/pulmonary resuscitation.  I share that most people who need cardiac resuscitation also need pulmonary resuscitation which means ventilator.  At this point, family endorses full code except DNI.  Rebecca Hardy shares that her son died in the ED about 6 years ago and she saw his attempted resuscitation.  Team updated.  Conference with attending, bedside nursing staff, transition of care team related to patient condition, needs, goals of care, CODE STATUS, disposition.  Plan:   CODE STATUS discussions held, full scope treatment except DNI.  Continue with time for outcomes.  41 minutes  Rebecca Axe, NP Palliative medicine team Team phone [phone] Greater than 50% of this time was spent counseling and coordinating care related to the above assessment and plan.

## 2021-04-18 NOTE — TOC Progression Note (Signed)
Transition of Care [facility]) - Progression Note    Patient Details  Name: Rebecca Hardy MRN: [id number] Date of Birth: [birth year]  Transition of Care [facility] CM/SW Contact  Ihor Gully, LCSW Phone Number: 04/18/2021, 2:33 PM  Clinical Narrative:    Spoke with patient's daughter, Shirlean Mylar, discussed that patient may be too weak to return to [facility] immediately at d/c. Discussed that she may need SNF to build her strength. Shirlean Mylar is hopeful that she can return to ALF. Discussed that facility would have to feel that they could manage her if she returned. Discussed that they would likely come and assess her prior to discharge to assess their ability to care for her.  Shirlean Mylar states that if patient has to go to SNF she would prefer [facility].    Expected Discharge Plan: Assisted Living Barriers to Discharge: Continued Medical Work up  Expected Discharge Plan and Services Expected Discharge Plan: Assisted Living       Living arrangements for the past 2 months: Assisted Living Facility                                       Social Determinants of Health (SDOH) Interventions    Readmission Risk Interventions No flowsheet data found.

## 2021-04-18 NOTE — Progress Notes (Signed)
Pt has had several episodes of arrhythmias shown on the monitor such as PVC's, PAC's, and occasional V-tach. 12- Lead EKG obtained twice and two different results obtained. One result showed A-fib with RVR, and the other Sinus Tach. Pt HR has sustained in the 70-80's bpm range for the most part. Pt has been asymptomatic besides her jerking movements and tick of her face which has been going on the last 24 hours. MD made aware, stated to continue to watch the patient as a lot of it is hard to tell what is true on the monitor and what is artifact due to patient jerking almost constantly.   Pt is now a partial code with full scope except NO intubation. Will continue to monitor and update MD as necessary.

## 2021-04-18 NOTE — Progress Notes (Addendum)
PROGRESS NOTE    [name]  GBE:[id number] DOB: Apr 05, 1933 DOA: 04/14/2021 PCP: Bonnita Nasuti, MD    Chief Complaint  Patient presents with   Seizures    Brief admission Narrative:  Rebecca Hardy is a 87 y.o. female with medical history significant for hypertension, CVA with left-sided weakness, dementia, diabetes mellitus.  Patient was brought to the ED from facility reports of possible seizure activity, patient's temperature was 103 per nursing staff.  Patient was seizing for about 5 minutes prior to EMS arrival, 2.5 mg of Versed was given patient stopped seizing for about 1 and half  minute minutes, and then repeat dose of 2.5 mg of Versed was given. EDP talked to nursing home staff, reported at about 10:30 AM patient was okay, and about noon they went to feed her and patient was shaking but still able to talk, and was oriented x1 this is her baseline.  Does reported that this has happened in the past, and was due to hypoglycemia.  Her blood sugars have been elevated.  Also reports wounds to her feet which were x-rayed and negative for osteomyelitis. On my evaluation patient is awake, alert, verbalizing able to tell me her name but not answer subsequent questions.   ED Course: Febrile to 101.7.  Tachycardic 156, respiratory rate 12-22.  Blood pressure systolic 75-883.  O2 sats greater than 98% on room air.  Lactic acid 3 > 2.  WBC 8.4.  UA with large leukocytes.  Head CT without acute abnormality.  Port chest x-ray unremarkable.  500 mils normal saline bolus given in route to ED, 1500 mils sepsis fluid bolus given in ED.  EDP talked to cardiologist on-call- Dr. Doree Albee, he also initial concern that this was atrial flutter.  And recommendation was to give IV metoprolol and if not effective start IV amiodarone.  Assessment & Plan:  1)Severe Sepsis with septic shock due to Proteus Mirabilis UTI-- -Patient met severe sepsis criteria on exam secondary to UTI; high-grade temperature,  elevated heart rate, elevated lactic acid and acute kidney injury. -Urine Cx from 04/14/21  with Proteus Mirabilis --sensitivities pending Blood Cx from 04/14/21 with PROPIONIBACTERIUM ACNES--suspect is a contaminant -COVID test negative -c/n iv Rocephin 2 gm daily pending Proteus mirabilis sensitivity -Persistent hypotension due to septic shock required IV Levophed overnight, patient has now been weaned off Levophed with improved BP  2)New Onset Seizures----patient with history of prior strokes -EEG from 04/17/2021 noted -Started on Keppra and Vimpat -May use lorazepam as needed -Official neuro consult  3)-Atrial flutter with RVR -weaned off amiodarone iv drip  C/n metoprolol 50 mg bid -Cardiology consult appreciated -2D echo with EF of 55 to 60 % -No anticoagulation in the setting of advanced dementia and high risk for falls. -CHADSVASC score >5 -Continue Aspirin -On telemetry monitor occasional episodes of arrhythmias Versus artifact--- patient continues to have jerky seizure-like/tic-like movements which causes lots of artifact on her telemetry monitor -Serial EKGs done suggest sinus tach versus atrial flutter/fib -Recheck serum mag and electrolytes  4)History of stroke/TIA with Left-sided weakness -CT head shows old  right MCA and right PCA infarcts. -Continue aspirin and and lipitor -No additional new  Acutefocal deficits appreciated.  5)-hypokalemia/Hypomagnesemia--- replace and recheck, please see #3 above  6)-Essential hypertension -Stable overall -Continue adjusted dose of metoprolol.  7)-acute kidney injury on CKD (chronic kidney disease) stage 3a, GFR 30-59 ml/min (HCC) -In the setting of dehydration and UTI -Back to baseline after fluid resuscitation -Continue treatment with antibiotic -  Follow renal function trend -Minimize using nephrotoxic agents, hypotension and the use of contrast.  8-Type 2 diabetes mellitus with hyperlipidemia (HCC) Use Novolog/Humalog  Sliding scale insulin with Accu-Cheks/Fingersticks as ordered  9-Dementia ([place]) -Continue constant reorientation supportive care. -No behavioral disturbances at this time  10)Gastroesophageal reflux disease/GI prophylaxis -Continue PPI.  11)Chronic Anemia-- Hgb is 9.8 which is close to baseline, no bleeding concerns  12) social/ethics--- palliative care consult appreciated, family request full scope of treatment, except for intubation, ACLS drugs and CPR okay  13)FEN--n.p.o. for now due to altered mentation in the setting of recurrent seizures and postictal state, IV fluids pending better tolerance of oral intake  DVT prophylaxis: Lovenox Code Status: Partial code Family Communication:  Left Voicemail Daughter [name]   [phone] on 04/18/21 [name] spoke with her earlier)  Disposition:   Status is: Inpatient  Remains inpatient appropriate because: Sepsis and new onset seizures requiring IV antibiotics and IV Keppra  Dispo: The patient is from: [facility]              Anticipated d/c is to: Discharge back to facility              Patient currently is not medically stable to d/c.   Difficult to place patient No     Consultants:  Cardiology service Palliative care service Neurology  Procedures:  See below for x-ray report 2D echo:  Antimicrobials: Ceftriaxone   Subjective: -On telemetry monitor occasional episodes of arrhythmias Versus artifact--- patient continues to have jerky seizure-like/tic-like movements which causes lots of artifact on her telemetry monitor -Serial EKGs done suggest sinus tach versus atrial flutter/fib -Recheck serum mag and electrolytes  Objective: Vitals:   04/18/21 1130 04/18/21 1200 04/18/21 1230 04/18/21 1300  BP: (!) 139/43 (!) 136/44 (!) 136/50 131/90  Pulse: 83 81 79 77  Resp: '18 19 20 ' (!) 30  Temp:  98 F (36.7 C)    TempSrc:  Oral    SpO2: 98% 100% 97% 96%  Weight:      Height:        Intake/Output  Summary (Last 24 hours) at 04/18/2021 1425 Last data filed at 04/18/2021 0818 Gross per 24 hour  Intake 606.16 ml  Output 600 ml  Net 6.16 ml   Filed Weights   04/14/21 1306 04/14/21 1816  Weight: 74 kg 61.6 kg   Examination:  Physical Exam Gen:-Lethargic with frequent jerky tic-like movements HEENT:- [place].AT, No sclera icterus Neck-Supple Neck,No JVD,.  Lungs-  mostly clear, fair air movement CV- S1, S2 normal, irregular Abd-  +ve B.Sounds, Abd Soft, No tenderness,    Extremity/Skin:- No  edema,   +pulses  Psych-baseline/underlying cognitive and memory deficits,  neuro-Lt UE weakness with contracture, no additional new focal deficits, Lethargic with frequent jerky tic-like movements   Data Reviewed: I have personally reviewed following labs and imaging studies  CBC: Recent Labs  Lab 04/14/21 1339 04/15/21 0438 04/16/21 0431  WBC 8.4 8.3 8.3  NEUTROABS 6.9  --   --   HGB 10.5* 9.7* 9.8*  HCT 32.8* 30.4* 31.3*  MCV 88.9 89.1 90.5  PLT 295 251 626    Basic Metabolic Panel: Recent Labs  Lab 04/14/21 1339 04/14/21 1914 04/15/21 0438 04/16/21 0431 04/16/21 2019 04/17/21 2333  NA 138  --  136 135 135  --   K 4.3  --  3.2* 3.3* 4.3  --   CL 101  --  104 104 106  --   CO2 27  --  '25 24 24  ' --   GLUCOSE 214*  --  106* 111* 118*  --   BUN 15  --  '13 12 12  ' --   CREATININE 1.10*  --  0.78 0.77 0.77  --   CALCIUM 8.3*  --  7.8* 8.0* 7.9*  --   MG  --  1.7  --  1.5* 1.8 1.7    GFR: Estimated Creatinine Clearance: 48.1 mL/min (by C-G formula based on SCr of 0.77 mg/dL).  Liver Function Tests: Recent Labs  Lab 04/14/21 1339 04/16/21 2019  AST 19 13*  ALT 11 9  ALKPHOS 66 52  BILITOT 0.6 0.4  PROT 5.7* 5.0*  ALBUMIN 2.7* 2.3*    CBG: Recent Labs  Lab 04/18/21 0001 04/18/21 0149 04/18/21 0609 04/18/21 0814 04/18/21 1148  GLUCAP 90 118* 130* 134* 118*     Recent Results (from the past 240 hour(s))  Urine culture     Status: Abnormal (Preliminary  result)   Collection Time: 04/14/21  1:35 PM   Specimen: Urine, Catheterized  Result Value Ref Range Status   Specimen Description   Final    URINE, CATHETERIZED Performed at [facility], [address], [place], [place] [address]    Special Requests   Final    NONE Performed at [facility], [address] [place], [place] [address]    Culture (A)  Final    >=100,000 COLONIES/mL PROTEUS MIRABILIS SUSCEPTIBILITIES TO FOLLOW Performed at [facility], [place] [address], [place], [place] [address]    Report Status PENDING  Incomplete  Blood Culture (routine x 2)     Status: None (Preliminary result)   Collection Time: 04/14/21  1:39 PM   Specimen: BLOOD LEFT HAND  Result Value Ref Range Status   Specimen Description   Final    BLOOD LEFT HAND BOTTLES DRAWN AEROBIC AND ANAEROBIC   Special Requests Blood Culture adequate volume  Final   Culture   Final    NO GROWTH 2 DAYS Performed at [facility], [address] [place], [place] [address]    Report Status PENDING  Incomplete  Blood Culture (routine x 2)     Status: Abnormal   Collection Time: 04/14/21  1:39 PM   Specimen: BLOOD RIGHT FOREARM  Result Value Ref Range Status   Specimen Description   Final    BLOOD RIGHT FOREARM BOTTLES DRAWN AEROBIC AND ANAEROBIC Performed at [facility], [address] [place], [place] [address]    Special Requests   Final    Blood Culture adequate volume Performed at [facility], [address], [place], [place] [address]    Culture  Setup Time   Final    GRAM POSITIVE RODS IN BOTH AEROBIC AND ANAEROBIC BOTTLES Gram Stain Report Called to,Read Back By and Verified With: HYLTON,L'@[email or web address]'  BY MATTHEWS, B 6.28.22 [facility] RESULT PREV. CALLED    Culture (A)  Final    PROPIONIBACTERIUM ACNES Standardized susceptibility testing for this organism is not available. Performed at [facility], [place] [address], [place] [address]    Report Status 04/18/2021 FINAL  Final   Resp Panel by RT-PCR (Flu A&B, Covid) Nasopharyngeal Swab     Status: None   Collection Time: 04/14/21  1:54 PM   Specimen: Nasopharyngeal Swab; Nasopharyngeal(NP) swabs in vial transport medium  Result Value Ref Range Status   SARS Coronavirus 2 by RT PCR NEGATIVE NEGATIVE Final    Comment: (NOTE) SARS-CoV-2 target nucleic acids  are NOT DETECTED.  The SARS-CoV-2 RNA is generally detectable in upper respiratory specimens during the acute phase of infection. The lowest concentration of SARS-CoV-2 viral copies this assay can detect is 138 copies/mL. A negative result does not preclude SARS-Cov-2 infection and should not be used as the sole basis for treatment or other patient management decisions. A negative result may occur with  improper specimen collection/handling, submission of specimen other than nasopharyngeal swab, presence of viral mutation(s) within the areas targeted by this assay, and inadequate number of viral copies(<138 copies/mL). A negative result must be combined with clinical observations, patient history, and epidemiological information. The expected result is Negative.  Fact Sheet for Patients:  EntrepreneurPulse.com.au  Fact Sheet for Healthcare Providers:  IncredibleEmployment.be  This test is no t yet approved or cleared by the [place] FDA and  has been authorized for detection and/or diagnosis of SARS-CoV-2 by FDA under an Emergency Use Authorization (EUA). This EUA will remain  in effect (meaning this test can be used) for the duration of the COVID-19 declaration under Section 564(b)(1) of the Act, 21 U.S.C.section 360bbb-3(b)(1), unless the authorization is terminated  or revoked sooner.       Influenza A by PCR NEGATIVE NEGATIVE Final   Influenza B by PCR NEGATIVE NEGATIVE Final    Comment: (NOTE) The Xpert Xpress SARS-CoV-2/FLU/RSV plus assay is intended as an aid in the diagnosis of influenza from  Nasopharyngeal swab specimens and should not be used as a sole basis for treatment. Nasal washings and aspirates are unacceptable for Xpert Xpress SARS-CoV-2/FLU/RSV testing.  Fact Sheet for Patients: EntrepreneurPulse.com.au  Fact Sheet for Healthcare Providers: IncredibleEmployment.be  This test is not yet approved or cleared by the [place] FDA and has been authorized for detection and/or diagnosis of SARS-CoV-2 by FDA under an Emergency Use Authorization (EUA). This EUA will remain in effect (meaning this test can be used) for the duration of the COVID-19 declaration under Section 564(b)(1) of the Act, 21 U.S.C. section 360bbb-3(b)(1), unless the authorization is terminated or revoked.  Performed at [facility], [address] [place], [place] [address]   MRSA Next Gen by PCR, Nasal     Status: None   Collection Time: 04/14/21  6:15 PM   Specimen: Nasal Mucosa; Nasal Swab  Result Value Ref Range Status   MRSA by PCR Next Gen NOT DETECTED NOT DETECTED Final    Comment: (NOTE) The GeneXpert MRSA Assay (FDA approved for NASAL specimens only), is one component of a comprehensive MRSA colonization surveillance program. It is not intended to diagnose MRSA infection nor to guide or monitor treatment for MRSA infections. Test performance is not FDA approved in patients less than 60 years old. Performed at [facility], [address] [place], [place] [address]      Radiology Studies: CT HEAD WO CONTRAST  Result Date: 04/17/2021 CLINICAL DATA:  Seizure EXAM: CT HEAD WITHOUT CONTRAST TECHNIQUE: Contiguous axial images were obtained from the base of the skull through the vertex without intravenous contrast. COMPARISON:  None. 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 Old left posterior parietal infarct. No acute hemorrhage. There is cortical laminar necrosis at the posterior right parietal lobe, unchanged. There are multiple  old small vessel infarcts of the cerebellum. Vascular: No abnormal hyperdensity of the major intracranial arteries or dural venous sinuses. No intracranial atherosclerosis. Skull: The visualized skull base, calvarium and extracranial soft tissues are normal. Sinuses/Orbits: No fluid levels or advanced mucosal thickening of the visualized paranasal  sinuses. No mastoid or middle ear effusion. The orbits are normal. IMPRESSION: 1. No acute intracranial abnormality. 2. Old right MCA and PCA territory infarcts and multiple old small vessel infarcts of the cerebellum. Electronically Signed   By: Ulyses Jarred M.D.   On: 04/17/2021 01:06   EEG adult  Result Date: 04/17/2021 Lora Havens, MD     04/17/2021  5:23 PM Patient Name: Rebecca Hardy MRN: [id number] Epilepsy Attending: Lora Havens Referring Physician/Provider: Dr Roxan Hockey Date: 04/17/2021 Duration: 23.31 mins Patient history: 87yo F with h/o R MCA stroke, noted to have left face twitching. EEG to evaluate for seizure Level of alertness:  lethargic AEDs during EEG study: LEV Technical aspects: This EEG study was done with scalp electrodes positioned according to the 10-20 International system of electrode placement. Electrical activity was acquired at a sampling rate of '500Hz'  and reviewed with a high frequency filter of '70Hz'  and a low frequency filter of '1Hz' . EEG data were recorded continuously and digitally stored. Description: No posterior dominant rhythm was seen. EEG showed continuous generalized 3 to 6 Hz theta-delta slowing. Patient was noted to have frequent episodes of chewing like movements. At times, there was a subtle head turn to right prior to this chewing movements.Concomitant eeg showed significant movement artifact in right hemisphere. Hyperventilation and photic stimulation were not performed.   IMPRESSION: This study  is suggestive of moderate diffuse encephalopathy, nonspecific etiology. Multiple episodes of chewing like  movements which at times was preceded by subtle head turn to right were recorded. Concomitant eeg was difficult to interpret due to significant movement artifact. However, focal motor seizures may not be seen on scalp eeg. Additionally, given h/o underlying stroke, semiology of episodes is concerning for seizures. Clinical correlation is recommended. Dr Denton Brick was notified. Priyanka [name]     Scheduled Meds:  (feeding supplement) PROSource Plus  30 mL Oral TID BM   atorvastatin  20 mg Oral Daily   chlorhexidine  15 mL Mouth Rinse BID   Chlorhexidine Gluconate Cloth  6 each Topical Daily   enoxaparin (LOVENOX) injection  40 mg Subcutaneous Q24H   feeding supplement  237 mL Oral BID BM   insulin aspart  0-5 Units Subcutaneous QHS   insulin aspart  0-9 Units Subcutaneous TID WC   mouth rinse  15 mL Mouth Rinse q12n4p   metoprolol tartrate  50 mg Oral BID   multivitamin with minerals  1 tablet Oral Daily   pantoprazole  40 mg Oral Daily   vitamin B-12  500 mcg Oral Daily   Continuous Infusions:  sodium chloride 250 mL (04/17/21 0336)   cefTRIAXone (ROCEPHIN)  IV 2 g (04/18/21 0211)   dextrose 5 % and 0.9% NaCl 50 mL/hr at 04/17/21 2245   lacosamide (VIMPAT) IV 50 mg (04/18/21 1136)   levETIRAcetam 1,000 mg (04/18/21 0840)    LOS: 4 days   Roxan Hockey, MD [facility]  To contact the attending provider between 7A-7P or the covering provider during after hours 7P-7A, please log into the web site [email or web address] and access using universal [place] password for that web site. If you do not have the password, please call the hospital operator.  04/18/2021, 2:25 PM

## 2021-04-18 NOTE — Progress Notes (Signed)
  Speech Language Pathology Treatment: Dysphagia  Patient Details Name: Rebecca Hardy MRN: [id number] DOB: Dec 15, 1932 Today's Date: 04/18/2021 Time: 7681-1572 SLP Time Calculation (min) (ACUTE ONLY): 19 min  Assessment / Plan / Recommendation Clinical Impression  Pt seen for ongoing dysphagia intervention. She was placed on a puree diet (baseline at ALF) and NTL on Tuesday, however has been somnolent and made NPO. RN reports increased alertness today and has been requesting something to drink. SLP assessed Pt at bedside with ice chips, thin, NTL, and puree. Pt continues to present with left labial weakness. PO trials were met with oral holding at times and suspected delay in swallow initiation. Pt verbalizes "okay" throughout po trials despite cues from SLP to refrain from talking while eating/drinking. She occasionally had difficulty sequencing suck and swallow from straw she also attempted to blow out. Pt with immediate coughing after cup sips of thin, suspect reduced oral control and Pt with tic which impacted this. Improved performance with NTL, however an occasional cough elicited (dry). Ok to resume D1/puree and NTL when Pt is alert. SLP will follow. Above to RN.    HPI HPI: Rebecca Hardy is a 87 y.o. female with medical history significant for hypertension, CVA with left-sided weakness, dementia, diabetes mellitus.   Patient was brought to the ED from facility reports of possible seizure activity, patient's temperature was 103 per nursing staff.  Patient was seizing for about 5 minutes prior to EMS arrival, 2.5 mg of Versed was given patient stopped seizing for about 1 and half  minute minutes, and then repeat dose of 2.5 mg of Versed was given. Pt is a resident at [facility] and reportedly on a pureed diet. BSE requested.      SLP Plan  Continue with current plan of care       Recommendations  Diet recommendations: Dysphagia 1 (puree);Nectar-thick liquid Medication Administration:  Crushed with puree Supervision: Staff to assist with self feeding;Full supervision/cueing for compensatory strategies Compensations: Slow rate;Small sips/bites Postural Changes and/or Swallow Maneuvers: Upright 30-60 min after meal;Seated upright 90 degrees                Oral Care Recommendations: Oral care BID Follow up Recommendations: Skilled Nursing facility SLP Visit Diagnosis: Dysphagia, oropharyngeal phase (R13.12) Plan: Continue with current plan of care       Thank you,  Genene Churn, [place]                 [place] 04/18/2021, 5:05 PM

## 2021-04-18 NOTE — Plan of Care (Signed)

## 2021-04-19 DIAGNOSIS — A498 Other bacterial infections of unspecified site: Secondary | ICD-10-CM

## 2021-04-19 LAB — CULTURE, BLOOD (ROUTINE X 2)
Culture: NO GROWTH
Special Requests: ADEQUATE

## 2021-04-19 LAB — GLUCOSE, CAPILLARY
Glucose-Capillary: 130 mg/dL — ABNORMAL HIGH (ref 70–99)
Glucose-Capillary: 164 mg/dL — ABNORMAL HIGH (ref 70–99)
Glucose-Capillary: 177 mg/dL — ABNORMAL HIGH (ref 70–99)
Glucose-Capillary: 189 mg/dL — ABNORMAL HIGH (ref 70–99)
Glucose-Capillary: 194 mg/dL — ABNORMAL HIGH (ref 70–99)

## 2021-04-19 LAB — CBC
HCT: 31.1 % — ABNORMAL LOW (ref 36.0–46.0)
Hemoglobin: 10 g/dL — ABNORMAL LOW (ref 12.0–15.0)
MCH: 28.5 pg (ref 26.0–34.0)
MCHC: 32.2 g/dL (ref 30.0–36.0)
MCV: 88.6 fL (ref 80.0–100.0)
Platelets: 270 10*3/uL (ref 150–400)
RBC: 3.51 MIL/uL — ABNORMAL LOW (ref 3.87–5.11)
RDW: 14.6 % (ref 11.5–15.5)
WBC: 7.9 10*3/uL (ref 4.0–10.5)
nRBC: 0 % (ref 0.0–0.2)

## 2021-04-19 LAB — BASIC METABOLIC PANEL
Anion gap: 7 (ref 5–15)
BUN: 5 mg/dL — ABNORMAL LOW (ref 8–23)
CO2: 22 mmol/L (ref 22–32)
Calcium: 8 mg/dL — ABNORMAL LOW (ref 8.9–10.3)
Chloride: 106 mmol/L (ref 98–111)
Creatinine, Ser: 0.66 mg/dL (ref 0.44–1.00)
GFR, Estimated: >60 mL/min (ref >60)
Glucose, Bld: 186 mg/dL — ABNORMAL HIGH (ref 70–99)
Potassium: 4 mmol/L (ref 3.5–5.1)
Sodium: 135 mmol/L (ref 135–145)

## 2021-04-19 MED ORDER — METOPROLOL TARTRATE 5 MG/5ML IV SOLN
2.5000 mg | Freq: Once | INTRAVENOUS | Status: AC
  Administered 2021-04-19: 2.5 mg via INTRAVENOUS
  Filled 2021-04-19: qty 5

## 2021-04-19 MED ORDER — LABETALOL HCL 5 MG/ML IV SOLN
10.0000 mg | INTRAVENOUS | Status: DC | PRN
  Administered 2021-04-19 – 2021-04-24 (×2): 10 mg via INTRAVENOUS
  Filled 2021-04-19 (×2): qty 4

## 2021-04-19 MED ORDER — PANTOPRAZOLE SODIUM 40 MG PO PACK
40.0000 mg | PACK | Freq: Every day | ORAL | Status: DC
  Administered 2021-04-20 – 2021-04-22 (×3): 40 mg
  Filled 2021-04-19 (×3): qty 20

## 2021-04-19 NOTE — Progress Notes (Signed)
Palliative: Rebecca Hardy is lying quietly in bed.  She will open her eyes, but not make eye contact when I touch her.  She is unable to tell me her name.  She appears acutely/chronically ill and very frail.  I tell her that I have spoken with her daughter who sends her love.  She said she loves her daughter.  Most of the time she just repeats "okay, okay".  She is unable to make her basic needs known.  There is no family at bedside at this time.  Chat from nursing staff who shares that family is at bedside.  I arrived at bedside for family conference with daughter Rebecca Hardy.  We talk in detail about Rebecca Hardy's acute and chronic health concerns.  We talked about Proteus UTI and the treatment plan.  We talked about seizure, EEG, medical therapies.  We talked about heart rate and blood pressure.  I share my concern about her ability to recover at this point.    Rebecca Hardy asks what is next.  I share that if Rebecca Hardy improves enough, she will be evaluated by physical therapy.  Rebecca Hardy shares that Rebecca Hardy is "almost bedbound" at her current facility.  We talked about her ability and desire to rehab.  I share that there is a possibility that Rebecca Hardy will not get well enough to leave the hospital.  We talked about CODE STATUS.  Rebecca Hardy shares that the family had decided "no machines".  She shares that after seeing her mother and hearing more about her illnesses she would like to have further goals of care/CODE STATUS discussions with her family.  We talked about the concept of "treat the treatable, but allowing natural death".  I ask if Rebecca Hardy thinks that Rebecca Hardy has "done the work she came here to do?".  We talked about comfort and dignity at end-of-life and the concept of "let nature take its course".  I give an example of when this is appropriate.  Conference with attending, bedside nursing staff, transition of care team related to patient condition, needs, goals of care. PMT to follow-up 7/5.  Plan:   At this  point continue to treat the treatable but no intubation.  Agreeable to chest compressions, defibrillation, medicines.  Daughter Rebecca Hardy to continue CODE STATUS discussions with family.  Time for outcomes.  27 minutes  [name], NP Palliative medicine team Team phone [phone] Greater than 50% of this time was spent counseling and coordinating care related to the above assessment and plan.

## 2021-04-19 NOTE — Progress Notes (Signed)
  Speech Language Pathology Treatment: Dysphagia  Patient Details Name: Rebecca Hardy MRN: [id number] DOB: 07-27-1933 Today's Date: 04/19/2021 Time: 2094-7096 SLP Time Calculation (min) (ACUTE ONLY): 15 min  Assessment / Plan / Recommendation Clinical Impression  Pt was provided ongoing diagnostic dysphagia therapy. RN present at bedside and SLP and RN repositioned Pt to appropriate positioning for PO trials; she was alert and responsive. Pt provided trials of magic cup (pudding texture) and NTL via cup. Pt continued verbalizing "ok" and "yeah" throughout trials again despite SLP cues to "stop talking and swallow" after presenting bites. Pt required max/total cues and reminders to swallow and note one dry delayed cough after trials. Recommend continue with D1/puree and NTL and meds crushed in puree with precautions re: only feed when alert, verbal cues to "swallow" and check to ensure one presentation is gone before presenting another bite/sip. SLP will continue to follow acutely,     HPI HPI: Rebecca Hardy is a 87 y.o. female with medical history significant for hypertension, CVA with left-sided weakness, dementia, diabetes mellitus.   Patient was brought to the ED from facility reports of possible seizure activity, patient's temperature was 103 per nursing staff.  Patient was seizing for about 5 minutes prior to EMS arrival, 2.5 mg of Versed was given patient stopped seizing for about 1 and half  minute minutes, and then repeat dose of 2.5 mg of Versed was given. Pt is a resident at [facility] and reportedly on a pureed diet. BSE requested.      SLP Plan  Continue with current plan of care       Recommendations  Diet recommendations: Dysphagia 1 (puree);Nectar-thick liquid Liquids provided via: Cup;Straw Medication Administration: Crushed with puree Supervision: Staff to assist with self feeding;Full supervision/cueing for compensatory strategies Compensations: Slow rate;Small  sips/bites Postural Changes and/or Swallow Maneuvers: Upright 30-60 min after meal;Seated upright 90 degrees                Oral Care Recommendations: Oral care BID Follow up Recommendations: Skilled Nursing facility SLP Visit Diagnosis: Dysphagia, oropharyngeal phase (R13.12) Plan: Continue with current plan of care       Marquette Blodgett H. Roddie Mc, CCC-SLP Speech Language Pathologist     Wende Bushy 04/19/2021, 11:16 AM

## 2021-04-19 NOTE — Progress Notes (Signed)
PROGRESS NOTE    Rebecca Hardy  XTG:[id number] DOB: 1933-10-12 DOA: 04/14/2021 PCP: Bonnita Nasuti, MD    Chief Complaint  Patient presents with   Seizures    Brief admission Narrative:  Rebecca Hardy is a 87 y.o. female with medical history significant for hypertension, CVA with left-sided weakness, dementia, diabetes mellitus.  Patient was brought to the ED from facility reports of possible seizure activity, patient's temperature was 103 per nursing staff.  Patient was seizing for about 5 minutes prior to EMS arrival, 2.5 mg of Versed was given patient stopped seizing for about 1 and half  minute minutes, and then repeat dose of 2.5 mg of Versed was given. EDP talked to nursing home staff, reported at about 10:30 AM patient was okay, and about noon they went to feed her and patient was shaking but still able to talk, and was oriented x1 this is her baseline.  Does reported that this has happened in the past, and was due to hypoglycemia.  Her blood sugars have been elevated.  Also reports wounds to her feet which were x-rayed and negative for osteomyelitis. On my evaluation patient is awake, alert, verbalizing able to tell me her name but not answer subsequent questions.   ED Course: Febrile to 101.7.  Tachycardic 156, respiratory rate 12-22.  Blood pressure systolic 27-035.  O2 sats greater than 98% on room air.  Lactic acid 3 > 2.  WBC 8.4.  UA with large leukocytes.  Head CT without acute abnormality.  Port chest x-ray unremarkable.  500 mils normal saline bolus given in route to ED, 1500 mils sepsis fluid bolus given in ED.  EDP talked to cardiologist on-call- Dr. Doree Albee, he also initial concern that this was atrial flutter.  And recommendation was to give IV metoprolol and if not effective start IV amiodarone.  Assessment & Plan:  1)Severe Sepsis with septic shock due to Proteus Mirabilis UTI-- -Patient met severe sepsis criteria on exam secondary to UTI; high-grade temperature,  elevated heart rate, elevated lactic acid and acute kidney injury. -Urine Cx from 04/14/21  with Proteus Mirabilis --sensitivities pending Blood Cx from 04/14/21 with PROPIONIBACTERIUM ACNES--suspect is a contaminant -COVID test negative -Treated with IV Rocephin 2 gm daily for Proteus mirabilis, de-escalated to Ancef -Persistent hypotension due to septic shock required IV Levophed overnight, patient has now been weaned off Levophed with improved BP  2)New Onset Seizures----patient with history of prior strokes -EEG from 04/17/2021 noted -Continue Keppra and Vimpat--both were started on this admission -May use lorazepam as needed -Official neuro consult appreciated  3)-Atrial flutter with RVR -weaned off amiodarone iv drip  C/n metoprolol 50 mg bid -Cardiology consult appreciated -2D echo with EF of 55 to 60 % -No anticoagulation in the setting of advanced dementia and high risk for falls. -CHADSVASC score >5 -Continue Aspirin  4)History of stroke/TIA with Left-sided weakness -CT head shows old  right MCA and right PCA infarcts. -Continue aspirin and and lipitor -No additional new  Acutefocal deficits appreciated.  5)-hypokalemia/Hypomagnesemia--- replace and recheck, please see #3 above  6)-Essential hypertension -Stable overall -Continue adjusted dose of metoprolol.  7)-acute kidney injury on CKD (chronic kidney disease) stage 3a, GFR 30-59 ml/min (HCC) -In the setting of dehydration and UTI -Back to baseline after fluid resuscitation -Continue treatment with antibiotic -Follow renal function trend -Minimize using nephrotoxic agents, hypotension and the use of contrast.  8-Type 2 diabetes mellitus with hyperlipidemia (HCC) Use Novolog/Humalog Sliding scale insulin with Accu-Cheks/Fingersticks as ordered  9-Dementia ([place]) -Continue constant reorientation supportive care. -No behavioral disturbances at this time  10)Gastroesophageal reflux disease/GI prophylaxis -Continue  PPI.  11)Chronic Anemia-- Hgb is 9.8 which is close to baseline, no bleeding concerns  12) social/ethics--- palliative care consult appreciated, family request full scope of treatment, except for intubation, ACLS drugs and CPR okay  13)FEN/dysphagia--poor oral intake partially due to altered mentation in the setting of recurrent seizures and postictal state, IV fluids pending better tolerance of oral intake -Speech pathologist eval appreciated--recommends Dysphagia 1 (puree);Nectar-thick liquid  DVT prophylaxis: Lovenox Code Status: Partial code Family Communication: Daughter Jocelyn Lamer visited  palliative care provider spoke with daughter Paul Half at [phone] on 04/18/21 [name] spoke with her )  Disposition:   Status is: Inpatient  Remains inpatient appropriate because: Sepsis and new onset seizures requiring IV antibiotics and IV Keppra  Dispo: The patient is from: [facility]              Anticipated d/c is to: Discharge back to facility              Patient currently is not medically stable to d/c.   Difficult to place patient No     Consultants:  Cardiology service Palliative care service Neurology  Procedures:  See below for x-ray report 2D echo:  Antimicrobials: Ceftriaxone   Subjective: --Occasional episodes of jerky, shaking movements with artifacts on the monitor -Oral intake remains challenging -Sleepy but wakes up to answer simple questions  Objective: Vitals:   04/19/21 1500 04/19/21 1600 04/19/21 1624 04/19/21 1700  BP: (!) 113/41 102/75    Pulse: 70 65  69  Resp: 19 12  (!) 22  Temp:   98 F (36.7 C)   TempSrc:   Oral   SpO2: 98% 98%  97%  Weight:      Height:        Intake/Output Summary (Last 24 hours) at 04/19/2021 1756 Last data filed at 04/19/2021 1700 Gross per 24 hour  Intake 1223 ml  Output 650 ml  Net 573 ml   Filed Weights   04/14/21 1306 04/14/21 1816  Weight: 74 kg 61.6 kg   Examination:  Physical  Exam Gen:-Sleepy, but she wakes up to answer simple questions  HEENT:- [place].AT, No sclera icterus Neck-Supple Neck,No JVD,.  Lungs-  mostly clear, fair air movement CV- S1, S2 normal, irregular Abd-  +ve B.Sounds, Abd Soft, No tenderness,    Extremity/Skin:- No  edema,   +pulses  Psych-baseline/underlying cognitive and memory deficits,  neuro-Lt UE weakness with contracture, no additional new focal deficits,    Data Reviewed: I have personally reviewed following labs and imaging studies  CBC: Recent Labs  Lab 04/14/21 1339 04/15/21 0438 04/16/21 0431 04/19/21 0429  WBC 8.4 8.3 8.3 7.9  NEUTROABS 6.9  --   --   --   HGB 10.5* 9.7* 9.8* 10.0*  HCT 32.8* 30.4* 31.3* 31.1*  MCV 88.9 89.1 90.5 88.6  PLT 295 251 279 710    Basic Metabolic Panel: Recent Labs  Lab 04/14/21 1914 04/15/21 0438 04/16/21 0431 04/16/21 2019 04/17/21 2333 04/18/21 1518 04/19/21 0429  NA  --  136 135 135  --  139 135  K  --  3.2* 3.3* 4.3  --  3.3* 4.0  CL  --  104 104 106  --  107 106  CO2  --  _0 --  23 22  GLUCOSE  --  106* 111* 118*  --  123*  186*  BUN  --  _0 --  7* 5*  CREATININE  --  0.78 0.77 0.77  --  0.64 0.66  CALCIUM  --  7.8* 8.0* 7.9*  --  8.0* 8.0*  MG 1.7  --  1.5* 1.8 1.7 1.9  --   PHOS  --   --   --   --   --  2.2*  --     GFR: Estimated Creatinine Clearance: 48.1 mL/min (by C-G formula based on SCr of 0.66 mg/dL).  Liver Function Tests: Recent Labs  Lab 04/14/21 1339 04/16/21 2019 04/18/21 1518  AST 19 13*  --   ALT 11 9  --   ALKPHOS 66 52  --   BILITOT 0.6 0.4  --   PROT 5.7* 5.0*  --   ALBUMIN 2.7* 2.3* 2.6*    CBG: Recent Labs  Lab 04/18/21 2244 04/19/21 0019 04/19/21 0634 04/19/21 1138 04/19/21 1611  GLUCAP 125* 130* 189* 164* 177*     Recent Results (from the past 240 hour(s))  Urine culture     Status: Abnormal   Collection Time: 04/14/21  1:35 PM   Specimen: Urine, Catheterized  Result Value Ref Range Status   Specimen  Description   Final    URINE, CATHETERIZED Performed at [facility], [address], [place], [place] [address]    Special Requests   Final    NONE Performed at [facility], [address], [place], [place] [address]    Culture >=100,000 COLONIES/mL PROTEUS MIRABILIS (A)  Final   Report Status 04/18/2021 FINAL  Final   Organism ID, Bacteria PROTEUS MIRABILIS (A)  Final      Susceptibility   Proteus mirabilis - MIC*    AMPICILLIN >=32 RESISTANT Resistant     CEFAZOLIN <=4 SENSITIVE Sensitive     CEFEPIME <=0.12 SENSITIVE Sensitive     CEFTRIAXONE <=0.25 SENSITIVE Sensitive     CIPROFLOXACIN >=4 RESISTANT Resistant     GENTAMICIN <=1 SENSITIVE Sensitive     IMIPENEM 1 SENSITIVE Sensitive     NITROFURANTOIN 128 RESISTANT Resistant     TRIMETH/SULFA <=20 SENSITIVE Sensitive     AMPICILLIN/SULBACTAM 4 SENSITIVE Sensitive     PIP/TAZO <=4 SENSITIVE Sensitive     * >=100,000 COLONIES/mL PROTEUS MIRABILIS  Blood Culture (routine x 2)     Status: None   Collection Time: 04/14/21  1:39 PM   Specimen: BLOOD LEFT HAND  Result Value Ref Range Status   Specimen Description   Final    BLOOD LEFT HAND BOTTLES DRAWN AEROBIC AND ANAEROBIC   Special Requests Blood Culture adequate volume  Final   Culture   Final    NO GROWTH 5 DAYS Performed at [facility], [address], [place], [place] [address]    Report Status 04/19/2021 FINAL  Final  Blood Culture (routine x 2)     Status: Abnormal   Collection Time: 04/14/21  1:39 PM   Specimen: BLOOD RIGHT FOREARM  Result Value Ref Range Status   Specimen Description   Final    BLOOD RIGHT FOREARM BOTTLES DRAWN AEROBIC AND ANAEROBIC Performed at [facility], [address], [place], [place] [address]    Special Requests   Final    Blood Culture adequate volume Performed at [facility], [address], [place], [place] [address]    Culture  Setup Time   Final    GRAM POSITIVE RODS IN BOTH AEROBIC AND ANAEROBIC BOTTLES Gram Stain Report Called  to,Read Back By and Verified With: HYLTON,L_0  BY MATTHEWS, B 6.28.22 [facility] RESULT PREV. CALLED    Culture (A)  Final    PROPIONIBACTERIUM ACNES Standardized susceptibility testing for this organism is not available. Performed at [facility], [place] [address] [place], [place] [address]    Report Status 04/18/2021 FINAL  Final  Resp Panel by RT-PCR (Flu A&B, Covid) Nasopharyngeal Swab     Status: None   Collection Time: 04/14/21  1:54 PM   Specimen: Nasopharyngeal Swab; Nasopharyngeal(NP) swabs in vial transport medium  Result Value Ref Range Status   SARS Coronavirus 2 by RT PCR NEGATIVE NEGATIVE Final    Comment: (NOTE) SARS-CoV-2 target nucleic acids are NOT DETECTED.  The SARS-CoV-2 RNA is generally detectable in upper respiratory specimens during the acute phase of infection. The lowest concentration of SARS-CoV-2 viral copies this assay can detect is 138 copies/mL. A negative result does not preclude SARS-Cov-2 infection and should not be used as the sole basis for treatment or other patient management decisions. A negative result may occur with  improper specimen collection/handling, submission of specimen other than nasopharyngeal swab, presence of viral mutation(s) within the areas targeted by this assay, and inadequate number of viral copies(<138 copies/mL). A negative result must be combined with clinical observations, patient history, and epidemiological information. The expected result is Negative.  Fact Sheet for Patients:  EntrepreneurPulse.com.au  Fact Sheet for Healthcare Providers:  IncredibleEmployment.be  This test is no t yet approved or cleared by the [place] FDA and  has been authorized for detection and/or diagnosis of SARS-CoV-2 by FDA under an Emergency Use Authorization (EUA). This EUA will remain  in effect (meaning this test can be used) for the duration of the COVID-19 declaration under  Section 564(b)(1) of the Act, 21 U.S.C.section 360bbb-3(b)(1), unless the authorization is terminated  or revoked sooner.       Influenza A by PCR NEGATIVE NEGATIVE Final   Influenza B by PCR NEGATIVE NEGATIVE Final    Comment: (NOTE) The Xpert Xpress SARS-CoV-2/FLU/RSV plus assay is intended as an aid in the diagnosis of influenza from Nasopharyngeal swab specimens and should not be used as a sole basis for treatment. Nasal washings and aspirates are unacceptable for Xpert Xpress SARS-CoV-2/FLU/RSV testing.  Fact Sheet for Patients: EntrepreneurPulse.com.au  Fact Sheet for Healthcare Providers: IncredibleEmployment.be  This test is not yet approved or cleared by the [place] FDA and has been authorized for detection and/or diagnosis of SARS-CoV-2 by FDA under an Emergency Use Authorization (EUA). This EUA will remain in effect (meaning this test can be used) for the duration of the COVID-19 declaration under Section 564(b)(1) of the Act, 21 U.S.C. section 360bbb-3(b)(1), unless the authorization is terminated or revoked.  Performed at [facility], [address] [place], [place] [address]   MRSA Next Gen by PCR, Nasal     Status: None   Collection Time: 04/14/21  6:15 PM   Specimen: Nasal Mucosa; Nasal Swab  Result Value Ref Range Status   MRSA by PCR Next Gen NOT DETECTED NOT DETECTED Final    Comment: (NOTE) The GeneXpert MRSA Assay (FDA approved for NASAL specimens only), is one component of a comprehensive MRSA colonization surveillance program. It is not intended to diagnose MRSA infection nor to guide or monitor treatment for MRSA infections. Test performance is not FDA approved in patients less than 92 years old. Performed at [facility], [address], [place], [place] [address]  Radiology Studies: No results found.   Scheduled Meds:  (feeding supplement) PROSource Plus  30 mL Oral TID BM   atorvastatin  20 mg  Oral Daily   chlorhexidine  15 mL Mouth Rinse BID   Chlorhexidine Gluconate Cloth  6 each Topical Daily   enoxaparin (LOVENOX) injection  40 mg Subcutaneous Q24H   feeding supplement  237 mL Oral BID BM   insulin aspart  0-5 Units Subcutaneous QHS   insulin aspart  0-9 Units Subcutaneous TID WC   mouth rinse  15 mL Mouth Rinse q12n4p   metoprolol tartrate  50 mg Oral BID   multivitamin with minerals  1 tablet Oral Daily   pantoprazole sodium  40 mg Per Tube Daily   vitamin B-12  500 mcg Oral Daily   Continuous Infusions:  sodium chloride 250 mL (04/17/21 0336)    ceFAZolin (ANCEF) IV 1 g (04/19/21 1534)   lacosamide (VIMPAT) IV 50 mg (04/19/21 1200)   levETIRAcetam 1,000 mg (04/19/21 1040)    LOS: 5 days   Roxan Hockey, MD [facility]  To contact the attending provider between 7A-7P or the covering provider during after hours 7P-7A, please log into the web site [email or web address] and access using universal [place] password for that web site. If you do not have the password, please call the hospital operator.  04/19/2021, 5:56 PM

## 2021-04-19 NOTE — Progress Notes (Signed)
The patient's A-Fib has developed into RVR in the 140's bpm.  The patient's BP remains stable and the doctor covering has been notified.

## 2021-04-20 LAB — GLUCOSE, CAPILLARY
Glucose-Capillary: 146 mg/dL — ABNORMAL HIGH (ref 70–99)
Glucose-Capillary: 162 mg/dL — ABNORMAL HIGH (ref 70–99)
Glucose-Capillary: 182 mg/dL — ABNORMAL HIGH (ref 70–99)
Glucose-Capillary: 186 mg/dL — ABNORMAL HIGH (ref 70–99)

## 2021-04-20 MED ORDER — ENOXAPARIN SODIUM 40 MG/0.4ML IJ SOSY
40.0000 mg | PREFILLED_SYRINGE | INTRAMUSCULAR | Status: DC
  Administered 2021-04-20 – 2021-04-23 (×4): 40 mg via SUBCUTANEOUS
  Filled 2021-04-20 (×4): qty 0.4

## 2021-04-20 MED ORDER — LACOSAMIDE 200 MG/20ML IV SOLN
INTRAVENOUS | Status: AC
  Filled 2021-04-20: qty 20

## 2021-04-20 MED ORDER — PROSOURCE PLUS PO LIQD
30.0000 mL | Freq: Three times a day (TID) | ORAL | Status: DC
  Administered 2021-04-20 – 2021-04-24 (×8): 30 mL via ORAL
  Filled 2021-04-20 (×8): qty 30

## 2021-04-20 NOTE — Plan of Care (Signed)

## 2021-04-20 NOTE — Plan of Care (Signed)

## 2021-04-20 NOTE — Progress Notes (Signed)
PROGRESS NOTE    Rebecca Hardy  YIF:[id number] DOB: 10/12/1933 DOA: 04/14/2021 PCP: Bonnita Nasuti, MD    Chief Complaint  Patient presents with   Seizures    Brief admission Narrative:  Rebecca Hardy is a 87 y.o. female with medical history significant for hypertension, CVA with left-sided weakness, dementia, diabetes mellitus.  Patient was brought to the ED from facility reports of possible seizure activity, patient's temperature was 103 per nursing staff.  Patient was seizing for about 5 minutes prior to EMS arrival, 2.5 mg of Versed was given patient stopped seizing for about 1 and half  minute minutes, and then repeat dose of 2.5 mg of Versed was given. EDP talked to nursing home staff, reported at about 10:30 AM patient was okay, and about noon they went to feed her and patient was shaking but still able to talk, and was oriented x1 this is her baseline.  Does reported that this has happened in the past, and was due to hypoglycemia.  Her blood sugars have been elevated.  Also reports wounds to her feet which were x-rayed and negative for osteomyelitis. On my evaluation patient is awake, alert, verbalizing able to tell me her name but not answer subsequent questions.   ED Course: Febrile to 101.7.  Tachycardic 156, respiratory rate 12-22.  Blood pressure systolic 86-767.  O2 sats greater than 98% on room air.  Lactic acid 3 > 2.  WBC 8.4.  UA with large leukocytes.  Head CT without acute abnormality.  Port chest x-ray unremarkable.  500 mils normal saline bolus given in route to ED, 1500 mils sepsis fluid bolus given in ED.  EDP talked to cardiologist on-call- Dr. Doree Albee, he also initial concern that this was atrial flutter.  And recommendation was to give IV metoprolol and if not effective start IV amiodarone.  Assessment & Plan:  1)Severe Sepsis with septic shock due to Proteus Mirabilis UTI-- -Patient met severe sepsis criteria on exam secondary to UTI; high-grade temperature,  elevated heart rate, elevated lactic acid and acute kidney injury. -Urine Cx from 04/14/21  with Proteus Mirabilis --sensitivities pending Blood Cx from 04/14/21 with PROPIONIBACTERIUM ACNES--suspect is a contaminant -COVID test negative -Treated with IV Rocephin 2 gm daily for Proteus mirabilis, de-escalated to Ancef patient has now been weaned off Levophed with improved BP  2)New Onset Seizures----patient with history of prior strokes -EEG from 04/17/2021 noted -Continue Keppra and Vimpat--both were started on this admission -May use lorazepam as needed -Official neuro consult appreciated  3)-Atrial flutter with RVR -weaned off amiodarone iv drip  C/n metoprolol 50 mg bid -Cardiology consult appreciated -2D echo with EF of 55 to 60 % -No anticoagulation in the setting of advanced dementia and high risk for falls. -CHADSVASC score >5 -Continue Aspirin  4)History of stroke/TIA with Left-sided weakness -CT head shows old  right MCA and right PCA infarcts. -Continue aspirin and and lipitor -No additional new  Acutefocal deficits appreciated.  5)-hypokalemia/Hypomagnesemia--- replace and recheck, please see #3 above  6)-Essential hypertension -Stable overall -Continue adjusted dose of metoprolol.  7)-acute kidney injury on CKD (chronic kidney disease) stage 3a, GFR 30-59 ml/min (HCC) -In the setting of dehydration and UTI -Back to baseline after fluid resuscitation -Continue treatment with antibiotic -Follow renal function trend -Minimize using nephrotoxic agents, hypotension and the use of contrast.  8-Type 2 diabetes mellitus with hyperlipidemia (HCC) Use Novolog/Humalog Sliding scale insulin with Accu-Cheks/Fingersticks as ordered  9-Dementia ([place]) -Continue constant reorientation supportive care. -No behavioral  disturbances at this time  10)Gastroesophageal reflux disease/GI prophylaxis -Continue PPI.  11)Chronic Anemia-- Hgb is 9.8 which is close to baseline, no  bleeding concerns  12) social/ethics--- palliative care consult appreciated, family request full scope of treatment, except for intubation, ACLS drugs and CPR okay -Awaiting family decision regarding goals of care to help determine disposition, if family wants full scope of treatment without limitation patient may have to go to SNF rehab, however family wants to de-escalate care and transition to more comfort based measures patient may be able to go back to ALF with palliative or hospice following   13)FEN/dysphagia--poor oral intake partially due to altered mentation in the setting of recurrent seizures and postictal state, IV fluids pending better tolerance of oral intake -Speech pathologist eval appreciated--recommends Dysphagia 1 (puree);Nectar-thick liquid  14)Generalized weakness--PT eval requested  DVT prophylaxis: Lovenox Code Status: Partial code Family Communication: Daughter Jocelyn Lamer visited  palliative care provider spoke with daughter Paul Half at [phone] on 04/18/21 [name] spoke with her )  Disposition:   Status is: Inpatient  Remains inpatient appropriate because: Sepsis and new onset seizures requiring IV antibiotics and IV Keppra  Dispo: The patient is from: [place]              Anticipated d/c is to: Discharge back to facility              Patient currently is not medically stable to d/c.   Difficult to place patient No -Awaiting family decision regarding goals of care to help determine disposition, if family wants full scope of treatment without limitation patient may have to go to SNF rehab, however family wants to de-escalate care and transition to more comfort based measures patient may be able to go back to ALF with palliative or hospice following      Consultants:  Cardiology service Palliative care service Neurology  Procedures:  See below for x-ray report 2D echo:  Antimicrobials: Ceftriaxone   Subjective: Awaiting family  decision regarding goals of care to help determine disposition, if family wants full scope of treatment without limitation patient may have to go to SNF rehab, however family wants to de-escalate care and transition to more comfort based measures patient may be able to go back to ALF with palliative or hospice following    Objective: Vitals:   04/20/21 1200 04/20/21 1300 04/20/21 1400 04/20/21 1600  BP: (!) 157/72 127/69 (!) 142/54   Pulse: 75 67 70 67  Resp: 17 (!) '24 16 16  ' Temp:    99.1 F (37.3 C)  TempSrc:    Axillary  SpO2: 98% 98% 98% 98%  Weight:      Height:        Intake/Output Summary (Last 24 hours) at 04/20/2021 1821 Last data filed at 04/20/2021 1748 Gross per 24 hour  Intake 451.84 ml  Output 600 ml  Net -148.16 ml   Filed Weights   04/14/21 1306 04/14/21 1816 04/20/21 0321  Weight: 74 kg 61.6 kg 68.4 kg   Examination:  Physical Exam Gen:-Sleepy, but she wakes up to answer simple questions  HEENT:- [place].AT, No sclera icterus Neck-Supple Neck,No JVD,.  Lungs-  mostly clear, fair air movement CV- S1, S2 normal, irregular Abd-  +ve B.Sounds, Abd Soft, No tenderness,    Extremity/Skin:- No  edema,   +pulses  Psych-baseline/underlying cognitive and memory deficits,  neuro-Lt UE weakness with contracture, no additional new focal deficits, has Tics/Tremors   Data Reviewed: I have personally reviewed following  labs and imaging studies  CBC: Recent Labs  Lab 04/14/21 1339 04/15/21 0438 04/16/21 0431 04/19/21 0429  WBC 8.4 8.3 8.3 7.9  NEUTROABS 6.9  --   --   --   HGB 10.5* 9.7* 9.8* 10.0*  HCT 32.8* 30.4* 31.3* 31.1*  MCV 88.9 89.1 90.5 88.6  PLT 295 251 279 256    Basic Metabolic Panel: Recent Labs  Lab 04/14/21 1914 04/15/21 0438 04/16/21 0431 04/16/21 2019 04/17/21 2333 04/18/21 1518 04/19/21 0429  NA  --  136 135 135  --  139 135  K  --  3.2* 3.3* 4.3  --  3.3* 4.0  CL  --  104 104 106  --  107 106  CO2  --  '25 24 24  ' --  23 22  GLUCOSE   --  106* 111* 118*  --  123* 186*  BUN  --  '13 12 12  ' --  7* 5*  CREATININE  --  0.78 0.77 0.77  --  0.64 0.66  CALCIUM  --  7.8* 8.0* 7.9*  --  8.0* 8.0*  MG 1.7  --  1.5* 1.8 1.7 1.9  --   PHOS  --   --   --   --   --  2.2*  --     GFR: Estimated Creatinine Clearance: 48.1 mL/min (by C-G formula based on SCr of 0.66 mg/dL).  Liver Function Tests: Recent Labs  Lab 04/14/21 1339 04/16/21 2019 04/18/21 1518  AST 19 13*  --   ALT 11 9  --   ALKPHOS 66 52  --   BILITOT 0.6 0.4  --   PROT 5.7* 5.0*  --   ALBUMIN 2.7* 2.3* 2.6*    CBG: Recent Labs  Lab 04/19/21 2037 04/20/21 0014 04/20/21 0425 04/20/21 1123 04/20/21 1718  GLUCAP 194* 186* 162* 182* 146*     Recent Results (from the past 240 hour(s))  Urine culture     Status: Abnormal   Collection Time: 04/14/21  1:35 PM   Specimen: Urine, Catheterized  Result Value Ref Range Status   Specimen Description   Final    URINE, CATHETERIZED Performed at [facility], [address] [place], [place] [address]    Special Requests   Final    NONE Performed at [facility], [address] [place], [place] [address]    Culture >=100,000 COLONIES/mL PROTEUS MIRABILIS (A)  Final   Report Status 04/18/2021 FINAL  Final   Organism ID, Bacteria PROTEUS MIRABILIS (A)  Final      Susceptibility   Proteus mirabilis - MIC*    AMPICILLIN >=32 RESISTANT Resistant     CEFAZOLIN <=4 SENSITIVE Sensitive     CEFEPIME <=0.12 SENSITIVE Sensitive     CEFTRIAXONE <=0.25 SENSITIVE Sensitive     CIPROFLOXACIN >=4 RESISTANT Resistant     GENTAMICIN <=1 SENSITIVE Sensitive     IMIPENEM 1 SENSITIVE Sensitive     NITROFURANTOIN 128 RESISTANT Resistant     TRIMETH/SULFA <=20 SENSITIVE Sensitive     AMPICILLIN/SULBACTAM 4 SENSITIVE Sensitive     PIP/TAZO <=4 SENSITIVE Sensitive     * >=100,000 COLONIES/mL PROTEUS MIRABILIS  Blood Culture (routine x 2)     Status: None   Collection Time: 04/14/21  1:39 PM   Specimen: BLOOD LEFT HAND   Result Value Ref Range Status   Specimen Description   Final    BLOOD LEFT HAND BOTTLES DRAWN AEROBIC AND ANAEROBIC   Special Requests Blood Culture adequate  volume  Final   Culture   Final    NO GROWTH 5 DAYS Performed at [facility], [address], [place], [place] [address]    Report Status 04/19/2021 FINAL  Final  Blood Culture (routine x 2)     Status: Abnormal   Collection Time: 04/14/21  1:39 PM   Specimen: BLOOD RIGHT FOREARM  Result Value Ref Range Status   Specimen Description   Final    BLOOD RIGHT FOREARM BOTTLES DRAWN AEROBIC AND ANAEROBIC Performed at [facility], [address], [place], [place] [address]    Special Requests   Final    Blood Culture adequate volume Performed at [facility], [address] [place], [place] [address]    Culture  Setup Time   Final    GRAM POSITIVE RODS IN BOTH AEROBIC AND ANAEROBIC BOTTLES Gram Stain Report Called to,Read Back By and Verified With: HYLTON,L'@[email or web address]'  BY MATTHEWS, B 6.28.22 [facility] RESULT PREV. CALLED    Culture (A)  Final    PROPIONIBACTERIUM ACNES Standardized susceptibility testing for this organism is not available. Performed at [facility], [place] [address], [place], [place] [address]    Report Status 04/18/2021 FINAL  Final  Resp Panel by RT-PCR (Flu A&B, Covid) Nasopharyngeal Swab     Status: None   Collection Time: 04/14/21  1:54 PM   Specimen: Nasopharyngeal Swab; Nasopharyngeal(NP) swabs in vial transport medium  Result Value Ref Range Status   SARS Coronavirus 2 by RT PCR NEGATIVE NEGATIVE Final    Comment: (NOTE) SARS-CoV-2 target nucleic acids are NOT DETECTED.  The SARS-CoV-2 RNA is generally detectable in upper respiratory specimens during the acute phase of infection. The lowest concentration of SARS-CoV-2 viral copies this assay can detect is 138 copies/mL. A negative result does not preclude SARS-Cov-2 infection and should not be used as the sole basis for treatment or other  patient management decisions. A negative result may occur with  improper specimen collection/handling, submission of specimen other than nasopharyngeal swab, presence of viral mutation(s) within the areas targeted by this assay, and inadequate number of viral copies(<138 copies/mL). A negative result must be combined with clinical observations, patient history, and epidemiological information. The expected result is Negative.  Fact Sheet for Patients:  EntrepreneurPulse.com.au  Fact Sheet for Healthcare Providers:  IncredibleEmployment.be  This test is no t yet approved or cleared by the [place] FDA and  has been authorized for detection and/or diagnosis of SARS-CoV-2 by FDA under an Emergency Use Authorization (EUA). This EUA will remain  in effect (meaning this test can be used) for the duration of the COVID-19 declaration under Section 564(b)(1) of the Act, 21 U.S.C.section 360bbb-3(b)(1), unless the authorization is terminated  or revoked sooner.       Influenza A by PCR NEGATIVE NEGATIVE Final   Influenza B by PCR NEGATIVE NEGATIVE Final    Comment: (NOTE) The Xpert Xpress SARS-CoV-2/FLU/RSV plus assay is intended as an aid in the diagnosis of influenza from Nasopharyngeal swab specimens and should not be used as a sole basis for treatment. Nasal washings and aspirates are unacceptable for Xpert Xpress SARS-CoV-2/FLU/RSV testing.  Fact Sheet for Patients: EntrepreneurPulse.com.au  Fact Sheet for Healthcare Providers: IncredibleEmployment.be  This test is not yet approved or cleared by the [place] FDA and has been authorized for detection and/or diagnosis of SARS-CoV-2 by FDA under an Emergency Use Authorization (EUA). This EUA will remain in effect (meaning this test can be used) for the duration of  the COVID-19 declaration under Section 564(b)(1) of the Act, 21 U.S.C. section  360bbb-3(b)(1), unless the authorization is terminated or revoked.  Performed at [facility], [address], [place], [place] [address]   MRSA Next Gen by PCR, Nasal     Status: None   Collection Time: 04/14/21  6:15 PM   Specimen: Nasal Mucosa; Nasal Swab  Result Value Ref Range Status   MRSA by PCR Next Gen NOT DETECTED NOT DETECTED Final    Comment: (NOTE) The GeneXpert MRSA Assay (FDA approved for NASAL specimens only), is one component of a comprehensive MRSA colonization surveillance program. It is not intended to diagnose MRSA infection nor to guide or monitor treatment for MRSA infections. Test performance is not FDA approved in patients less than 73 years old. Performed at [facility], [address] [place], [place] [address]      Radiology Studies: No results found.   Scheduled Meds:  (feeding supplement) PROSource Plus  30 mL Oral TID BM   atorvastatin  20 mg Oral Daily   chlorhexidine  15 mL Mouth Rinse BID   Chlorhexidine Gluconate Cloth  6 each Topical Daily   enoxaparin (LOVENOX) injection  40 mg Subcutaneous Q24H   insulin aspart  0-5 Units Subcutaneous QHS   insulin aspart  0-9 Units Subcutaneous TID WC   mouth rinse  15 mL Mouth Rinse q12n4p   metoprolol tartrate  50 mg Oral BID   multivitamin with minerals  1 tablet Oral Daily   pantoprazole sodium  40 mg Per Tube Daily   vitamin B-12  500 mcg Oral Daily   Continuous Infusions:  sodium chloride 250 mL (04/17/21 0336)    ceFAZolin (ANCEF) IV Stopped (04/20/21 1446)   lacosamide (VIMPAT) IV Stopped (04/20/21 1140)   levETIRAcetam Stopped (04/20/21 1028)    LOS: 6 days   Roxan Hockey, MD [facility]  To contact the attending provider between 7A-7P or the covering provider during after hours 7P-7A, please log into the web site [email or web address] and access using universal [place] password for that web site. If you do not have the password, please call the hospital operator.  04/20/2021, 6:21  PM

## 2021-04-21 LAB — CBC
HCT: 29.4 % — ABNORMAL LOW (ref 36.0–46.0)
Hemoglobin: 9.4 g/dL — ABNORMAL LOW (ref 12.0–15.0)
MCH: 28.5 pg (ref 26.0–34.0)
MCHC: 32 g/dL (ref 30.0–36.0)
MCV: 89.1 fL (ref 80.0–100.0)
Platelets: 246 10*3/uL (ref 150–400)
RBC: 3.3 MIL/uL — ABNORMAL LOW (ref 3.87–5.11)
RDW: 14.7 % (ref 11.5–15.5)
WBC: 9 10*3/uL (ref 4.0–10.5)
nRBC: 0 % (ref 0.0–0.2)

## 2021-04-21 LAB — GLUCOSE, CAPILLARY
Glucose-Capillary: 174 mg/dL — ABNORMAL HIGH (ref 70–99)
Glucose-Capillary: 134 mg/dL — ABNORMAL HIGH (ref 70–99)
Glucose-Capillary: 72 mg/dL (ref 70–99)

## 2021-04-21 LAB — BASIC METABOLIC PANEL
Anion gap: 7 (ref 5–15)
BUN: 10 mg/dL (ref 8–23)
CO2: 24 mmol/L (ref 22–32)
Calcium: 8 mg/dL — ABNORMAL LOW (ref 8.9–10.3)
Chloride: 105 mmol/L (ref 98–111)
Creatinine, Ser: 0.66 mg/dL (ref 0.44–1.00)
GFR, Estimated: >60 mL/min (ref >60)
Glucose, Bld: 188 mg/dL — ABNORMAL HIGH (ref 70–99)
Potassium: 3.4 mmol/L — ABNORMAL LOW (ref 3.5–5.1)
Sodium: 136 mmol/L (ref 135–145)

## 2021-04-21 MED ORDER — ENSURE ENLIVE PO LIQD
237.0000 mL | Freq: Two times a day (BID) | ORAL | Status: DC
  Administered 2021-04-22: 237 mL via ORAL
  Administered 2021-04-24: 120 mL via ORAL

## 2021-04-21 NOTE — Plan of Care (Signed)
  Problem: Clinical Measurements: Goal: Will remain free from infection Outcome: Progressing Goal: Diagnostic test results will improve Outcome: Progressing Goal: Respiratory complications will improve Outcome: Progressing Goal: Cardiovascular complication will be avoided Outcome: Progressing   Problem: Coping: Goal: Level of anxiety will decrease Outcome: Progressing   Problem: Elimination: Goal: Will not experience complications related to bowel motility Outcome: Progressing Goal: Will not experience complications related to urinary retention Outcome: Progressing   Problem: Pain Managment: Goal: General experience of comfort will improve Outcome: Progressing   Problem: Safety: Goal: Ability to remain free from injury will improve Outcome: Progressing   Problem: Skin Integrity: Goal: Risk for impaired skin integrity will decrease Outcome: Progressing

## 2021-04-21 NOTE — Progress Notes (Signed)
PROGRESS NOTE    Rebecca Hardy  MCN:[id number] DOB: 05/16/1933 DOA: 04/14/2021 PCP: Bonnita Nasuti, MD    Chief Complaint  Patient presents with   Seizures    Brief admission Narrative:  Rebecca Hardy is a 87 y.o. female with medical history significant for hypertension, CVA with left-sided weakness, dementia, diabetes mellitus.  Patient was brought to the ED from facility reports of possible seizure activity, patient's temperature was 103 per nursing staff.  Patient was seizing for about 5 minutes prior to EMS arrival, 2.5 mg of Versed was given patient stopped seizing for about 1 and half  minute minutes, and then repeat dose of 2.5 mg of Versed was given. EDP talked to nursing home staff, reported at about 10:30 AM patient was okay, and about noon they went to feed her and patient was shaking but still able to talk, and was oriented x1 this is her baseline.  Does reported that this has happened in the past, and was due to hypoglycemia.  Her blood sugars have been elevated.  Also reports wounds to her feet which were x-rayed and negative for osteomyelitis. On my evaluation patient is awake, alert, verbalizing able to tell me her name but not answer subsequent questions.   ED Course: Febrile to 101.7.  Tachycardic 156, respiratory rate 12-22.  Blood pressure systolic 62-947.  O2 sats greater than 98% on room air.  Lactic acid 3 > 2.  WBC 8.4.  UA with large leukocytes.  Head CT without acute abnormality.  Port chest x-ray unremarkable.  500 mils normal saline bolus given in route to ED, 1500 mils sepsis fluid bolus given in ED.  EDP talked to cardiologist on-call- Dr. Doree Albee, he also initial concern that this was atrial flutter.  And recommendation was to give IV metoprolol and if not effective start IV amiodarone.  Assessment & Plan:  1)Severe Sepsis with septic shock due to Proteus Mirabilis UTI-- -Patient met severe sepsis criteria on exam secondary to UTI; high-grade temperature,  elevated heart rate, elevated lactic acid and acute kidney injury. -Urine Cx from 04/14/21  with Proteus Mirabilis --sensitivities pending Blood Cx from 04/14/21 with PROPIONIBACTERIUM ACNES--suspect is a contaminant -COVID test negative -Treated with IV Rocephin 2 gm daily for Proteus mirabilis, de-escalated to Ancef ---patient has now been weaned off Levophed with improved BP  2)New Onset Seizures----patient with history of prior strokes -EEG from 04/17/2021 noted -Continue Keppra and Vimpat--both were started on this admission -May use lorazepam as needed -Official neuro consult appreciated  3)-Atrial flutter with RVR -weaned off amiodarone iv drip  C/n metoprolol 50 mg bid -Cardiology consult appreciated -2D echo with EF of 55 to 60 % -No anticoagulation in the setting of advanced dementia and high risk for falls. -CHADSVASC score >5 -Continue Aspirin  4)History of stroke/TIA with Left-sided weakness -CT head shows old  right MCA and right PCA infarcts. -Continue aspirin and and lipitor -No additional new  Acutefocal deficits appreciated.  5)-hypokalemia/Hypomagnesemia--- replace and recheck, please see #3 above  6)-Essential hypertension -Stable overall -Continue adjusted dose of metoprolol.  7)-acute kidney injury on CKD (chronic kidney disease) stage 3a, GFR 30-59 ml/min (HCC) -In the setting of dehydration and UTI -Back to baseline after fluid resuscitation -Continue treatment with antibiotic -Follow renal function trend -Minimize using nephrotoxic agents, hypotension and the use of contrast.  8-Type 2 diabetes mellitus with hyperlipidemia (HCC) Use Novolog/Humalog Sliding scale insulin with Accu-Cheks/Fingersticks as ordered  9-Dementia ([place]) -Continue constant reorientation supportive care. -No behavioral  disturbances at this time  10)Gastroesophageal reflux disease/GI prophylaxis -Continue PPI.  11)Chronic Anemia-- Hgb is 9.8 which is close to baseline, no  bleeding concerns  12) social/ethics--- palliative care consult appreciated, family request full scope of treatment, except for intubation, ACLS drugs and CPR okay -Awaiting family decision regarding goals of care to help determine disposition, if family wants full scope of treatment without limitation patient may have to go to SNF rehab, however family wants to de-escalate care and transition to more comfort based measures patient may be able to go back to ALF with palliative or hospice following  Pt has 5 kids (3 sons --Rebecca Hardy, Rebecca Hardy and Rebecca Hardy and 2 daughters ---Rebecca Hardy and Rebecca Hardy)  13)FEN/dysphagia--poor oral intake partially due to altered mentation in the setting of recurrent seizures and postictal state, IV fluids pending better tolerance of oral intake -Speech pathologist eval appreciated--recommends Dysphagia 1 (puree);Nectar-thick liquid  14)Generalized weakness--PT eval requested  DVT prophylaxis: Lovenox Code Status: Partial code Family Communication:-  palliative care provider spoke with daughters Paul Half at [phone] and Rebecca Hardy [name] spoke with her ) -I updated Rebecca Hardy on 04/21/2021-   Disposition:   Status is: Inpatient  Remains inpatient appropriate because: Sepsis and new onset seizures requiring IV antibiotics and IV Keppra  Dispo: The patient is from: [facility]              Anticipated d/c is to: Discharge back to facility              Patient currently is not medically stable to d/c.   Difficult to place patient No -Awaiting family decision regarding goals of care to help determine disposition, if family wants full scope of treatment without limitation patient may have to go to SNF rehab, however family wants to de-escalate care and transition to more comfort based measures patient may be able to go back to ALF with palliative or hospice following      Consultants:  Cardiology service Palliative care service Neurology  Procedures:  See below for  x-ray report 2D echo:  Antimicrobials: Ceftriaxone   Subjective:  -Opens eyes, able to answer simple questions, oral intake remains challenging -Her son Visited  Objective: Vitals:   04/21/21 0958 04/21/21 1000 04/21/21 1100 04/21/21 1141  BP: 130/82 (!) 115/53 (!) 146/99   Pulse: 62 (!) 57 73 (!) 58  Resp:  _0 Temp:    97.8 F (36.6 C)  TempSrc:    Axillary  SpO2:  100% 100% 98%  Weight:      Height:        Intake/Output Summary (Last 24 hours) at 04/21/2021 1413 Last data filed at 04/21/2021 1100 Gross per 24 hour  Intake 1126.03 ml  Output 200 ml  Net 926.03 ml   Filed Weights   04/14/21 1816 04/20/21 0321 04/21/21 0500  Weight: 61.6 kg 68.4 kg 68.6 kg   Examination:  Physical Exam Gen:-she wakes up to answer simple questions  HEENT:- [place].AT, No sclera icterus Neck-Supple Neck,No JVD,.  Lungs-  mostly clear, fair air movement CV- S1, S2 normal, irregular Abd-  +ve B.Sounds, Abd Soft, No tenderness,    Extremity/Skin:- No  edema,   +pulses  Psych-baseline/underlying cognitive and memory deficits,  neuro-Lt UE weakness with contracture, no additional new focal deficits, has Tics/Tremors   Data Reviewed: I have personally reviewed following labs and imaging studies  CBC: Recent Labs  Lab 04/15/21 0438 04/16/21 0431 04/19/21 0429 04/21/21 0341  WBC 8.3 8.3 7.9 9.0  HGB 9.7* 9.8* 10.0* 9.4*  HCT 30.4* 31.3* 31.1* 29.4*  MCV 89.1 90.5 88.6 89.1  PLT 251 279 270 694    Basic Metabolic Panel: Recent Labs  Lab 04/14/21 1914 04/15/21 0438 04/16/21 0431 04/16/21 2019 04/17/21 2333 04/18/21 1518 04/19/21 0429 04/21/21 0341  NA  --    < > 135 135  --  139 135 136  K  --    < > 3.3* 4.3  --  3.3* 4.0 3.4*  CL  --    < > 104 106  --  107 106 105  CO2  --    < > 24 24  --  _0 GLUCOSE  --    < > 111* 118*  --  123* 186* 188*  BUN  --    < > 12 12  --  7* 5* 10  CREATININE  --    < > 0.77 0.77  --  0.64 0.66 0.66  CALCIUM  --    < > 8.0*  7.9*  --  8.0* 8.0* 8.0*  MG 1.7  --  1.5* 1.8 1.7 1.9  --   --   PHOS  --   --   --   --   --  2.2*  --   --    < > = values in this interval not displayed.    GFR: Estimated Creatinine Clearance: 47.3 mL/min (by C-G formula based on SCr of 0.66 mg/dL).  Liver Function Tests: Recent Labs  Lab 04/16/21 2019 04/18/21 1518  AST 13*  --   ALT 9  --   ALKPHOS 52  --   BILITOT 0.4  --   PROT 5.0*  --   ALBUMIN 2.3* 2.6*    CBG: Recent Labs  Lab 04/20/21 0014 04/20/21 0425 04/20/21 1123 04/20/21 1718 04/21/21 0826  GLUCAP 186* 162* 182* 146* 174*     Recent Results (from the past 240 hour(s))  Urine culture     Status: Abnormal   Collection Time: 04/14/21  1:35 PM   Specimen: Urine, Catheterized  Result Value Ref Range Status   Specimen Description   Final    URINE, CATHETERIZED Performed at [facility], [address] [place], [place] [address]    Special Requests   Final    NONE Performed at [facility], [address], [place], [place] [address]    Culture >=100,000 COLONIES/mL PROTEUS MIRABILIS (A)  Final   Report Status 04/18/2021 FINAL  Final   Organism ID, Bacteria PROTEUS MIRABILIS (A)  Final      Susceptibility   Proteus mirabilis - MIC*    AMPICILLIN >=32 RESISTANT Resistant     CEFAZOLIN <=4 SENSITIVE Sensitive     CEFEPIME <=0.12 SENSITIVE Sensitive     CEFTRIAXONE <=0.25 SENSITIVE Sensitive     CIPROFLOXACIN >=4 RESISTANT Resistant     GENTAMICIN <=1 SENSITIVE Sensitive     IMIPENEM 1 SENSITIVE Sensitive     NITROFURANTOIN 128 RESISTANT Resistant     TRIMETH/SULFA <=20 SENSITIVE Sensitive     AMPICILLIN/SULBACTAM 4 SENSITIVE Sensitive     PIP/TAZO <=4 SENSITIVE Sensitive     * >=100,000 COLONIES/mL PROTEUS MIRABILIS  Blood Culture (routine x 2)     Status: None   Collection Time: 04/14/21  1:39 PM   Specimen: BLOOD LEFT HAND  Result Value Ref Range Status   Specimen Description   Final    BLOOD LEFT HAND BOTTLES DRAWN AEROBIC AND ANAEROBIC  Special Requests Blood Culture adequate volume  Final   Culture   Final    NO GROWTH 5 DAYS Performed at [facility], [address] [place], [place] [address]    Report Status 04/19/2021 FINAL  Final  Blood Culture (routine x 2)     Status: Abnormal   Collection Time: 04/14/21  1:39 PM   Specimen: BLOOD RIGHT FOREARM  Result Value Ref Range Status   Specimen Description   Final    BLOOD RIGHT FOREARM BOTTLES DRAWN AEROBIC AND ANAEROBIC Performed at [facility], [address] [place], [place] [address]    Special Requests   Final    Blood Culture adequate volume Performed at [facility], [address] [place], [place] [address]    Culture  Setup Time   Final    GRAM POSITIVE RODS IN BOTH AEROBIC AND ANAEROBIC BOTTLES Gram Stain Report Called to,Read Back By and Verified With: HYLTON,L_0  BY MATTHEWS, B 6.28.22 [facility] RESULT PREV. CALLED    Culture (A)  Final    PROPIONIBACTERIUM ACNES Standardized susceptibility testing for this organism is not available. Performed at [facility], [place] [address], [place],  [address]    Report Status 04/18/2021 FINAL  Final  Resp Panel by RT-PCR (Flu A&B, Covid) Nasopharyngeal Swab     Status: None   Collection Time: 04/14/21  1:54 PM   Specimen: Nasopharyngeal Swab; Nasopharyngeal(NP) swabs in vial transport medium  Result Value Ref Range Status   SARS Coronavirus 2 by RT PCR NEGATIVE NEGATIVE Final    Comment: (NOTE) SARS-CoV-2 target nucleic acids are NOT DETECTED.  The SARS-CoV-2 RNA is generally detectable in upper respiratory specimens during the acute phase of infection. The lowest concentration of SARS-CoV-2 viral copies this assay can detect is 138 copies/mL. A negative result does not preclude SARS-Cov-2 infection and should not be used as the sole basis for treatment or other patient management decisions. A negative result may occur with  improper specimen collection/handling, submission of  specimen other than nasopharyngeal swab, presence of viral mutation(s) within the areas targeted by this assay, and inadequate number of viral copies(<138 copies/mL). A negative result must be combined with clinical observations, patient history, and epidemiological information. The expected result is Negative.  Fact Sheet for Patients:  EntrepreneurPulse.com.au  Fact Sheet for Healthcare Providers:  IncredibleEmployment.be  This test is no t yet approved or cleared by the [place] FDA and  has been authorized for detection and/or diagnosis of SARS-CoV-2 by FDA under an Emergency Use Authorization (EUA). This EUA will remain  in effect (meaning this test can be used) for the duration of the COVID-19 declaration under Section 564(b)(1) of the Act, 21 U.S.C.section 360bbb-3(b)(1), unless the authorization is terminated  or revoked sooner.       Influenza A by PCR NEGATIVE NEGATIVE Final   Influenza B by PCR NEGATIVE NEGATIVE Final    Comment: (NOTE) The Xpert Xpress SARS-CoV-2/FLU/RSV plus assay is intended as an aid in the diagnosis of influenza from Nasopharyngeal swab specimens and should not be used as a sole basis for treatment. Nasal washings and aspirates are unacceptable for Xpert Xpress SARS-CoV-2/FLU/RSV testing.  Fact Sheet for Patients: EntrepreneurPulse.com.au  Fact Sheet for Healthcare Providers: IncredibleEmployment.be  This test is not yet approved or cleared by the [place] FDA and has been authorized for detection and/or diagnosis of SARS-CoV-2 by FDA under an Emergency Use Authorization (EUA). This EUA will remain in effect (meaning this test can be  used) for the duration of the COVID-19 declaration under Section 564(b)(1) of the Act, 21 U.S.C. section 360bbb-3(b)(1), unless the authorization is terminated or revoked.  Performed at [facility], [address]  [place], [place] [address]   MRSA Next Gen by PCR, Nasal     Status: None   Collection Time: 04/14/21  6:15 PM   Specimen: Nasal Mucosa; Nasal Swab  Result Value Ref Range Status   MRSA by PCR Next Gen NOT DETECTED NOT DETECTED Final    Comment: (NOTE) The GeneXpert MRSA Assay (FDA approved for NASAL specimens only), is one component of a comprehensive MRSA colonization surveillance program. It is not intended to diagnose MRSA infection nor to guide or monitor treatment for MRSA infections. Test performance is not FDA approved in patients less than 84 years old. Performed at [facility], [address] [place], [place] [address]      Radiology Studies: No results found.   Scheduled Meds:  (feeding supplement) PROSource Plus  30 mL Oral TID BM   atorvastatin  20 mg Oral Daily   chlorhexidine  15 mL Mouth Rinse BID   Chlorhexidine Gluconate Cloth  6 each Topical Daily   enoxaparin (LOVENOX) injection  40 mg Subcutaneous Q24H   insulin aspart  0-5 Units Subcutaneous QHS   insulin aspart  0-9 Units Subcutaneous TID WC   mouth rinse  15 mL Mouth Rinse q12n4p   metoprolol tartrate  50 mg Oral BID   multivitamin with minerals  1 tablet Oral Daily   pantoprazole sodium  40 mg Per Tube Daily   vitamin B-12  500 mcg Oral Daily   Continuous Infusions:  sodium chloride 250 mL (04/17/21 0336)    ceFAZolin (ANCEF) IV 1 g (04/21/21 0539)   lacosamide (VIMPAT) IV 50 mg (04/21/21 1000)   levETIRAcetam 1,000 mg (04/21/21 0842)    LOS: 7 days   Roxan Hockey, MD [facility]  To contact the attending provider between 7A-7P or the covering provider during after hours 7P-7A, please log into the web site [email or web address] and access using universal [place] password for that web site. If you do not have the password, please call the hospital operator.  04/21/2021, 2:13 PM   ?'

## 2021-04-22 LAB — GLUCOSE, CAPILLARY
Glucose-Capillary: 139 mg/dL — ABNORMAL HIGH (ref 70–99)
Glucose-Capillary: 158 mg/dL — ABNORMAL HIGH (ref 70–99)
Glucose-Capillary: 180 mg/dL — ABNORMAL HIGH (ref 70–99)

## 2021-04-22 NOTE — Progress Notes (Signed)
Nutrition Follow-up  DOCUMENTATION CODES:  Not applicable  INTERVENTION:  Discontinue Ensure Enlive.  Add Mighty Shake II TID with meals, each supplement provides 480-500 kcals and 20-23 grams of protein.  Continue ProSource Plus TID.  Continue MVI with minerals daily.  If within [place], consider placing small bore NG tube and starting tube feeding. Osmolite 1.5 @ 20 ml/hr and increase by 10 ml every 8 hours to goal rate of 50 ml/hr (1200 ml total) ProSource TF 45 ml daily  Provides 1840 kcal, 86 grams of protein, and 912 ml of free water daily.  Monitor magnesium, potassium, and phosphorus daily for at least 3 days, MD to replete as needed, as pt is at risk for refeeding syndrome.  NUTRITION DIAGNOSIS:  Increased nutrient needs related to wound healing as evidenced by estimated needs. - ongoing  GOAL:  Patient will meet greater than or equal to 90% of their needs - not meeting  MONITOR:  PO intake, Supplement acceptance, Labs, Skin, Weight trends  REASON FOR ASSESSMENT:  Malnutrition Screening Tool    ASSESSMENT:  Patient is an 87 yo female with dementia, DM, GERD, Stroke (residual left sided weakness) and DCIS (left breast). She presents with shaking/seizure like activity, febrile. Severe sepsis likely due to UTI per MD. Increased heart rate.  Attempted to speak with patient or family at bedside. Pt very tired, did not awaken to touch or voice. RD noted that breakfast tray remains untouched.  Meal documentation in Epic is very limited. 0% was recorded on 6/29 for lunch and 0% on 7/1 for dinner.  Admit wt: 74 kg Current wt: 68.6 kg  Given pt's diet order of nectar thick liquids, RD to switch pt to [employer] given they are prethickened to nectar thick consistency. Will discontinue Ensure Enlive.  If within [place], may need to consider nutrition support sooner rather than later.  Medications: reviewed; PS TID, EE BID, SSI, MVI with minerals, Protonix, Vitamin B12, Ancef  TID via IV, Keppra BID via IV   Labs: reviewed; K 3.4 (L), CBG 72-174  Diet Order:   Diet Order             DIET - DYS 1 Room service appropriate? Yes; Fluid consistency: Nectar Thick  Diet effective now                  EDUCATION NEEDS:  Not appropriate for education at this time  Skin:  Skin Assessment: Skin Integrity Issues: Skin Integrity Issues:: Unstageable Unstageable: 3 UNstageable wounds (wrist, 2 toes) and a PI- right heel  Last BM:  04/21/21 - Type 7, medium  Height:  Ht Readings from Last 1 Encounters:  04/20/21 5\' 6"  (1.676 m)   Weight:  Wt Readings from Last 1 Encounters:  04/21/21 68.6 kg   BMI:  Body mass index is 24.41 kg/m.  Estimated Nutritional Needs:  Kcal:  1800-1900 Protein:  90-95 gr Fluid:  1.8-1.9 liters daily  Derrel Nip, RD, LDN (she/her/hers) Registered Dietitian I After-Hours/Weekend Pager # in [place]

## 2021-04-22 NOTE — Plan of Care (Signed)

## 2021-04-22 NOTE — Progress Notes (Signed)
PROGRESS NOTE    Rebecca Hardy  JJO:[id number] DOB: 1933-02-26 DOA: 04/14/2021 PCP: Bonnita Nasuti, MD    Chief Complaint  Patient presents with   Seizures    Brief admission Narrative:  Rebecca Hardy is a 87 y.o. female with medical history significant for hypertension, CVA with left-sided weakness, dementia, diabetes mellitus.  Patient was brought to the ED from facility reports of possible seizure activity, patient's temperature was 103 per nursing staff.  Patient was seizing for about 5 minutes prior to EMS arrival, 2.5 mg of Versed was given patient stopped seizing for about 1 and half  minute minutes, and then repeat dose of 2.5 mg of Versed was given. EDP talked to nursing home staff, reported at about 10:30 AM patient was okay, and about noon they went to feed her and patient was shaking but still able to talk, and was oriented x1 this is her baseline.  Does reported that this has happened in the past, and was due to hypoglycemia.  Her blood sugars have been elevated.  Also reports wounds to her feet which were x-rayed and negative for osteomyelitis. On my evaluation patient is awake, alert, verbalizing able to tell me her name but not answer subsequent questions.   ED Course: Febrile to 101.7.  Tachycardic 156, respiratory rate 12-22.  Blood pressure systolic 16-010.  O2 sats greater than 98% on room air.  Lactic acid 3 > 2.  WBC 8.4.  UA with large leukocytes.  Head CT without acute abnormality.  Port chest x-ray unremarkable.  500 mils normal saline bolus given in route to ED, 1500 mils sepsis fluid bolus given in ED.  EDP talked to cardiologist on-call- Dr. Doree Albee, he also initial concern that this was atrial flutter.  And recommendation was to give IV metoprolol and if not effective start IV amiodarone.  Assessment & Plan:  1)Severe Sepsis with septic shock due to Proteus Mirabilis UTI-- -Patient met severe sepsis criteria on exam secondary to UTI; high-grade temperature,  elevated heart rate, elevated lactic acid and acute kidney injury. -Urine Cx from 04/14/21  with Proteus Mirabilis  Blood Cx from 04/14/21 with PROPIONIBACTERIUM ACNES--suspect is a contaminant -COVID test negative -Treated with IV Rocephin 2 gm daily for Proteus mirabilis, de-escalated to Ancef-last dose of Ancef 04/22/2021 ---patient has now been weaned off Levophed with improved BP  2)New Onset Seizures----patient with history of prior strokes -EEG from 04/17/2021 noted -Continue Keppra and Vimpat--both were started on this admission -May use lorazepam as needed -Official neuro consult appreciated -Patient remains pretty sleepy/drowsy-with ongoing tics , shakes, tremors  3)-Atrial flutter with RVR -weaned off amiodarone iv drip  C/n metoprolol 50 mg bid -Cardiology consult appreciated -2D echo with EF of 55 to 60 % -No anticoagulation in the setting of advanced dementia and high risk for falls. -CHADSVASC score >5 -Continue Aspirin  4)History of stroke/TIA with Left-sided weakness -CT head shows old  right MCA and right PCA infarcts. -Continue aspirin and and lipitor -No additional new  Acute focal deficits appreciated. -Patient with significant metabolic encephalopathy, pretty sleepy/drowsy  5)-hypokalemia/Hypomagnesemia--- replace and recheck, please see #3 above  6)-Essential hypertension -Stable overall -Continue adjusted dose of metoprolol.  7)-acute kidney injury on CKD (chronic kidney disease) stage 3a, GFR 30-59 ml/min (HCC) -In the setting of dehydration and UTI -Minimize using nephrotoxic agents, hypotension and the use of contrast.  8-Type 2 diabetes mellitus with hyperlipidemia (HCC) Use Novolog/Humalog Sliding scale insulin with Accu-Cheks/Fingersticks as ordered  9-Dementia [facility]) --Patient  with significant metabolic encephalopathy, pretty sleepy/drowsy  10)Gastroesophageal reflux disease/GI prophylaxis -Continue PPI.  11)Chronic Anemia-- Hgb is is close to  baseline, no bleeding concerns  12) social/ethics--- palliative care consult appreciated, family request full scope of treatment, except for intubation, ACLS drugs and CPR okay -Awaiting family decision regarding goals of care to help determine disposition, if family wants full scope of treatment without limitation patient may have to go to SNF rehab, however if family wants to de-escalate care and transition to more comfort based measures patient may be able to go back to ALF with palliative or hospice following  Pt has 5 kids (3 sons --Ginnie Smart, Marcelina Morel and Donion and 2 daughters ---Jocelyn Lamer and Shirlean Mylar)  13)FEN/dysphagia--poor oral intake partially due to altered mentation in the setting of recurrent seizures and postictal state, IV fluids pending better tolerance of oral intake -Speech pathologist eval appreciated--recommends Dysphagia 1 (puree);Nectar-thick liquid---Patient with significant metabolic encephalopathy, pretty sleepy/drowsy--making oral intake not very safe at times -Dietitian recommends NG/OG tube for feeding purposes-awaiting family's decision  14) acute metabolic encephalopathy--Patient with significant metabolic encephalopathy, pretty sleepy/drowsy--- please see above  DVT prophylaxis: Lovenox Code Status: Partial code Family Communication:-  palliative care provider spoke with daughters Paul Half at [phone] and Jocelyn Lamer [name] spoke with her ) -I updated Jocelyn Lamer on 04/21/2021-  I updated daughter Shirlean Mylar on 04/22/2021  Disposition:   Status is: Inpatient  Remains inpatient appropriate because: Sepsis and new onset seizures requiring IV antibiotics and IV Keppra  Dispo: The patient is from: [place]              Anticipated d/c is to: Discharge back to facility              Patient currently is not medically stable to d/c.   Difficult to place patient No -Awaiting family decision regarding goals of care to help determine disposition, if family wants full scope of  treatment without limitation patient may have to go to SNF rehab, however if family wants to de-escalate care and transition to more comfort based measures patient may be able to go back to ALF with palliative or hospice following      Consultants:  Cardiology service Palliative care service Neurology  Procedures:  See below for x-ray report 2D echo:  Antimicrobials: Ceftriaxone   Subjective:  Daughter-in-law and granddaughter visited -Her youngest son Donion is coming to visit on 04/23/2021 he lives in [facility] - -Patient with significant metabolic encephalopathy, pretty sleepy/drowsy--this makes oral intake difficult and at times unsafe -Awaiting family decision regarding direction of care -Spoke with daughter Shirlean Mylar on 04/22/2021  Objective: Vitals:   04/22/21 0800 04/22/21 0854 04/22/21 0900 04/22/21 1222  BP: (!) 130/42  126/66   Pulse: 64  65   Resp: 14  12   Temp:  (!) 97.5 F (36.4 C)  98 F (36.7 C)  TempSrc:  Axillary  Axillary  SpO2: 99%  100%   Weight:      Height:        Intake/Output Summary (Last 24 hours) at 04/22/2021 1545 Last data filed at 04/22/2021 0726 Gross per 24 hour  Intake 485.74 ml  Output 750 ml  Net -264.26 ml   Filed Weights   04/14/21 1816 04/20/21 0321 04/21/21 0500  Weight: 61.6 kg 68.4 kg 68.6 kg   Examination:  Physical Exam Gen:-Overall very sleepy, she wakes up to answer simple questions  HEENT:- [place].AT, No sclera icterus Neck-Supple Neck,No JVD,.  Lungs-  mostly clear, fair  air movement CV- S1, S2 normal, irregular Abd-  +ve B.Sounds, Abd Soft, No tenderness,    Extremity/Skin:- No  edema,   +pulses  Psych-baseline/underlying cognitive and memory deficits,  --Patient with significant metabolic encephalopathy, pretty sleepy/drowsy neuro-Lt UE weakness with contracture, no additional new focal deficits, has Tics/Tremors and shakes    Data Reviewed: I have personally reviewed following labs and imaging  studies  CBC: Recent Labs  Lab 04/16/21 0431 04/19/21 0429 04/21/21 0341  WBC 8.3 7.9 9.0  HGB 9.8* 10.0* 9.4*  HCT 31.3* 31.1* 29.4*  MCV 90.5 88.6 89.1  PLT 279 270 673    Basic Metabolic Panel: Recent Labs  Lab 04/16/21 0431 04/16/21 2019 04/17/21 2333 04/18/21 1518 04/19/21 0429 04/21/21 0341  NA 135 135  --  139 135 136  K 3.3* 4.3  --  3.3* 4.0 3.4*  CL 104 106  --  107 106 105  CO2 24 24  --  _0 GLUCOSE 111* 118*  --  123* 186* 188*  BUN 12 12  --  7* 5* 10  CREATININE 0.77 0.77  --  0.64 0.66 0.66  CALCIUM 8.0* 7.9*  --  8.0* 8.0* 8.0*  MG 1.5* 1.8 1.7 1.9  --   --   PHOS  --   --   --  2.2*  --   --     GFR: Estimated Creatinine Clearance: 47.3 mL/min (by C-G formula based on SCr of 0.66 mg/dL).  Liver Function Tests: Recent Labs  Lab 04/16/21 2019 04/18/21 1518  AST 13*  --   ALT 9  --   ALKPHOS 52  --   BILITOT 0.4  --   PROT 5.0*  --   ALBUMIN 2.3* 2.6*    CBG: Recent Labs  Lab 04/21/21 0826 04/21/21 1634 04/21/21 2132 04/22/21 0813 04/22/21 1145  GLUCAP 174* 134* 72 139* 158*     Recent Results (from the past 240 hour(s))  Urine culture     Status: Abnormal   Collection Time: 04/14/21  1:35 PM   Specimen: Urine, Catheterized  Result Value Ref Range Status   Specimen Description   Final    URINE, CATHETERIZED Performed at [facility], [address], [place], [place] [address]    Special Requests   Final    NONE Performed at [facility], [address], [place], [place] [address]    Culture >=100,000 COLONIES/mL PROTEUS MIRABILIS (A)  Final   Report Status 04/18/2021 FINAL  Final   Organism ID, Bacteria PROTEUS MIRABILIS (A)  Final      Susceptibility   Proteus mirabilis - MIC*    AMPICILLIN >=32 RESISTANT Resistant     CEFAZOLIN <=4 SENSITIVE Sensitive     CEFEPIME <=0.12 SENSITIVE Sensitive     CEFTRIAXONE <=0.25 SENSITIVE Sensitive     CIPROFLOXACIN >=4 RESISTANT Resistant     GENTAMICIN <=1 SENSITIVE  Sensitive     IMIPENEM 1 SENSITIVE Sensitive     NITROFURANTOIN 128 RESISTANT Resistant     TRIMETH/SULFA <=20 SENSITIVE Sensitive     AMPICILLIN/SULBACTAM 4 SENSITIVE Sensitive     PIP/TAZO <=4 SENSITIVE Sensitive     * >=100,000 COLONIES/mL PROTEUS MIRABILIS  Blood Culture (routine x 2)     Status: None   Collection Time: 04/14/21  1:39 PM   Specimen: BLOOD LEFT HAND  Result Value Ref Range Status   Specimen Description   Final    BLOOD LEFT HAND BOTTLES DRAWN AEROBIC AND ANAEROBIC  Special Requests Blood Culture adequate volume  Final   Culture   Final    NO GROWTH 5 DAYS Performed at [facility], [address], [place], [place] [address]    Report Status 04/19/2021 FINAL  Final  Blood Culture (routine x 2)     Status: Abnormal   Collection Time: 04/14/21  1:39 PM   Specimen: BLOOD RIGHT FOREARM  Result Value Ref Range Status   Specimen Description   Final    BLOOD RIGHT FOREARM BOTTLES DRAWN AEROBIC AND ANAEROBIC Performed at [facility], [address] [place], [place] [address]    Special Requests   Final    Blood Culture adequate volume Performed at [facility], [address] [place], [place] [address]    Culture  Setup Time   Final    GRAM POSITIVE RODS IN BOTH AEROBIC AND ANAEROBIC BOTTLES Gram Stain Report Called to,Read Back By and Verified With: HYLTON,L_0  BY MATTHEWS, B 6.28.22 [facility] RESULT PREV. CALLED    Culture (A)  Final    PROPIONIBACTERIUM ACNES Standardized susceptibility testing for this organism is not available. Performed at [facility], [place] [address], [place], [place] [address]    Report Status 04/18/2021 FINAL  Final  Resp Panel by RT-PCR (Flu A&B, Covid) Nasopharyngeal Swab     Status: None   Collection Time: 04/14/21  1:54 PM   Specimen: Nasopharyngeal Swab; Nasopharyngeal(NP) swabs in vial transport medium  Result Value Ref Range Status   SARS Coronavirus 2 by RT PCR NEGATIVE NEGATIVE Final    Comment:  (NOTE) SARS-CoV-2 target nucleic acids are NOT DETECTED.  The SARS-CoV-2 RNA is generally detectable in upper respiratory specimens during the acute phase of infection. The lowest concentration of SARS-CoV-2 viral copies this assay can detect is 138 copies/mL. A negative result does not preclude SARS-Cov-2 infection and should not be used as the sole basis for treatment or other patient management decisions. A negative result may occur with  improper specimen collection/handling, submission of specimen other than nasopharyngeal swab, presence of viral mutation(s) within the areas targeted by this assay, and inadequate number of viral copies(<138 copies/mL). A negative result must be combined with clinical observations, patient history, and epidemiological information. The expected result is Negative.  Fact Sheet for Patients:  EntrepreneurPulse.com.au  Fact Sheet for Healthcare Providers:  IncredibleEmployment.be  This test is no t yet approved or cleared by the [place] FDA and  has been authorized for detection and/or diagnosis of SARS-CoV-2 by FDA under an Emergency Use Authorization (EUA). This EUA will remain  in effect (meaning this test can be used) for the duration of the COVID-19 declaration under Section 564(b)(1) of the Act, 21 U.S.C.section 360bbb-3(b)(1), unless the authorization is terminated  or revoked sooner.       Influenza A by PCR NEGATIVE NEGATIVE Final   Influenza B by PCR NEGATIVE NEGATIVE Final    Comment: (NOTE) The Xpert Xpress SARS-CoV-2/FLU/RSV plus assay is intended as an aid in the diagnosis of influenza from Nasopharyngeal swab specimens and should not be used as a sole basis for treatment. Nasal washings and aspirates are unacceptable for Xpert Xpress SARS-CoV-2/FLU/RSV testing.  Fact Sheet for Patients: EntrepreneurPulse.com.au  Fact Sheet for Healthcare  Providers: IncredibleEmployment.be  This test is not yet approved or cleared by the [place] FDA and has been authorized for detection and/or diagnosis of SARS-CoV-2 by FDA under an Emergency Use Authorization (EUA). This EUA will remain in effect (meaning this test can  be used) for the duration of the COVID-19 declaration under Section 564(b)(1) of the Act, 21 U.S.C. section 360bbb-3(b)(1), unless the authorization is terminated or revoked.  Performed at [facility], [address] [place], [place] [address]   MRSA Next Gen by PCR, Nasal     Status: None   Collection Time: 04/14/21  6:15 PM   Specimen: Nasal Mucosa; Nasal Swab  Result Value Ref Range Status   MRSA by PCR Next Gen NOT DETECTED NOT DETECTED Final    Comment: (NOTE) The GeneXpert MRSA Assay (FDA approved for NASAL specimens only), is one component of a comprehensive MRSA colonization surveillance program. It is not intended to diagnose MRSA infection nor to guide or monitor treatment for MRSA infections. Test performance is not FDA approved in patients less than 2 years old. Performed at [facility], [address] [place], [place] [address]      Radiology Studies: No results found.   Scheduled Meds:  (feeding supplement) PROSource Plus  30 mL Oral TID BM   atorvastatin  20 mg Oral Daily   chlorhexidine  15 mL Mouth Rinse BID   Chlorhexidine Gluconate Cloth  6 each Topical Daily   enoxaparin (LOVENOX) injection  40 mg Subcutaneous Q24H   feeding supplement  237 mL Oral BID BM   insulin aspart  0-5 Units Subcutaneous QHS   insulin aspart  0-9 Units Subcutaneous TID WC   mouth rinse  15 mL Mouth Rinse q12n4p   metoprolol tartrate  50 mg Oral BID   multivitamin with minerals  1 tablet Oral Daily   pantoprazole sodium  40 mg Per Tube Daily   vitamin B-12  500 mcg Oral Daily   Continuous Infusions:  sodium chloride 250 mL (04/17/21 0336)    ceFAZolin (ANCEF) IV 1 g (04/22/21 1433)    lacosamide (VIMPAT) IV 50 mg (04/22/21 1205)   levETIRAcetam 1,000 mg (04/22/21 0912)    LOS: 8 days   Roxan Hockey, MD [facility]  To contact the attending provider between 7A-7P or the covering provider during after hours 7P-7A, please log into the web site [email or web address] and access using universal [place] password for that web site. If you do not have the password, please call the hospital operator.  04/22/2021, 3:45 PM   ?'

## 2021-04-22 NOTE — Progress Notes (Signed)
  Speech Language Pathology Treatment: Dysphagia  Patient Details Name: Rebecca Hardy MRN: [id number] DOB: [birth year] Today's Date: 04/22/2021 Time: 1000-1023 SLP Time Calculation (min) (ACUTE ONLY): 23 min  Assessment / Plan / Recommendation Clinical Impression  Pt seen for ongoing dysphagia intervention. She continues to present with reduced alertness and twitches/tics. Vocal intensity is reduced and vocal quality mildly wet prior to po. She is unable to produce a strong cough upon request. RN reports limited po intake- takes a few bites and then starts expectorating. Pt allowed oral care and a few ice chips, small sips water with immediate/delayed cough, NTL via straw sips with one delayed cough, and a couple bites of puree. Pt alertness continues to wax and wane and negatively impacts ability for safe and efficient po intake. It appears family is still determining goals of care. Continue D1/NTL when Pt is alert and willing, acknowledge RD recommendation for alternative means of nutrition if within Pt's goals of care. Also, consider offering Magic Cup and [employer]- discussed with RN. SLP will continue to follow acutely. Above to MD.     HPI HPI: Rebecca Hardy is a 87 y.o. female with medical history significant for hypertension, CVA with left-sided weakness, dementia, diabetes mellitus.   Patient was brought to the ED from facility reports of possible seizure activity, patient's temperature was 103 per nursing staff.  Patient was seizing for about 5 minutes prior to EMS arrival, 2.5 mg of Versed was given patient stopped seizing for about 1 and half  minute minutes, and then repeat dose of 2.5 mg of Versed was given. Pt is a resident at [facility] and reportedly on a pureed diet. BSE requested.      SLP Plan  Continue with current plan of care       Recommendations  Diet recommendations: Dysphagia 1 (puree);Nectar-thick liquid Liquids provided via: Cup;Straw Medication  Administration: Crushed with puree Supervision: Staff to assist with self feeding;Full supervision/cueing for compensatory strategies Compensations: Slow rate;Small sips/bites Postural Changes and/or Swallow Maneuvers: Upright 30-60 min after meal;Seated upright 90 degrees                Oral Care Recommendations: Oral care BID;Staff/trained caregiver to provide oral care Follow up Recommendations: Skilled Nursing facility SLP Visit Diagnosis: Dysphagia, oropharyngeal phase (R13.12) Plan: Continue with current plan of care       Thank you,  Genene Churn, [place]                 [place] 04/22/2021, 10:28 AM

## 2021-04-23 LAB — GLUCOSE, CAPILLARY
Glucose-Capillary: 228 mg/dL — ABNORMAL HIGH (ref 70–99)
Glucose-Capillary: 187 mg/dL — ABNORMAL HIGH (ref 70–99)
Glucose-Capillary: 156 mg/dL — ABNORMAL HIGH (ref 70–99)
Glucose-Capillary: 147 mg/dL — ABNORMAL HIGH (ref 70–99)

## 2021-04-23 LAB — BASIC METABOLIC PANEL
Anion gap: 5 (ref 5–15)
BUN: 14 mg/dL (ref 8–23)
CO2: 23 mmol/L (ref 22–32)
Calcium: 7.8 mg/dL — ABNORMAL LOW (ref 8.9–10.3)
Chloride: 108 mmol/L (ref 98–111)
Creatinine, Ser: 0.69 mg/dL (ref 0.44–1.00)
GFR, Estimated: >60 mL/min (ref >60)
Glucose, Bld: 272 mg/dL — ABNORMAL HIGH (ref 70–99)
Potassium: 3.5 mmol/L (ref 3.5–5.1)
Sodium: 136 mmol/L (ref 135–145)

## 2021-04-23 LAB — CBC
HCT: 27.9 % — ABNORMAL LOW (ref 36.0–46.0)
Hemoglobin: 8.8 g/dL — ABNORMAL LOW (ref 12.0–15.0)
MCH: 28.2 pg (ref 26.0–34.0)
MCHC: 31.5 g/dL (ref 30.0–36.0)
MCV: 89.4 fL (ref 80.0–100.0)
Platelets: 248 10*3/uL (ref 150–400)
RBC: 3.12 MIL/uL — ABNORMAL LOW (ref 3.87–5.11)
RDW: 14.9 % (ref 11.5–15.5)
WBC: 9.1 10*3/uL (ref 4.0–10.5)
nRBC: 0 % (ref 0.0–0.2)

## 2021-04-23 MED ORDER — PANTOPRAZOLE SODIUM 40 MG PO TBEC
40.0000 mg | DELAYED_RELEASE_TABLET | Freq: Every day | ORAL | Status: DC
  Administered 2021-04-23 – 2021-04-24 (×2): 40 mg via ORAL
  Filled 2021-04-23 (×2): qty 1

## 2021-04-23 NOTE — Plan of Care (Signed)

## 2021-04-23 NOTE — TOC Initial Note (Signed)
Transition of Care [facility]) - Initial/Assessment Note    Patient Details  Name: Rebecca Hardy MRN: [id number] Date of Birth: 06/24/1933  Transition of Care [facility] CM/SW Contact:    Natasha Bence, LCSW Phone Number: 04/23/2021, 4:44 PM  Clinical Narrative:                 Patient is a 87 year old female admitted for Sepsis. Patient recommended for hospice. Patient's daughter agreeable to [facility] hospice CSW placed referral with Cassandra with [facility] hospice. TOC to follow.  Expected Discharge Plan: [place] Barriers to Discharge: Continued Medical Work up   Patient Goals and CMS Choice Patient states their goals for this hospitalization and ongoing recovery are:: Residential hospice CMS Medicare.gov Compare Post Acute Care list provided to:: Patient Choice offered to / list presented to : Patient  Expected Discharge Plan and Services Expected Discharge Plan: [place]       Living arrangements for the past 2 months: [place]                                      Prior Living Arrangements/Services Living arrangements for the past 2 months: [place] Lives with:: Self, Adult Children Patient language and need for interpreter reviewed:: Yes Do you feel safe going back to the place where you live?: Yes      Need for Family Participation in Patient Care: Yes (Comment) Care giver support system in place?: Yes (comment) Current home services: DME Criminal Activity/Legal Involvement Pertinent to Current Situation/Hospitalization: No - Comment as needed  Activities of Daily Living Home Assistive Devices/Equipment: None ADL Screening (condition at time of admission) Patient's cognitive ability adequate to safely complete daily activities?: No Is the patient deaf or have difficulty hearing?: No Does the patient have difficulty seeing, even when wearing glasses/contacts?: No Does the patient have difficulty concentrating,  remembering, or making decisions?: Yes Patient able to express need for assistance with ADLs?: No Does the patient have difficulty dressing or bathing?: Yes Independently performs ADLs?: No Does the patient have difficulty walking or climbing stairs?: Yes Weakness of Legs: Both Weakness of Arms/Hands: Both  Permission Sought/Granted Permission sought to share information with : Family Supports Permission granted to share information with : Yes, Verbal Permission Granted  Share Information with NAME: miller,Robin  Permission granted to share info w AGENCY: Hospice  Permission granted to share info w Relationship: (Daughter)  Permission granted to share info w Contact Information: [phone]  Emotional Assessment       Orientation: : Oriented to Self Alcohol / Substance Use: Not Applicable Psych Involvement: No (comment)  Admission diagnosis:  Acute cystitis with hematuria [N30.01] Sepsis ([place]) [A41.9] Sepsis, due to unspecified organism, unspecified whether acute organ dysfunction present [facility]) [A41.9] Patient Active Problem List   Diagnosis Date Noted   Seizures ([place]) 04/17/2021   Proteus mirabilis infection/UTI 04/17/2021   Sepsis ([place]) 04/14/2021   Dementia ([place]) 04/14/2021   COVID-19 virus infection    Thrombocytopenia ([place])    Hx of transient ischemic attack (TIA)    Acute renal failure superimposed on stage 3b chronic kidney disease ([place]) 11/21/2020   AKI (acute kidney injury) ([place]) 11/20/2020   Acute ischemic cerebrovascular accident (CVA) involving right middle cerebral artery territory ([place]) 08/20/2018   Narrow complex tachycardia ([place]) 08/20/2018   Left-sided weakness 08/19/2018   Essential hypertension 08/19/2018   HLD (hyperlipidemia)  08/19/2018   CKD (chronic kidney disease) stage 3, GFR 30-59 ml/min ([place]) 08/19/2018   Type 2 diabetes mellitus with hyperlipidemia ([place]) 08/19/2018   PCP:  Bonnita Nasuti, MD Pharmacy:   [place], [address] [place] [address] Phone: [phone] Fax: [phone]     Social Determinants of Health (SDOH) Interventions    Readmission Risk Interventions No flowsheet data found.

## 2021-04-23 NOTE — Progress Notes (Signed)
Palliative: Rebecca Hardy is lying quietly in bed.  She appears acutely/chronically ill, frail and elderly.  She will open her eyes, and briefly make but not keep eye contact.  She is able to tell me her name, but does not answer other orientation questions.  I am not sure that she can make her basic needs known.  There is no family at bedside at this time.  I offer her something to drink and she is able to sip nectar thick liquids from a straw.  She drinks about half of the prepared orange juice, and looks tired.  Family conference at bedside with daughter Jocelyn Lamer and son Donion.  We talked about nutrition and poor by mouth intake.  We talked about options such as continuing with current restricted diet.  Mrs. Gillentine was on pured diet at her facility.  We also talked about nectar thick liquids and expectation of dehydration.  We talked about Mrs. Vantrease's chronic illness burden, her functional status and frailty.  Jocelyn Lamer shares that Mrs. Rapaport was almost bedbound at her facility.    We talk about how to make choices for loved ones including 1) keeping them at the center of decision-making, not what we want for them 2) are we doing something for them or to them/can we change what is happening and 3) the person Mrs. Nordell was 5 years ago, how would that mother tell them to care for her now.  We talk about frailty, and I share that Mrs. Armato is unlikely to be accepted back to ALF.  Family quickly agrees.  I shared that she would easily qualify for residential hospice, focusing on comfort and dignity, to let nature take its course.  We talk in detail about what is and is not provided in residential hospice including, but not limited to, no IV fluids, no antibiotics, medicines for comfort/seizure, food as tolerated.  I shared that as we are nearing end-of-life we see that people sleep more, eat and interact less.    Family states that they will talk about disposition to residential hospice as a family tonight.  PMT  to reach out to daughter, Shirlean Mylar, 7/6 after lunch.  We talked about CODE STATUS.  Family states they have elected to "treat the treatable, but allowing natural passing/DNR".  Orders adjusted.  Conference with attending, bedside nursing staff, transition of care team related to patient condition, needs, goals of care.  Plan:    At this point continue to treat the treatable but no CPR or intubation.  Family is considering residential hospice at [facility]. PMT to reach out to daughter Shirlean Mylar after lunch 7/6.  50 minutes  [name], NP Palliative medicine team Team phone [phone] Greater than 50% of this time was spent counseling and coordinating care related to the above assessment and plan.

## 2021-04-23 NOTE — Progress Notes (Signed)
PROGRESS NOTE    Rebecca Hardy  HKV:[id number] DOB: 1933/08/07 DOA: 04/14/2021 PCP: Bonnita Nasuti, MD    Chief Complaint  Patient presents with   Seizures    Brief admission Narrative:  Rebecca Hardy is a 87 y.o. female with medical history significant for hypertension, CVA with left-sided weakness, dementia, diabetes mellitus.  Patient was brought to the ED from facility reports of possible seizure activity, patient's temperature was 103 per nursing staff.  Patient was seizing for about 5 minutes prior to EMS arrival, 2.5 mg of Versed was given patient stopped seizing for about 1 and half  minute minutes, and then repeat dose of 2.5 mg of Versed was given. EDP talked to nursing home staff, reported at about 10:30 AM patient was okay, and about noon they went to feed her and patient was shaking but still able to talk, and was oriented x1 this is her baseline.  Does reported that this has happened in the past, and was due to hypoglycemia.  Her blood sugars have been elevated.  Also reports wounds to her feet which were x-rayed and negative for osteomyelitis. On my evaluation patient is awake, alert, verbalizing able to tell me her name but not answer subsequent questions.   ED Course: Febrile to 101.7.  Tachycardic 156, respiratory rate 12-22.  Blood pressure systolic 56-433.  O2 sats greater than 98% on room air.  Lactic acid 3 > 2.  WBC 8.4.  UA with large leukocytes.  Head CT without acute abnormality.  Port chest x-ray unremarkable.  500 mils normal saline bolus given in route to ED, 1500 mils sepsis fluid bolus given in ED.  EDP talked to cardiologist on-call- Dr. Doree Albee, he also initial concern that this was atrial flutter.  And recommendation was to give IV metoprolol and if not effective start IV amiodarone.  Assessment & Plan:  1)Severe Sepsis with septic shock due to Proteus Mirabilis UTI-- -Patient met severe sepsis criteria on exam secondary to UTI; high-grade temperature,  elevated heart rate, elevated lactic acid and acute kidney injury. -Urine Cx from 04/14/21  with Proteus Mirabilis  Blood Cx from 04/14/21 with PROPIONIBACTERIUM ACNES--suspect is a contaminant -COVID test negative -Treated with IV Rocephin 2 gm daily for Proteus mirabilis, de-escalated to Ancef-last dose of Ancef 04/22/2021 ---patient has now been weaned off Levophed with improved BP -Awaiting family decision on possible transition to comfort care and residential hospice at [facility]  2)New Onset Seizures----patient with history of prior strokes -EEG from 04/17/2021 noted -Continue Keppra and Vimpat--both were started on this admission -May use lorazepam as needed -Official neuro consult appreciated -Patient remains pretty sleepy/drowsy-with ongoing tics , shakes, tremors  3)-Atrial flutter with RVR -weaned off amiodarone iv drip  C/n metoprolol 50 mg bid -Cardiology consult appreciated -2D echo with EF of 55 to 60 % -No anticoagulation in the setting of advanced dementia and high risk for falls. -CHADSVASC score >5 -Continue Aspirin  4)History of stroke/TIA with Left-sided weakness -CT head shows old  right MCA and right PCA infarcts. -Continue aspirin and and lipitor -No additional new  Acute focal deficits appreciated. -Patient with significant metabolic encephalopathy, pretty sleepy/drowsy  5)-hypokalemia/Hypomagnesemia--- replace and recheck, please see #3 above  6)-Essential hypertension -Stable overall -Continue adjusted dose of metoprolol.  7)-acute kidney injury on CKD (chronic kidney disease) stage 3a, GFR 30-59 ml/min (HCC) -In the setting of dehydration and UTI -Minimize using nephrotoxic agents, hypotension and the use of contrast.  8-Type 2 diabetes mellitus with  hyperlipidemia (HCC) Use Novolog/Humalog Sliding scale insulin with Accu-Cheks/Fingersticks as ordered  9-Dementia ([place]) --Patient with significant metabolic encephalopathy, pretty  sleepy/drowsy  10)Gastroesophageal reflux disease/GI prophylaxis -Continue PPI.  11)Chronic Anemia-- Hgb is is close to baseline, no bleeding concerns  12) social/ethics--- palliative care consult appreciated, Pt has 5 kids (3 sons --Ginnie Smart, Marcelina Morel and Donion and 2 daughters ---Jocelyn Lamer and Robin) -Patient is DNR/DNI, -Awaiting family decision on possible transition to comfort care and residential hospice at [facility]  13)FEN/dysphagia--poor oral intake partially due to altered mentation in the setting of recurrent seizures and postictal state, IV fluids pending better tolerance of oral intake -Speech pathologist eval appreciated--recommends Dysphagia 1 (puree);Nectar-thick liquid---Patient with significant metabolic encephalopathy, pretty sleepy/drowsy--making oral intake not very safe at times -  14) acute metabolic encephalopathy--Patient with significant metabolic encephalopathy, pretty sleepy/drowsy--- please see above  DVT prophylaxis: Lovenox Code Status: Partial code Family Communication:-  palliative care provider spoke with daughters Paul Half at [phone] and Jocelyn Lamer [name] spoke with her ) -I updated Jocelyn Lamer on 04/21/2021-  I updated daughter Shirlean Mylar on 04/22/2021  Disposition:   Status is: Inpatient  Remains inpatient appropriate because: Sepsis and new onset seizures requiring IV antibiotics and IV Keppra  Dispo: The patient is from: [place]              Anticipated d/c is to: Discharge back to facility              Patient currently is not medically stable to d/c.   Difficult to place patient No -Awaiting family decision regarding goals of care to help determine disposition,  --Awaiting family decision on possible transition to comfort care and residential hospice at [facility]    Consultants:  Cardiology service Palliative care service Neurology  Procedures:  See below for x-ray report 2D echo:  Antimicrobials: Ceftriaxone last dose  04/22/21   Subjective:  -Her youngest son Donion visited 04/23/2021 he lives in [place] daughter Jocelyn Lamer also visited --Awaiting family decision on possible transition to comfort care and residential hospice at [facility]  Objective: Vitals:   04/23/21 1600 04/23/21 1652 04/23/21 1840 04/23/21 1841  BP:  97/83  (!) 120/48  Pulse:  68  66  Resp:  19 18   Temp:  99 F (37.2 C)    TempSrc:  Axillary    SpO2: 100% 100%  100%  Weight:      Height:        Intake/Output Summary (Last 24 hours) at 04/23/2021 1900 Last data filed at 04/23/2021 1800 Gross per 24 hour  Intake 400 ml  Output 526 ml  Net -126 ml   Filed Weights   04/14/21 1816 04/20/21 0321 04/21/21 0500  Weight: 61.6 kg 68.4 kg 68.6 kg   Examination:  Physical Exam Gen:-Overall very sleepy, she wakes up to answer simple questions  HEENT:- [place].AT, No sclera icterus Neck-Supple Neck,No JVD,.  Lungs-  mostly clear, fair air movement CV- S1, S2 normal, irregular Abd-  +ve B.Sounds, Abd Soft, No tenderness,    Extremity/Skin:- No  edema,   +pulses  Psych-baseline/underlying cognitive and memory deficits,  --Patient with significant metabolic encephalopathy, pretty sleepy/drowsy neuro-Lt UE weakness with contracture, no additional new focal deficits, has Tics/Tremors and shakes    Data Reviewed: I have personally reviewed following labs and imaging studies  CBC: Recent Labs  Lab 04/19/21 0429 04/21/21 0341 04/23/21 0601  WBC 7.9 9.0 9.1  HGB 10.0* 9.4* 8.8*  HCT 31.1* 29.4* 27.9*  MCV 88.6 89.1 89.4  PLT 270 246 973    Basic Metabolic Panel: Recent Labs  Lab 04/16/21 2019 04/17/21 2333 04/18/21 1518 04/19/21 0429 04/21/21 0341 04/23/21 0601  NA 135  --  139 135 136 136  K 4.3  --  3.3* 4.0 3.4* 3.5  CL 106  --  107 106 105 108  CO2 24  --  '23 22 24 23  ' GLUCOSE 118*  --  123* 186* 188* 272*  BUN 12  --  7* 5* 10 14  CREATININE 0.77  --  0.64 0.66 0.66 0.69  CALCIUM 7.9*  --   8.0* 8.0* 8.0* 7.8*  MG 1.8 1.7 1.9  --   --   --   PHOS  --   --  2.2*  --   --   --     GFR: Estimated Creatinine Clearance: 47.3 mL/min (by C-G formula based on SCr of 0.69 mg/dL).  Liver Function Tests: Recent Labs  Lab 04/16/21 2019 04/18/21 1518  AST 13*  --   ALT 9  --   ALKPHOS 52  --   BILITOT 0.4  --   PROT 5.0*  --   ALBUMIN 2.3* 2.6*    CBG: Recent Labs  Lab 04/22/21 1631 04/23/21 0829 04/23/21 1109 04/23/21 1618 04/23/21 1837  GLUCAP 180* 228* 187* 156* 147*     Recent Results (from the past 240 hour(s))  Urine culture     Status: Abnormal   Collection Time: 04/14/21  1:35 PM   Specimen: Urine, Catheterized  Result Value Ref Range Status   Specimen Description   Final    URINE, CATHETERIZED Performed at [facility], [address], [place], [place] [address]    Special Requests   Final    NONE Performed at [facility], [address], [place], [place] [address]    Culture >=100,000 COLONIES/mL PROTEUS MIRABILIS (A)  Final   Report Status 04/18/2021 FINAL  Final   Organism ID, Bacteria PROTEUS MIRABILIS (A)  Final      Susceptibility   Proteus mirabilis - MIC*    AMPICILLIN >=32 RESISTANT Resistant     CEFAZOLIN <=4 SENSITIVE Sensitive     CEFEPIME <=0.12 SENSITIVE Sensitive     CEFTRIAXONE <=0.25 SENSITIVE Sensitive     CIPROFLOXACIN >=4 RESISTANT Resistant     GENTAMICIN <=1 SENSITIVE Sensitive     IMIPENEM 1 SENSITIVE Sensitive     NITROFURANTOIN 128 RESISTANT Resistant     TRIMETH/SULFA <=20 SENSITIVE Sensitive     AMPICILLIN/SULBACTAM 4 SENSITIVE Sensitive     PIP/TAZO <=4 SENSITIVE Sensitive     * >=100,000 COLONIES/mL PROTEUS MIRABILIS  Blood Culture (routine x 2)     Status: None   Collection Time: 04/14/21  1:39 PM   Specimen: BLOOD LEFT HAND  Result Value Ref Range Status   Specimen Description   Final    BLOOD LEFT HAND BOTTLES DRAWN AEROBIC AND ANAEROBIC   Special Requests Blood Culture adequate volume  Final   Culture    Final    NO GROWTH 5 DAYS Performed at [facility], [address] [place], [place] [address]    Report Status 04/19/2021 FINAL  Final  Blood Culture (routine x 2)     Status: Abnormal   Collection Time: 04/14/21  1:39 PM   Specimen: BLOOD RIGHT FOREARM  Result Value Ref Range Status   Specimen Description   Final    BLOOD RIGHT FOREARM BOTTLES DRAWN AEROBIC AND ANAEROBIC Performed  at [facility], [address] [place], [place] [address]    Special Requests   Final    Blood Culture adequate volume Performed at [facility], [address] [place], [place] [address]    Culture  Setup Time   Final    GRAM POSITIVE RODS IN BOTH AEROBIC AND ANAEROBIC BOTTLES Gram Stain Report Called to,Read Back By and Verified With: HYLTON,L'[email or web address]'  BY MATTHEWS, B 6.28.22 [facility] RESULT PREV. CALLED    Culture (A)  Final    PROPIONIBACTERIUM ACNES Standardized susceptibility testing for this organism is not available. Performed at [facility], [place] [address], [place], [place] [address]    Report Status 04/18/2021 FINAL  Final  Resp Panel by RT-PCR (Flu A&B, Covid) Nasopharyngeal Swab     Status: None   Collection Time: 04/14/21  1:54 PM   Specimen: Nasopharyngeal Swab; Nasopharyngeal(NP) swabs in vial transport medium  Result Value Ref Range Status   SARS Coronavirus 2 by RT PCR NEGATIVE NEGATIVE Final    Comment: (NOTE) SARS-CoV-2 target nucleic acids are NOT DETECTED.  The SARS-CoV-2 RNA is generally detectable in upper respiratory specimens during the acute phase of infection. The lowest concentration of SARS-CoV-2 viral copies this assay can detect is 138 copies/mL. A negative result does not preclude SARS-Cov-2 infection and should not be used as the sole basis for treatment or other patient management decisions. A negative result may occur with  improper specimen collection/handling, submission of specimen other than nasopharyngeal swab, presence of viral mutation(s)  within the areas targeted by this assay, and inadequate number of viral copies(<138 copies/mL). A negative result must be combined with clinical observations, patient history, and epidemiological information. The expected result is Negative.  Fact Sheet for Patients:  EntrepreneurPulse.com.au  Fact Sheet for Healthcare Providers:  IncredibleEmployment.be  This test is no t yet approved or cleared by the [place] FDA and  has been authorized for detection and/or diagnosis of SARS-CoV-2 by FDA under an Emergency Use Authorization (EUA). This EUA will remain  in effect (meaning this test can be used) for the duration of the COVID-19 declaration under Section 564(b)(1) of the Act, 21 U.S.C.section 360bbb-3(b)(1), unless the authorization is terminated  or revoked sooner.       Influenza A by PCR NEGATIVE NEGATIVE Final   Influenza B by PCR NEGATIVE NEGATIVE Final    Comment: (NOTE) The Xpert Xpress SARS-CoV-2/FLU/RSV plus assay is intended as an aid in the diagnosis of influenza from Nasopharyngeal swab specimens and should not be used as a sole basis for treatment. Nasal washings and aspirates are unacceptable for Xpert Xpress SARS-CoV-2/FLU/RSV testing.  Fact Sheet for Patients: EntrepreneurPulse.com.au  Fact Sheet for Healthcare Providers: IncredibleEmployment.be  This test is not yet approved or cleared by the [place] FDA and has been authorized for detection and/or diagnosis of SARS-CoV-2 by FDA under an Emergency Use Authorization (EUA). This EUA will remain in effect (meaning this test can be used) for the duration of the COVID-19 declaration under Section 564(b)(1) of the Act, 21 U.S.C. section 360bbb-3(b)(1), unless the authorization is terminated or revoked.  Performed at [facility], [address] [place], [place] [address]   MRSA Next Gen by PCR, Nasal     Status: None    Collection Time: 04/14/21  6:15 PM   Specimen: Nasal Mucosa; Nasal Swab  Result Value Ref Range Status   MRSA by PCR Next Gen NOT DETECTED NOT DETECTED Final    Comment: (NOTE) The GeneXpert  MRSA Assay (FDA approved for NASAL specimens only), is one component of a comprehensive MRSA colonization surveillance program. It is not intended to diagnose MRSA infection nor to guide or monitor treatment for MRSA infections. Test performance is not FDA approved in patients less than 53 years old. Performed at [facility], [address] [place], [place] [address]      Radiology Studies: No results found.   Scheduled Meds:  (feeding supplement) PROSource Plus  30 mL Oral TID BM   atorvastatin  20 mg Oral Daily   chlorhexidine  15 mL Mouth Rinse BID   Chlorhexidine Gluconate Cloth  6 each Topical Daily   enoxaparin (LOVENOX) injection  40 mg Subcutaneous Q24H   feeding supplement  237 mL Oral BID BM   insulin aspart  0-5 Units Subcutaneous QHS   insulin aspart  0-9 Units Subcutaneous TID WC   mouth rinse  15 mL Mouth Rinse q12n4p   metoprolol tartrate  50 mg Oral BID   multivitamin with minerals  1 tablet Oral Daily   pantoprazole  40 mg Oral Daily   vitamin B-12  500 mcg Oral Daily   Continuous Infusions:  sodium chloride 250 mL (04/22/21 1946)   lacosamide (VIMPAT) IV 50 mg (04/23/21 0842)   levETIRAcetam 1,000 mg (04/23/21 0825)    LOS: 9 days   Roxan Hockey, MD [facility]  To contact the attending provider between 7A-7P or the covering provider during after hours 7P-7A, please log into the web site [email or web address] and access using universal [place] password for that web site. If you do not have the password, please call the hospital operator.  04/23/2021, 7:00 PM   ?'

## 2021-04-24 DIAGNOSIS — Z515 Encounter for palliative care: Secondary | ICD-10-CM

## 2021-04-24 LAB — GLUCOSE, CAPILLARY: Glucose-Capillary: 132 mg/dL — ABNORMAL HIGH (ref 70–99)

## 2021-04-24 LAB — RESP PANEL BY RT-PCR (FLU A&B, COVID) ARPGX2
Influenza A by PCR: NEGATIVE
Influenza B by PCR: NEGATIVE
SARS Coronavirus 2 by RT PCR: NEGATIVE

## 2021-04-24 MED ORDER — MORPHINE SULFATE (PF) 2 MG/ML IV SOLN: 2.0000 mg | INTRAVENOUS | Status: DC

## 2021-04-24 MED ORDER — GLYCOPYRROLATE 0.2 MG/ML IJ SOLN: 0.2000 mg | INTRAMUSCULAR | Status: DC | PRN

## 2021-04-24 MED ORDER — MORPHINE SULFATE (PF) 2 MG/ML IV SOLN
INTRAVENOUS | Status: AC
  Filled 2021-04-24: qty 1

## 2021-04-24 MED ORDER — HALOPERIDOL LACTATE 2 MG/ML PO CONC
0.5000 mg | ORAL | Status: DC | PRN
  Filled 2021-04-24: qty 0.3

## 2021-04-24 MED ORDER — GLYCOPYRROLATE 1 MG PO TABS: 1.0000 mg | ORAL_TABLET | ORAL | Status: DC | PRN

## 2021-04-24 MED ORDER — BIOTENE DRY MOUTH MT LIQD: 15.0000 mL | OROMUCOSAL | Status: DC | PRN

## 2021-04-24 MED ORDER — POLYVINYL ALCOHOL 1.4 % OP SOLN: 1.0000 [drp] | Freq: Four times a day (QID) | OPHTHALMIC | Status: DC | PRN

## 2021-04-24 MED ORDER — MORPHINE SULFATE (PF) 2 MG/ML IV SOLN
2.0000 mg | INTRAVENOUS | Status: DC | PRN
  Administered 2021-04-24: 2 mg via INTRAVENOUS

## 2021-04-24 MED ORDER — METOPROLOL TARTRATE 50 MG PO TABS: 50.0000 mg | ORAL_TABLET | Freq: Two times a day (BID) | ORAL | Status: AC

## 2021-04-24 MED ORDER — HALOPERIDOL LACTATE 5 MG/ML IJ SOLN: 0.5000 mg | INTRAMUSCULAR | Status: DC | PRN

## 2021-04-24 MED ORDER — POLYETHYLENE GLYCOL 3350 17 G PO PACK: 17.0000 g | PACK | Freq: Every day | ORAL | 0 refills | Status: AC | PRN

## 2021-04-24 MED ORDER — LORAZEPAM 2 MG/ML IJ SOLN: 1.0000 mg | INTRAMUSCULAR | 0 refills | Status: AC | PRN

## 2021-04-24 MED ORDER — LACOSAMIDE 50 MG PO TABS: 50.0000 mg | ORAL_TABLET | Freq: Two times a day (BID) | ORAL | Status: AC

## 2021-04-24 MED ORDER — LORAZEPAM 1 MG PO TABS: 1.0000 mg | ORAL_TABLET | ORAL | 0 refills | Status: AC | PRN

## 2021-04-24 MED ORDER — LEVETIRACETAM 500 MG PO TABS: 1000.0000 mg | ORAL_TABLET | Freq: Two times a day (BID) | ORAL | Status: AC

## 2021-04-24 MED ORDER — MORPHINE SULFATE (CONCENTRATE) 10 MG/0.5ML PO SOLN: 2.6000 mg | ORAL | 0 refills | Status: AC | PRN

## 2021-04-24 MED ORDER — MORPHINE SULFATE (CONCENTRATE) 10 MG/0.5ML PO SOLN
2.6000 mg | ORAL | Status: DC | PRN
Start: 2021-04-24 — End: 2021-04-24

## 2021-04-24 MED ORDER — HALOPERIDOL 0.5 MG PO TABS: 0.5000 mg | ORAL_TABLET | ORAL | Status: DC | PRN

## 2021-04-24 NOTE — Discharge Summary (Signed)
Physician Discharge Summary  Rebecca Hardy FAO:[id number] DOB: 29-Jun-1933 DOA: 04/14/2021  PCP: Bonnita Nasuti, MD  Admit date: 04/14/2021 Discharge date: 04/24/2021  Admitted From: [facility] Disposition:  Residential hospice   Recommendations for Outpatient Follow-up:  Patient is being discharged to residential hospice for end-of-life care  Discharge Condition: Hospice, terminal CODE STATUS: DNR, comfort measures Diet recommendation: Dysphagia 1 diet with nectar thick liquids  Brief/Interim Summary: 87 year old female with a history of hypertension, prior CVA with residual left-sided weakness, dementia, diabetes, was admitted to the hospital with fever.  She had been seizing for approximately 5 minutes prior to arrival.  She was noted to be febrile in the emergency room, tachycardic and tachypneic.  She had elevated lactic acid.  Urinalysis indicated UTI.  She was noted to be in atrial flutter.  She was started on IV amiodarone.  She was also started on IV antibiotics for sepsis.  She was admitted for further treatments.  Discharge Diagnoses:  Principal Problem:   Sepsis ([place]) Active Problems:   Left-sided weakness   Essential hypertension   CKD (chronic kidney disease) stage 3, GFR 30-59 ml/min (HCC)   Type 2 diabetes mellitus with hyperlipidemia (HCC)   Dementia (HCC)   Seizures (HCC)   Proteus mirabilis infection/UTI   Hospice care patient  Severe sepsis with septic shock due to Proteus mirabilis UTI -Urine culture positive for Proteus mirabilis -Blood culture with Propionibacterium acnes (this was felt to be a contaminant) -Patient was treated with intravenous antibiotics -She has been afebrile and sepsis physiology has resolved  New onset seizures -Seen by neurology -Started on Keppra and Vimpat -Substrate for seizures with likely prior stroke in the setting of infection  Atrial flutter with rapid ventricular response -Started on amiodarone and  subsequently transitioned to metoprolol twice daily -No anticoagulation due to advanced dementia, high fall risk and poor long-term prognosis  Dementia -Patient with persistent metabolic encephalopathy, continues to be sleepy, p.o. intake has been poor  Goals of care -Due to her persistent encephalopathy, poor p.o. intake, general decline, long-term prognosis remains poor -She was seen by palliative care and transition to DNR status -After further conversations, family has elected to transition to comfort care at residential hospice -Considering her overall decline, this appears to be reasonable   Discharge Instructions  Discharge Instructions     Diet - low sodium heart healthy   Complete by: As directed    Increase activity slowly   Complete by: As directed    No wound care   Complete by: As directed       Allergies as of 04/24/2021       Reactions   Codeine Other (See Comments)   Unknown reaction   Penicillins Other (See Comments)   Unknown reaction. Tolerates Ancef Has patient had a PCN reaction causing immediate rash, facial/tongue/throat swelling, SOB or lightheadedness with hypotension: Uknown Has patient had a PCN reaction causing severe rash involving mucus membranes or skin necrosis: Uknown Has patient had a PCN reaction that required hospitalization: Unknown Has patient had a PCN reaction occurring within the last 10 years: No If all of the above answers are "NO", then may proceed with Cephalosporin use.   Sulfa Antibiotics         Medication List     STOP taking these medications    aspirin 325 MG tablet   furosemide 40 MG tablet Commonly known as: LASIX   linagliptin 5 MG Tabs tablet Commonly known as: TRADJENTA  loratadine 10 MG tablet Commonly known as: CLARITIN   metoprolol succinate 25 MG 24 hr tablet Commonly known as: TOPROL-XL   rosuvastatin 20 MG tablet Commonly known as: CRESTOR   sertraline 25 MG tablet Commonly known as:  ZOLOFT   tiZANidine 2 MG tablet Commonly known as: ZANAFLEX   Toujeo SoloStar 300 UNIT/ML Solostar Pen Generic drug: insulin glargine (1 Unit Dial)   Vitamin D2 50 MCG (2000 UT) Tabs       TAKE these medications    lacosamide 50 MG Tabs tablet Commonly known as: Vimpat Take 1 tablet (50 mg total) by mouth 2 (two) times daily.   levETIRAcetam 500 MG tablet Commonly known as: Keppra Take 2 tablets (1,000 mg total) by mouth 2 (two) times daily.   LORazepam 1 MG tablet Commonly known as: Ativan Place 1 tablet (1 mg total) under the tongue every 4 (four) hours as needed for anxiety or seizure. What changed:  medication strength how much to take how to take this when to take this reasons to take this   LORazepam 2 MG/ML injection Commonly known as: ATIVAN Inject 0.5 mLs (1 mg total) into the muscle every 4 (four) hours as needed for seizure. What changed: You were already taking a medication with the same name, and this prescription was added. Make sure you understand how and when to take each.   metoprolol tartrate 50 MG tablet Commonly known as: LOPRESSOR Take 1 tablet (50 mg total) by mouth 2 (two) times daily.   morphine CONCENTRATE 10 MG/0.5ML Soln concentrated solution Place 0.13-0.25 mLs (2.6-5 mg total) under the tongue every 2 (two) hours as needed for severe pain or moderate pain.   polyethylene glycol 17 g packet Commonly known as: MIRALAX / GLYCOLAX Take 17 g by mouth daily as needed for mild constipation.        Contact information for after-discharge care     [place] .   Service: Inpatient Hospice Contact information: [address] [address] [phone]                    Allergies  Allergen Reactions   Codeine Other (See Comments)    Unknown reaction   Penicillins Other (See Comments)    Unknown reaction. Tolerates Ancef  Has patient had a PCN reaction causing  immediate rash, facial/tongue/throat swelling, SOB or lightheadedness with hypotension: Uknown Has patient had a PCN reaction causing severe rash involving mucus membranes or skin necrosis: Uknown Has patient had a PCN reaction that required hospitalization: Unknown Has patient had a PCN reaction occurring within the last 10 years: No If all of the above answers are "NO", then may proceed with Cephalosporin use.   Sulfa Antibiotics     Consultations: Neurology Palliative care   Procedures/Studies: CT HEAD WO CONTRAST  Result Date: 04/17/2021 CLINICAL DATA:  Seizure EXAM: CT HEAD WITHOUT CONTRAST TECHNIQUE: Contiguous axial images were obtained from the base of the skull through the vertex without intravenous contrast. COMPARISON:  None. 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 Old left posterior parietal infarct. No acute hemorrhage. There is cortical laminar necrosis at the posterior right parietal lobe, unchanged. There are multiple old small vessel infarcts of the cerebellum. Vascular: No abnormal hyperdensity of the major intracranial arteries or dural venous sinuses. No intracranial atherosclerosis. Skull: The visualized skull base, calvarium and extracranial soft tissues are normal. Sinuses/Orbits: No fluid levels or advanced mucosal  thickening of the visualized paranasal sinuses. No mastoid or middle ear effusion. The orbits are normal. IMPRESSION: 1. No acute intracranial abnormality. 2. Old right MCA and PCA territory infarcts and multiple old small vessel infarcts of the cerebellum. Electronically Signed   By: Ulyses Jarred M.D.   On: 04/17/2021 01:06   CT Head Wo Contrast  Result Date: 04/14/2021 CLINICAL DATA:  Delirium.  New onset seizure.  Fever. EXAM: CT HEAD WITHOUT CONTRAST TECHNIQUE: Contiguous axial images were obtained from the base of the skull through the vertex without intravenous contrast. COMPARISON:  10/03/2018 FINDINGS: Brain: The right MCA infarct on  the prior CT has evolved, now with a large area of encephalomalacia in the temporal and parietal lobes with associated cortical mineralization and ex vacuo dilatation of the temporal horn of the right lateral ventricle. Small chronic left parietal and left temporal occipital infarcts are unchanged. There are new chronic infarcts involving the right frontal operculum (MCA territory) and right occipital and medial temporal lobes (PCA territory). Chronic lacunar infarcts are again noted in the deep cerebral white matter and cerebellum. No definite acute infarct, intracranial hemorrhage, mass, midline shift, or extra-axial fluid collection is identified. Vascular: Calcified atherosclerosis at the skull base. No hyperdense vessel. Skull: No fracture or suspicious osseous lesion. Sinuses/Orbits: Mild mucosal thickening in the paranasal sinuses. Small volume fluid in the left sphenoid sinus. Clear mastoid air cells. Bilateral cataract extraction. Other: None. IMPRESSION: 1. No evidence of acute intracranial abnormality. 2. Numerous chronic infarcts as above including interval right MCA and right PCA infarcts. Electronically Signed   By: Logan Bores M.D.   On: 04/14/2021 14:39   DG Chest Port 1 View  Result Date: 04/14/2021 CLINICAL DATA:  Questionable sepsis. EXAM: PORTABLE CHEST 1 VIEW COMPARISON:  October 03, 2018 FINDINGS: The heart size and mediastinal contours are within normal limits. Both lungs are clear. The visualized skeletal structures are unremarkable. IMPRESSION: No active disease. Electronically Signed   By: Dorise Bullion III M.D   On: 04/14/2021 13:48   EEG adult  Result Date: 04/17/2021 Lora Havens, MD     04/17/2021  5:23 PM Patient Name: ADDILEE NEU MRN: [id number] Epilepsy Attending: Lora Havens Referring Physician/Provider: Dr Roxan Hockey Date: 04/17/2021 Duration: 23.31 mins Patient history: 87yo F with h/o R MCA stroke, noted to have left face twitching. EEG to evaluate  for seizure Level of alertness:  lethargic AEDs during EEG study: LEV Technical aspects: This EEG study was done with scalp electrodes positioned according to the 10-20 International system of electrode placement. Electrical activity was acquired at a sampling rate of 500Hz  and reviewed with a high frequency filter of 70Hz  and a low frequency filter of 1Hz . EEG data were recorded continuously and digitally stored. Description: No posterior dominant rhythm was seen. EEG showed continuous generalized 3 to 6 Hz theta-delta slowing. Patient was noted to have frequent episodes of chewing like movements. At times, there was a subtle head turn to right prior to this chewing movements.Concomitant eeg showed significant movement artifact in right hemisphere. Hyperventilation and photic stimulation were not performed.   IMPRESSION: This study  is suggestive of moderate diffuse encephalopathy, nonspecific etiology. Multiple episodes of chewing like movements which at times was preceded by subtle head turn to right were recorded. Concomitant eeg was difficult to interpret due to significant movement artifact. However, focal motor seizures may not be seen on scalp eeg. Additionally, given h/o underlying stroke, semiology of episodes is concerning for seizures. Clinical  correlation is recommended. Dr Denton Brick was notified. Lora Havens   ECHOCARDIOGRAM COMPLETE  Result Date: 04/15/2021    ECHOCARDIOGRAM REPORT   Patient Name:   KYNSLEIGH WESTENDORF Date of Exam: 04/15/2021 Medical Rec #:  [id number]        Height:       66.0 in Accession #:    [id number]       Weight:       135.8 lb Date of Birth:  03-Aug-1933         BSA:          1.696 m Patient Age:    32 years         BP:           127/37 mmHg Patient Gender: F                HR:           67 bpm. Exam Location:  [facility] Procedure: 2D Echo, Cardiac Doppler and Color Doppler Indications:    Atrial Flutter I48.92  History:        Patient has prior history of Echocardiogram  examinations, most                 recent 08/19/2018. Stroke, Arrythmias:Atrial Flutter; Risk                 Factors:Hypertension, Diabetes and Dyslipidemia. COVID +                 11/20/2020.  Sonographer:    Wenda Low Referring Phys: P[place]  1. Left ventricular ejection fraction, by estimation, is 55 to 60%. The left ventricle has normal function. The left ventricle has no regional wall motion abnormalities. Left ventricular diastolic parameters were normal.  2. Right ventricular systolic function is normal. The right ventricular size is normal. There is normal pulmonary artery systolic pressure. The estimated right ventricular systolic pressure is 06.3 mmHg.  3. The mitral valve is abnormal. Mild mitral valve regurgitation. Moderate mitral annular calcification.  4. The aortic valve is tricuspid. Aortic valve regurgitation is mild. Mild to moderate aortic valve sclerosis/calcification is present, without any evidence of aortic stenosis. Aortic regurgitation PHT measures 589 msec. Aortic valve mean gradient measures 3.0 mmHg.  5. The inferior vena cava is normal in size with greater than 50% respiratory variability, suggesting right atrial pressure of 3 mmHg. FINDINGS  Left Ventricle: Left ventricular ejection fraction, by estimation, is 55 to 60%. The left ventricle has normal function. The left ventricle has no regional wall motion abnormalities. The left ventricular internal cavity size was normal in size. There is  no left ventricular hypertrophy. Left ventricular diastolic parameters were normal. Right Ventricle: The right ventricular size is normal. No increase in right ventricular wall thickness. Right ventricular systolic function is normal. There is normal pulmonary artery systolic pressure. The tricuspid regurgitant velocity is 2.38 m/s, and  with an assumed right atrial pressure of 3 mmHg, the estimated right ventricular systolic pressure is 01.6 mmHg. Left  Atrium: Left atrial size was normal in size. Right Atrium: Right atrial size was normal in size. Pericardium: There is no evidence of pericardial effusion. Mitral Valve: The mitral valve is abnormal. Moderate mitral annular calcification. Mild mitral valve regurgitation. MV peak gradient, 6.6 mmHg. The mean mitral valve gradient is 3.0 mmHg. Tricuspid Valve: The tricuspid valve is grossly normal. Tricuspid valve regurgitation is trivial. Aortic Valve: The aortic valve is tricuspid. There is mild  to moderate aortic valve annular calcification. Aortic valve regurgitation is mild. Aortic regurgitation PHT measures 589 msec. Mild to moderate aortic valve sclerosis/calcification is present, without any evidence of aortic stenosis. Aortic valve mean gradient measures 3.0 mmHg. Aortic valve peak gradient measures 6.6 mmHg. Aortic valve area, by VTI measures 2.11 cm. Pulmonic Valve: The pulmonic valve was grossly normal. Pulmonic valve regurgitation is trivial. Aorta: The aortic root is normal in size and structure. Venous: The inferior vena cava is normal in size with greater than 50% respiratory variability, suggesting right atrial pressure of 3 mmHg. IAS/Shunts: No atrial level shunt detected by color flow Doppler.  LEFT VENTRICLE PLAX 2D LVIDd:         4.08 cm  Diastology LVIDs:         2.89 cm  LV e' medial:    9.81 cm/s LV PW:         0.96 cm  LV E/e' medial:  11.4 LV IVS:        0.93 cm  LV e' lateral:   11.40 cm/s LVOT diam:     2.00 cm  LV E/e' lateral: 9.8 LV SV:         71 LV SV Index:   42 LVOT Area:     3.14 cm  RIGHT VENTRICLE RV Basal diam:  3.25 cm RV Mid diam:    2.59 cm RV S prime:     13.20 cm/s TAPSE (M-mode): 2.6 cm LEFT ATRIUM             Index       RIGHT ATRIUM          Index LA diam:        3.40 cm 2.00 cm/m  RA Area:     9.94 cm LA Vol (A2C):   38.8 ml 22.87 ml/m RA Volume:   18.80 ml 11.08 ml/m LA Vol (A4C):   38.1 ml 22.46 ml/m LA Biplane Vol: 38.4 ml 22.64 ml/m  AORTIC VALVE AV Area  (Vmax):    2.05 cm AV Area (Vmean):   2.01 cm AV Area (VTI):     2.11 cm AV Vmax:           128.00 cm/s AV Vmean:          86.100 cm/s AV VTI:            0.336 m AV Peak Grad:      6.6 mmHg AV Mean Grad:      3.0 mmHg LVOT Vmax:         83.60 cm/s LVOT Vmean:        55.000 cm/s LVOT VTI:          0.226 m LVOT/AV VTI ratio: 0.67 AI PHT:            589 msec  AORTA Ao Root diam: 3.00 cm Ao Asc diam:  2.90 cm MITRAL VALVE                TRICUSPID VALVE MV Area (PHT): 2.95 cm     TR Peak grad:   22.7 mmHg MV Area VTI:   1.86 cm     TR Vmax:        238.00 cm/s MV Peak grad:  6.6 mmHg MV Mean grad:  3.0 mmHg     SHUNTS MV Vmax:       1.28 m/s     Systemic VTI:  0.23 m MV Vmean:      82.3 cm/s  Systemic Diam: 2.00 cm MV Decel Time: 257 msec MV E velocity: 112.00 cm/s MV A velocity: 115.00 cm/s MV E/A ratio:  0.97 Rozann Lesches MD Electronically signed by Rozann Lesches MD Signature Date/Time: 04/15/2021/10:41:34 AM    Final       Subjective: Patient complains of some pain in her feet, she does briefly wake up to voice  Discharge Exam: Vitals:   04/24/21 0721 04/24/21 0800 04/24/21 0900 04/24/21 1040  BP:  (!) 130/115 (!) 120/92 (!) 159/60  Pulse: 63 65 66 71  Resp: (!) 23 (!) 22 19 20   Temp: (!) 97.1 F (36.2 C)     TempSrc: Oral     SpO2: 100% 100% 100% 100%  Weight:      Height:        General: Pt is alert, awake, not in acute distress Cardiovascular: RRR, S1/S2 +, no rubs, no gallops Respiratory: CTA bilaterally, no wheezing, no rhonchi Abdominal: Soft, NT, ND, bowel sounds + Extremities: no edema, no cyanosis    The results of significant diagnostics from this hospitalization (including imaging, microbiology, ancillary and laboratory) are listed below for reference.     Microbiology: Recent Results (from the past 240 hour(s))  Urine culture     Status: Abnormal   Collection Time: 04/14/21  1:35 PM   Specimen: Urine, Catheterized  Result Value Ref Range Status   Specimen  Description   Final    URINE, CATHETERIZED Performed at [facility], [address] [place], [place] [address]    Special Requests   Final    NONE Performed at [facility], [address] [place], [place] [address]    Culture >=100,000 COLONIES/mL PROTEUS MIRABILIS (A)  Final   Report Status 04/18/2021 FINAL  Final   Organism ID, Bacteria PROTEUS MIRABILIS (A)  Final      Susceptibility   Proteus mirabilis - MIC*    AMPICILLIN >=32 RESISTANT Resistant     CEFAZOLIN <=4 SENSITIVE Sensitive     CEFEPIME <=0.12 SENSITIVE Sensitive     CEFTRIAXONE <=0.25 SENSITIVE Sensitive     CIPROFLOXACIN >=4 RESISTANT Resistant     GENTAMICIN <=1 SENSITIVE Sensitive     IMIPENEM 1 SENSITIVE Sensitive     NITROFURANTOIN 128 RESISTANT Resistant     TRIMETH/SULFA <=20 SENSITIVE Sensitive     AMPICILLIN/SULBACTAM 4 SENSITIVE Sensitive     PIP/TAZO <=4 SENSITIVE Sensitive     * >=100,000 COLONIES/mL PROTEUS MIRABILIS  Blood Culture (routine x 2)     Status: None   Collection Time: 04/14/21  1:39 PM   Specimen: BLOOD LEFT HAND  Result Value Ref Range Status   Specimen Description   Final    BLOOD LEFT HAND BOTTLES DRAWN AEROBIC AND ANAEROBIC   Special Requests Blood Culture adequate volume  Final   Culture   Final    NO GROWTH 5 DAYS Performed at [facility], [address], [place], [place] [address]    Report Status 04/19/2021 FINAL  Final  Blood Culture (routine x 2)     Status: Abnormal   Collection Time: 04/14/21  1:39 PM   Specimen: BLOOD RIGHT FOREARM  Result Value Ref Range Status   Specimen Description   Final    BLOOD RIGHT FOREARM BOTTLES DRAWN AEROBIC AND ANAEROBIC Performed at [facility], [address], [place], [place] [address]    Special Requests   Final    Blood Culture adequate volume Performed at [facility], [address], [place], [place] [address]  Culture  Setup Time   Final    GRAM POSITIVE RODS IN BOTH AEROBIC AND ANAEROBIC BOTTLES Gram Stain Report Called  to,Read Back By and Verified With: HYLTON,[email or web address]  BY MATTHEWS, B 6.28.22  HOSP RESULT PREV. CALLED    Culture (A)  Final    PROPIONIBACTERIUM ACNES Standardized susceptibility testing for this organism is not available. Performed at [facility], [place] [address], [place], [place] [address]    Report Status 04/18/2021 FINAL  Final  Resp Panel by RT-PCR (Flu A&B, Covid) Nasopharyngeal Swab     Status: None   Collection Time: 04/14/21  1:54 PM   Specimen: Nasopharyngeal Swab; Nasopharyngeal(NP) swabs in vial transport medium  Result Value Ref Range Status   SARS Coronavirus 2 by RT PCR NEGATIVE NEGATIVE Final    Comment: (NOTE) SARS-CoV-2 target nucleic acids are NOT DETECTED.  The SARS-CoV-2 RNA is generally detectable in upper respiratory specimens during the acute phase of infection. The lowest concentration of SARS-CoV-2 viral copies this assay can detect is 138 copies/mL. A negative result does not preclude SARS-Cov-2 infection and should not be used as the sole basis for treatment or other patient management decisions. A negative result may occur with  improper specimen collection/handling, submission of specimen other than nasopharyngeal swab, presence of viral mutation(s) within the areas targeted by this assay, and inadequate number of viral copies(<138 copies/mL). A negative result must be combined with clinical observations, patient history, and epidemiological information. The expected result is Negative.  Fact Sheet for Patients:  EntrepreneurPulse.com.au  Fact Sheet for Healthcare Providers:  IncredibleEmployment.be  This test is no t yet approved or cleared by the [place] FDA and  has been authorized for detection and/or diagnosis of SARS-CoV-2 by FDA under an Emergency Use Authorization (EUA). This EUA will remain  in effect (meaning this test can be used) for the duration of the COVID-19 declaration under  Section 564(b)(1) of the Act, 21 U.S.C.section 360bbb-3(b)(1), unless the authorization is terminated  or revoked sooner.       Influenza A by PCR NEGATIVE NEGATIVE Final   Influenza B by PCR NEGATIVE NEGATIVE Final    Comment: (NOTE) The Xpert Xpress SARS-CoV-2/FLU/RSV plus assay is intended as an aid in the diagnosis of influenza from Nasopharyngeal swab specimens and should not be used as a sole basis for treatment. Nasal washings and aspirates are unacceptable for Xpert Xpress SARS-CoV-2/FLU/RSV testing.  Fact Sheet for Patients: EntrepreneurPulse.com.au  Fact Sheet for Healthcare Providers: IncredibleEmployment.be  This test is not yet approved or cleared by the [place] FDA and has been authorized for detection and/or diagnosis of SARS-CoV-2 by FDA under an Emergency Use Authorization (EUA). This EUA will remain in effect (meaning this test can be used) for the duration of the COVID-19 declaration under Section 564(b)(1) of the Act, 21 U.S.C. section 360bbb-3(b)(1), unless the authorization is terminated or revoked.  Performed at [facility], [address], [place],  [address]   MRSA Next Gen by PCR, Nasal     Status: None   Collection Time: 04/14/21  6:15 PM   Specimen: Nasal Mucosa; Nasal Swab  Result Value Ref Range Status   MRSA by PCR Next Gen NOT DETECTED NOT DETECTED Final    Comment: (NOTE) The GeneXpert MRSA Assay (FDA approved for NASAL specimens only), is one component of a comprehensive MRSA colonization surveillance program. It is not intended to diagnose MRSA infection nor to guide or monitor treatment for MRSA infections. Test performance is not  FDA approved in patients less than 11 years old. Performed at [facility], [address], [place], [place] [address]      Labs: BNP (last 3 results) No results for input(s): BNP in the last 8760 hours. Basic Metabolic Panel: Recent Labs  Lab 04/17/21 2333  04/18/21 1518 04/19/21 0429 04/21/21 0341 04/23/21 0601  NA  --  139 135 136 136  K  --  3.3* 4.0 3.4* 3.5  CL  --  107 106 105 108  CO2  --  23 22 24 23   GLUCOSE  --  123* 186* 188* 272*  BUN  --  7* 5* 10 14  CREATININE  --  0.64 0.66 0.66 0.69  CALCIUM  --  8.0* 8.0* 8.0* 7.8*  MG 1.7 1.9  --   --   --   PHOS  --  2.2*  --   --   --    Liver Function Tests: Recent Labs  Lab 04/18/21 1518  ALBUMIN 2.6*   No results for input(s): LIPASE, AMYLASE in the last 168 hours. No results for input(s): AMMONIA in the last 168 hours. CBC: Recent Labs  Lab 04/19/21 0429 04/21/21 0341 04/23/21 0601  WBC 7.9 9.0 9.1  HGB 10.0* 9.4* 8.8*  HCT 31.1* 29.4* 27.9*  MCV 88.6 89.1 89.4  PLT 270 246 248   Cardiac Enzymes: No results for input(s): CKTOTAL, CKMB, CKMBINDEX, TROPONINI in the last 168 hours. BNP: Invalid input(s): POCBNP CBG: Recent Labs  Lab 04/23/21 0829 04/23/21 1109 04/23/21 1618 04/23/21 1837 04/24/21 0756  GLUCAP 228* 187* 156* 147* 132*   D-Dimer No results for input(s): DDIMER in the last 72 hours. Hgb A1c No results for input(s): HGBA1C in the last 72 hours. Lipid Profile No results for input(s): CHOL, HDL, LDLCALC, TRIG, CHOLHDL, LDLDIRECT in the last 72 hours. Thyroid function studies No results for input(s): TSH, T4TOTAL, T3FREE, THYROIDAB in the last 72 hours.  Invalid input(s): FREET3 Anemia work up No results for input(s): VITAMINB12, FOLATE, FERRITIN, TIBC, IRON, RETICCTPCT in the last 72 hours. Urinalysis    Component Value Date/Time   COLORURINE YELLOW 04/14/2021 1335   APPEARANCEUR TURBID (A) 04/14/2021 1335   LABSPEC 1.018 04/14/2021 1335   PHURINE 6.0 04/14/2021 1335   GLUCOSEU NEGATIVE 04/14/2021 1335   HGBUR NEGATIVE 04/14/2021 1335   BILIRUBINUR NEGATIVE 04/14/2021 1335   KETONESUR 5 (A) 04/14/2021 1335   PROTEINUR 100 (A) 04/14/2021 1335   UROBILINOGEN 0.2 04/29/2011 1847   NITRITE NEGATIVE 04/14/2021 1335   LEUKOCYTESUR  LARGE (A) 04/14/2021 1335   Sepsis Labs Invalid input(s): PROCALCITONIN,  WBC,  LACTICIDVEN Microbiology Recent Results (from the past 240 hour(s))  Urine culture     Status: Abnormal   Collection Time: 04/14/21  1:35 PM   Specimen: Urine, Catheterized  Result Value Ref Range Status   Specimen Description   Final    URINE, CATHETERIZED Performed at [facility], [address], [place], [place] [address]    Special Requests   Final    NONE Performed at [facility], [address], [place], [place] [address]    Culture >=100,000 COLONIES/mL PROTEUS MIRABILIS (A)  Final   Report Status 04/18/2021 FINAL  Final   Organism ID, Bacteria PROTEUS MIRABILIS (A)  Final      Susceptibility   Proteus mirabilis - MIC*    AMPICILLIN >=32 RESISTANT Resistant     CEFAZOLIN <=4 SENSITIVE Sensitive     CEFEPIME <=0.12 SENSITIVE Sensitive     CEFTRIAXONE <=  0.25 SENSITIVE Sensitive     CIPROFLOXACIN >=4 RESISTANT Resistant     GENTAMICIN <=1 SENSITIVE Sensitive     IMIPENEM 1 SENSITIVE Sensitive     NITROFURANTOIN 128 RESISTANT Resistant     TRIMETH/SULFA <=20 SENSITIVE Sensitive     AMPICILLIN/SULBACTAM 4 SENSITIVE Sensitive     PIP/TAZO <=4 SENSITIVE Sensitive     * >=100,000 COLONIES/mL PROTEUS MIRABILIS  Blood Culture (routine x 2)     Status: None   Collection Time: 04/14/21  1:39 PM   Specimen: BLOOD LEFT HAND  Result Value Ref Range Status   Specimen Description   Final    BLOOD LEFT HAND BOTTLES DRAWN AEROBIC AND ANAEROBIC   Special Requests Blood Culture adequate volume  Final   Culture   Final    NO GROWTH 5 DAYS Performed at [facility], [address], [place], [place] [address]    Report Status 04/19/2021 FINAL  Final  Blood Culture (routine x 2)     Status: Abnormal   Collection Time: 04/14/21  1:39 PM   Specimen: BLOOD RIGHT FOREARM  Result Value Ref Range Status   Specimen Description   Final    BLOOD RIGHT FOREARM BOTTLES DRAWN AEROBIC AND ANAEROBIC Performed at [facility], [address], [place], [place] [address]    Special Requests   Final    Blood Culture adequate volume Performed at [facility], [address] [place], [place] [address]    Culture  Setup Time   Final    GRAM POSITIVE RODS IN BOTH AEROBIC AND ANAEROBIC BOTTLES Gram Stain Report Called to,Read Back By and Verified With: HYLTON,[id number]  BY MATTHEWS, B 6.28.22 [facility] RESULT PREV. CALLED    Culture (A)  Final    PROPIONIBACTERIUM ACNES Standardized susceptibility testing for this organism is not available. Performed at [facility], [place] [address], [place], [place] [address]    Report Status 04/18/2021 FINAL  Final  Resp Panel by RT-PCR (Flu A&B, Covid) Nasopharyngeal Swab     Status: None   Collection Time: 04/14/21  1:54 PM   Specimen: Nasopharyngeal Swab; Nasopharyngeal(NP) swabs in vial transport medium  Result Value Ref Range Status   SARS Coronavirus 2 by RT PCR NEGATIVE NEGATIVE Final    Comment: (NOTE) SARS-CoV-2 target nucleic acids are NOT DETECTED.  The SARS-CoV-2 RNA is generally detectable in upper respiratory specimens during the acute phase of infection. The lowest concentration of SARS-CoV-2 viral copies this assay can detect is 138 copies/mL. A negative result does not preclude SARS-Cov-2 infection and should not be used as the sole basis for treatment or other patient management decisions. A negative result may occur with  improper specimen collection/handling, submission of specimen other than nasopharyngeal swab, presence of viral mutation(s) within the areas targeted by this assay, and inadequate number of viral copies(<138 copies/mL). A negative result must be combined with clinical observations, patient history, and epidemiological information. The expected result is Negative.  Fact Sheet for Patients:  EntrepreneurPulse.com.au  Fact Sheet for Healthcare Providers:   IncredibleEmployment.be  This test is no t yet approved or cleared by the [place] FDA and  has been authorized for detection and/or diagnosis of SARS-CoV-2 by FDA under an Emergency Use Authorization (EUA). This EUA will remain  in effect (meaning this test can be used) for the duration of the COVID-19 declaration under Section 564(b)(1) of the Act, 21 U.S.C.section 360bbb-3(b)(1), unless the authorization is terminated  or revoked sooner.  Influenza A by PCR NEGATIVE NEGATIVE Final   Influenza B by PCR NEGATIVE NEGATIVE Final    Comment: (NOTE) The Xpert Xpress SARS-CoV-2/FLU/RSV plus assay is intended as an aid in the diagnosis of influenza from Nasopharyngeal swab specimens and should not be used as a sole basis for treatment. Nasal washings and aspirates are unacceptable for Xpert Xpress SARS-CoV-2/FLU/RSV testing.  Fact Sheet for Patients: EntrepreneurPulse.com.au  Fact Sheet for Healthcare Providers: IncredibleEmployment.be  This test is not yet approved or cleared by the [place] FDA and has been authorized for detection and/or diagnosis of SARS-CoV-2 by FDA under an Emergency Use Authorization (EUA). This EUA will remain in effect (meaning this test can be used) for the duration of the COVID-19 declaration under Section 564(b)(1) of the Act, 21 U.S.C. section 360bbb-3(b)(1), unless the authorization is terminated or revoked.  Performed at [facility], [address] [place], [place] [address]   MRSA Next Gen by PCR, Nasal     Status: None   Collection Time: 04/14/21  6:15 PM   Specimen: Nasal Mucosa; Nasal Swab  Result Value Ref Range Status   MRSA by PCR Next Gen NOT DETECTED NOT DETECTED Final    Comment: (NOTE) The GeneXpert MRSA Assay (FDA approved for NASAL specimens only), is one component of a comprehensive MRSA colonization surveillance program. It is not intended to diagnose MRSA  infection nor to guide or monitor treatment for MRSA infections. Test performance is not FDA approved in patients less than 40 years old. Performed at [facility], [address], [place], [place] [address]      Time coordinating discharge: 47mins  SIGNED:   Kathie Dike, MD  [facility] 04/24/2021, 11:53 AM   If 7PM-7AM, please contact night-coverage [email or web address]

## 2021-04-24 NOTE — Progress Notes (Signed)
Palliative: Rebecca Hardy continues to be acutely ill and frail.  Family has elected comfort and dignity at end-of-life, residential hospice at [facility] in [place].  Orders adjusted to reflect comfort care.  DNR on chart.  Conference with attending, bedside nursing staff, transition of care team related to patient condition, needs, goals of care, disposition.  Plan: Requesting comfort and dignity at end-of-life, residential hospice at [facility] in [place].  End-of-life order set implemented  25 minutes [name], NP Palliative medicine team Team phone [phone] Greater than 50% of this time was spent counseling and coordinating care related to the above assessment and plan.

## 2021-04-24 NOTE — Care Management Important Message (Signed)
Important Message  Patient Details  Name: Rebecca Hardy MRN: [id number] Date of Birth: 03/02/1933   Medicare Important Message Given:  Yes     [name] 04/24/2021, 1:42 PM

## 2021-04-24 NOTE — Progress Notes (Signed)
Report called and given to RN on hospice house Rebecca Hardy, ems also received report, pt given x1 pain meds before transfer. RUA IV in place per facility request. D/C packet given to EMS with DNR FORM.

## 2021-04-24 NOTE — TOC Transition Note (Signed)
Transition of Care [facility]) - CM/SW Discharge Note   Patient Details  Name: Rebecca Hardy MRN: [id number] Date of Birth: [birth year]  [facility] CM/SW Contact:  Ihor Gully, LCSW Phone Number: 04/24/2021, 3:22 PM   Clinical Narrative:    TOC signing off.    Final next level of care: [place] Barriers to Discharge: No Barriers Identified   Patient Goals and CMS Choice Patient states their goals for this hospitalization and ongoing recovery are:: Residential hospice CMS Medicare.gov Compare Post Acute Care list provided to:: Patient Choice offered to / list presented to : Patient  Discharge Placement                Patient to be transferred to facility by: [facility] Name of family member notified: [name], dtr Patient and family notified of of transfer: 04/24/21  Discharge Plan and Services                                     Social Determinants of Health (SDOH) Interventions     Readmission Risk Interventions No flowsheet data found.

## 2021-04-24 NOTE — Discharge Instructions (Signed)
Report given to Atha Starks, RN in hospice house

## 2021-05-07 DIAGNOSIS — E038 Other specified hypothyroidism: Secondary | ICD-10-CM | POA: Diagnosis not present

## 2021-05-07 DIAGNOSIS — E559 Vitamin D deficiency, unspecified: Secondary | ICD-10-CM | POA: Diagnosis not present

## 2021-05-07 DIAGNOSIS — N189 Chronic kidney disease, unspecified: Secondary | ICD-10-CM | POA: Diagnosis not present

## 2021-05-07 DIAGNOSIS — E119 Type 2 diabetes mellitus without complications: Secondary | ICD-10-CM | POA: Diagnosis not present

## 2021-05-07 DIAGNOSIS — I10 Essential (primary) hypertension: Secondary | ICD-10-CM | POA: Diagnosis not present

## 2021-05-07 DIAGNOSIS — D518 Other vitamin B12 deficiency anemias: Secondary | ICD-10-CM | POA: Diagnosis not present

## 2021-05-07 DIAGNOSIS — F0390 Unspecified dementia without behavioral disturbance: Secondary | ICD-10-CM | POA: Diagnosis not present

## 2021-05-07 DIAGNOSIS — F329 Major depressive disorder, single episode, unspecified: Secondary | ICD-10-CM | POA: Diagnosis not present

## 2021-06-20 DEATH — deceased

## 2021-12-27 IMAGING — DX DG CHEST 1V PORT
1 series · 1 of 1 positions shown · non-contrast
Comparison: October 03, 2018

CLINICAL DATA: Questionable sepsis.

EXAM:
PORTABLE CHEST 1 VIEW

[chest ap]
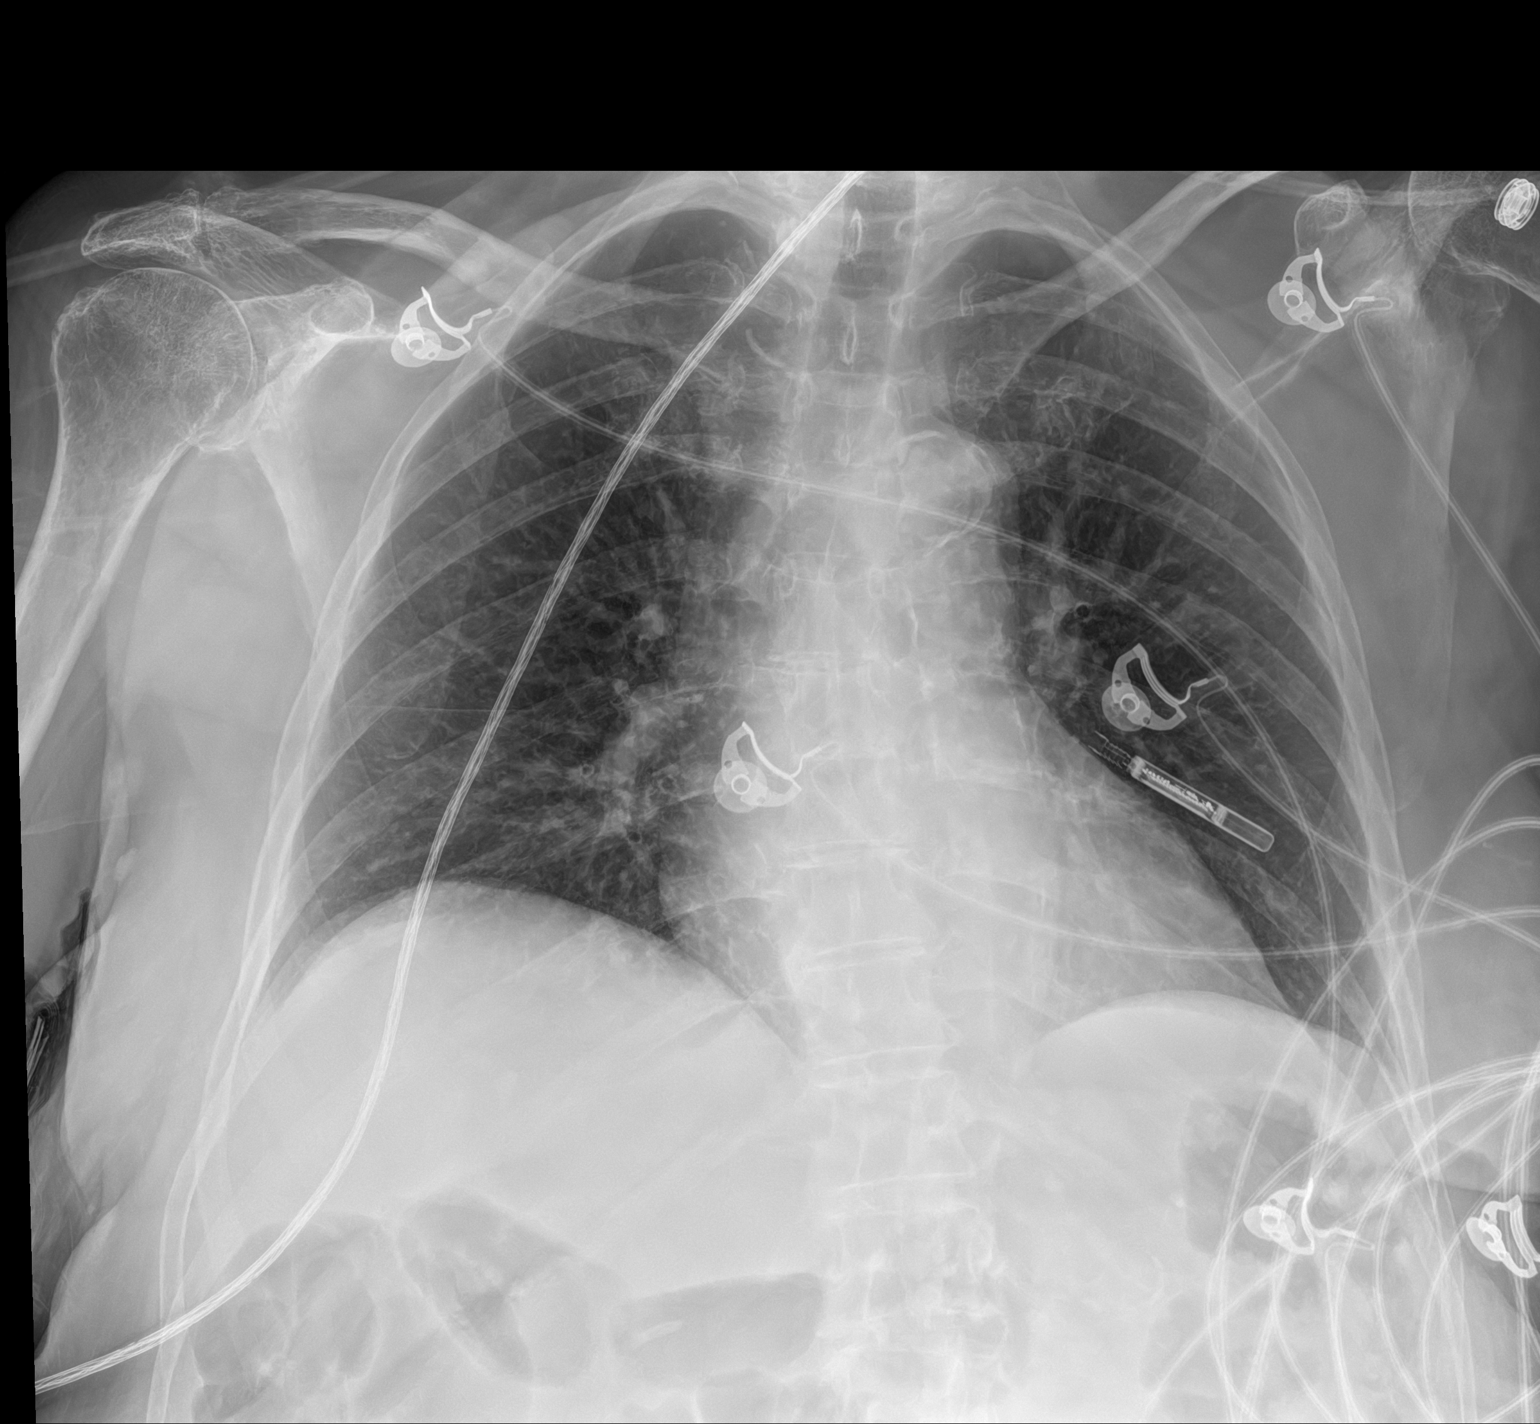

[1 of 1 positions shown; findings below may reference images not displayed]

FINDINGS: The heart size and mediastinal contours are within normal limits.
Both lungs are clear. The visualized skeletal structures are
unremarkable.
IMPRESSION: No active disease.

## 2021-12-30 IMAGING — CT CT HEAD W/O CM
3 of 4 series · 15 of 47 positions shown, 18 images · non-contrast
Comparison: None.

CLINICAL DATA: Seizure

EXAM:
CT HEAD WITHOUT CONTRAST
TECHNIQUE: Contiguous axial images were obtained from the base of the skull
through the vertex without intravenous contrast.

[Series 2: head w o · axial · 0.39mm/px · z∈[+1692,+1812]mm · 9 of 29 slices shown, 12 images]
[im 3/29  brain]
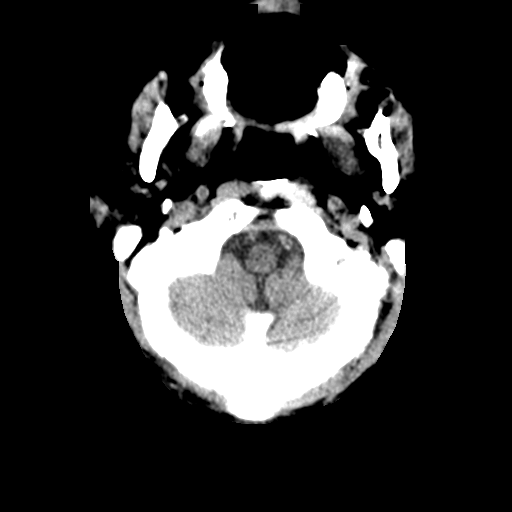
[im 3/29  bone]
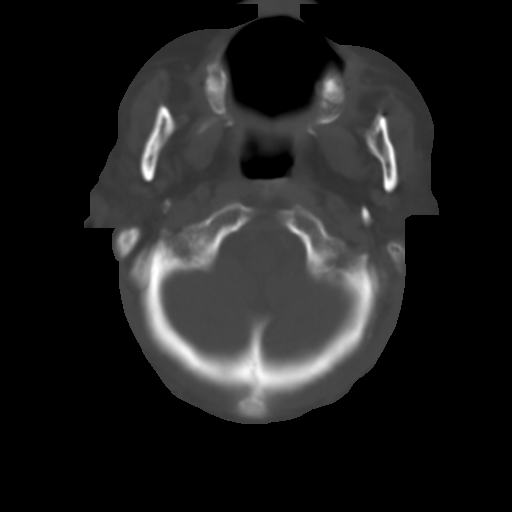
[im 7/29  brain]
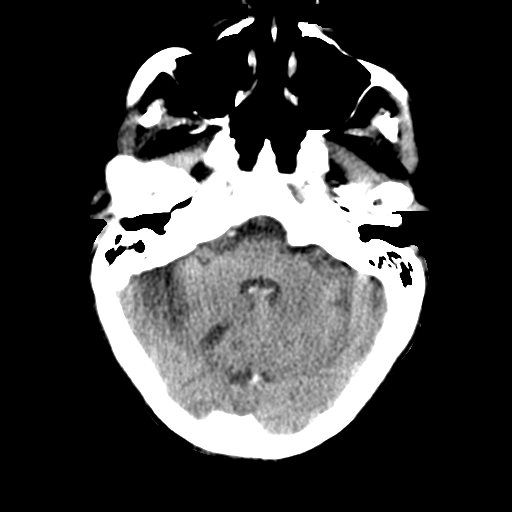
[im 9/29  brain]
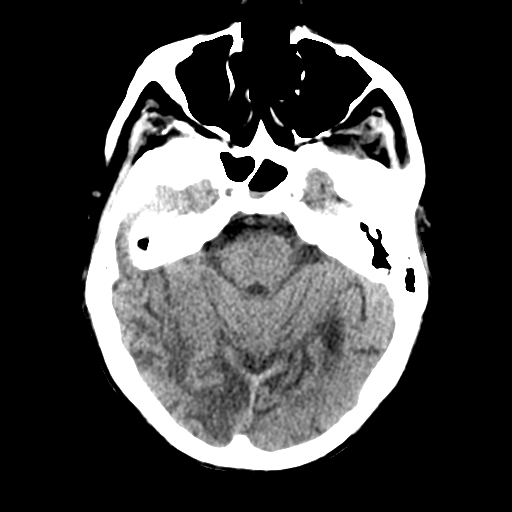
[im 13/29  brain]
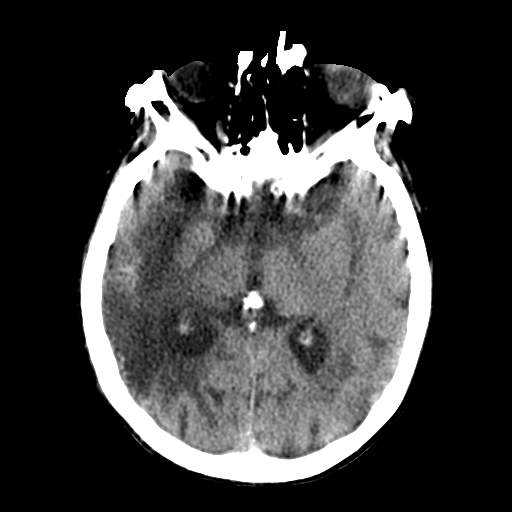
[im 15/29  brain]
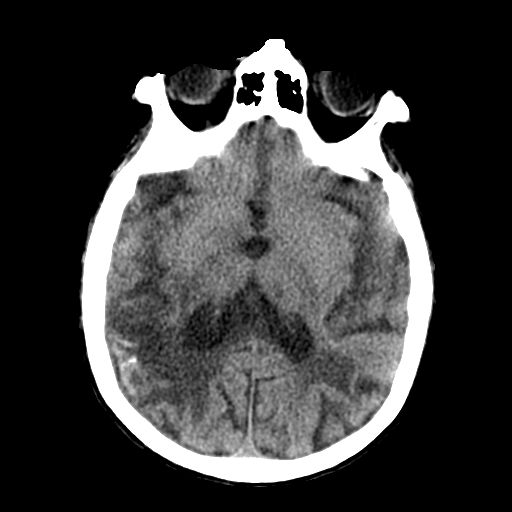
[im 15/29  bone]
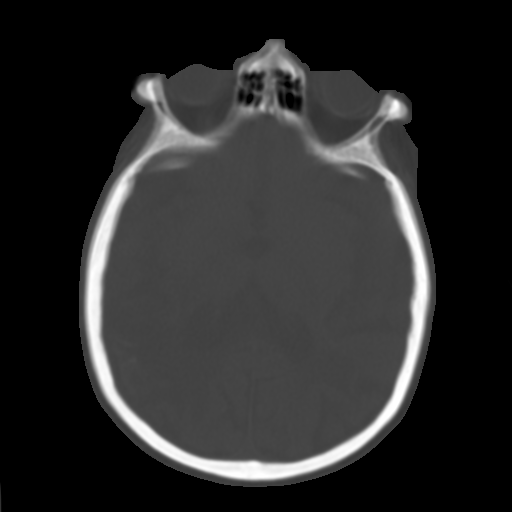
[im 17/29  brain]
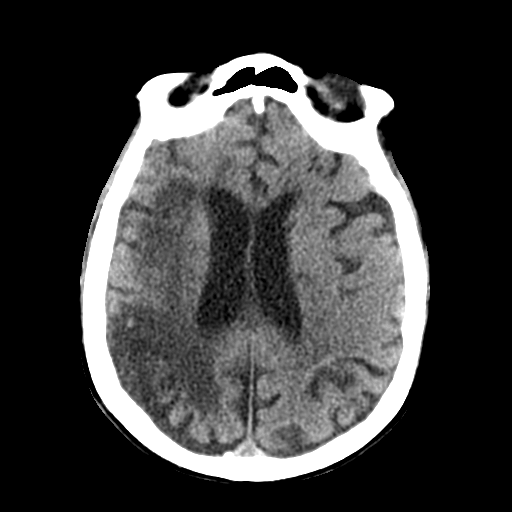
[im 21/29  brain]
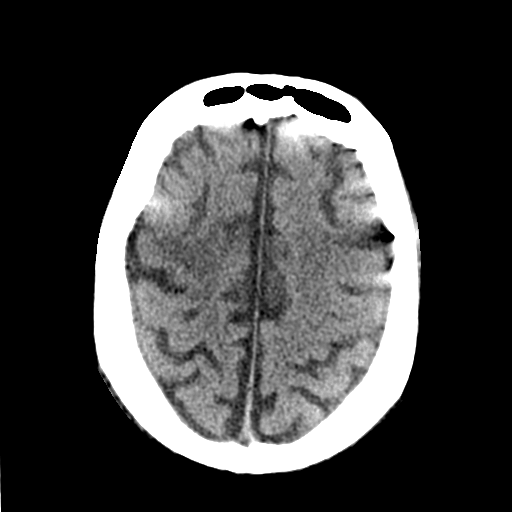
[im 23/29  brain]
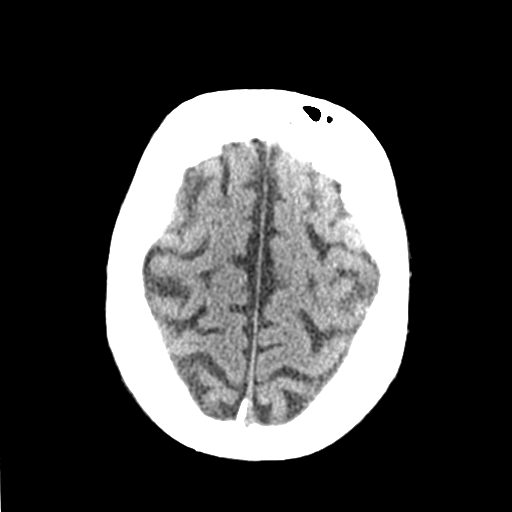
[im 27/29  brain]
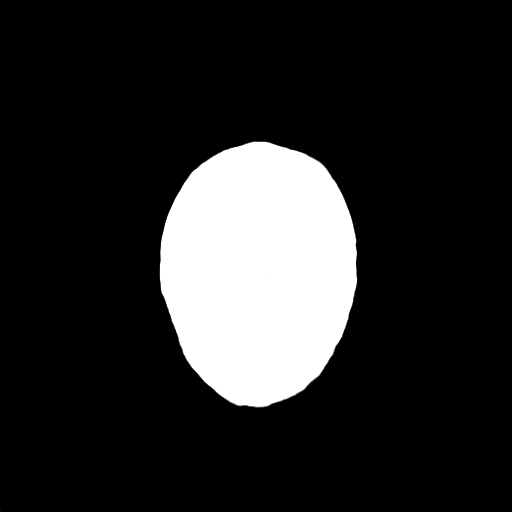
[im 27/29  bone]
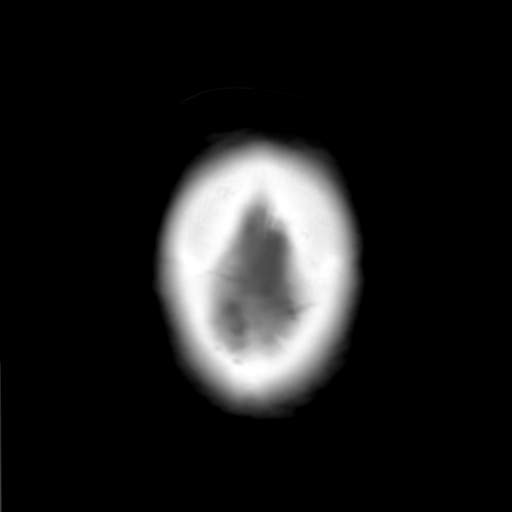

[Series 4: coronal soft · coronal · 0.36mm/px · 3 of 67 slices shown]
[im 23/67  brain]
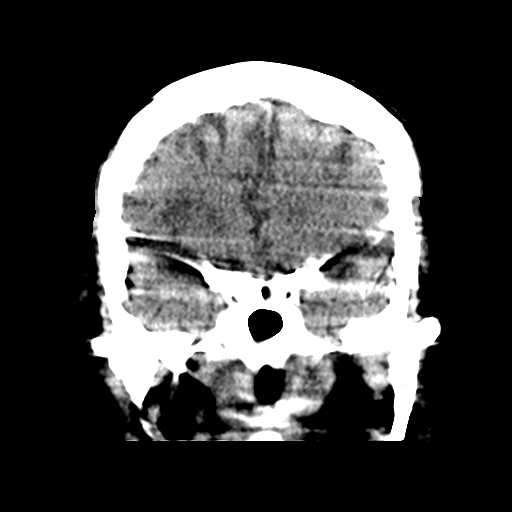
[im 30/67  brain]
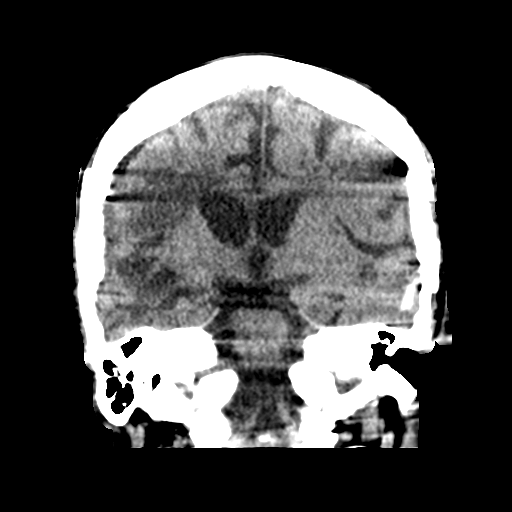
[im 37/67  brain]
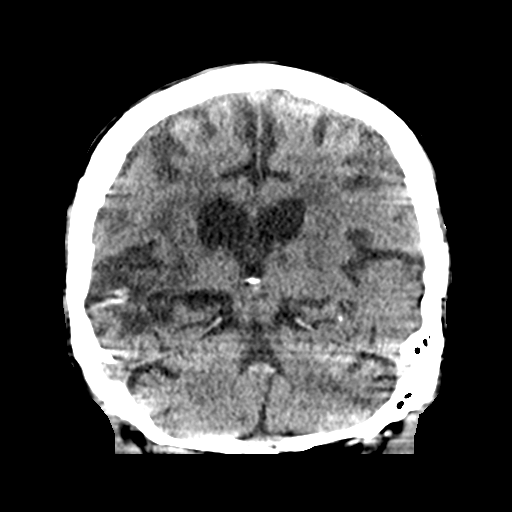

[Series 5: sagittal soft · sagittal · 0.34mm/px · 3 of 67 slices shown]
[im 23/67  brain]
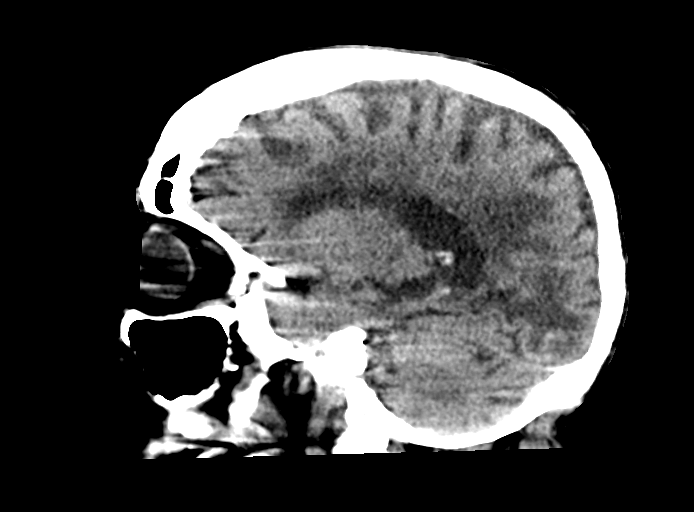
[im 34/67  brain]
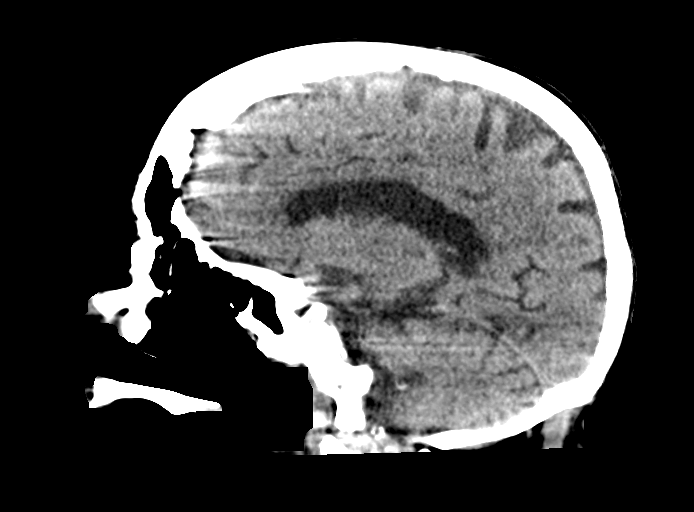
[im 45/67  brain]
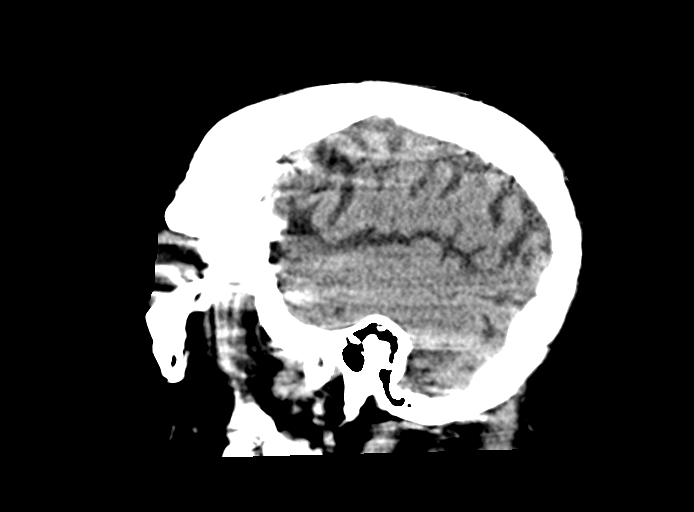

[15 of 47 positions shown; findings below may reference images not displayed]

FINDINGS: Brain: Old right MCA and PCA territory infarcts. Old left posterior
parietal infarct. No acute hemorrhage. There is cortical laminar
necrosis at the posterior right parietal lobe, unchanged. There are
multiple old small vessel infarcts of the cerebellum.

Vascular: No abnormal hyperdensity of the major intracranial
arteries or dural venous sinuses. No intracranial atherosclerosis.

Skull: The visualized skull base, calvarium and extracranial soft
tissues are normal.

Sinuses/Orbits: No fluid levels or advanced mucosal thickening of
the visualized paranasal sinuses. No mastoid or middle ear effusion.
The orbits are normal.
IMPRESSION: 1. No acute intracranial abnormality.
2. Old right MCA and PCA territory infarcts and multiple old small
vessel infarcts of the cerebellum.
